# Patient Record
Sex: Male | Born: 1950 | Race: White | Hispanic: No | Marital: Married | Smoking: Current every day smoker
Health system: Southern US, Community
[De-identification: ages and names within clinical notes are randomized; demographics above are authoritative.]

## PROBLEM LIST (undated history)

## (undated) DIAGNOSIS — I82409 Acute embolism and thrombosis of unspecified deep veins of unspecified lower extremity: Secondary | ICD-10-CM

## (undated) DIAGNOSIS — F411 Generalized anxiety disorder: Secondary | ICD-10-CM

## (undated) DIAGNOSIS — I10 Essential (primary) hypertension: Secondary | ICD-10-CM

## (undated) DIAGNOSIS — Z8719 Personal history of other diseases of the digestive system: Secondary | ICD-10-CM

## (undated) DIAGNOSIS — E785 Hyperlipidemia, unspecified: Secondary | ICD-10-CM

## (undated) DIAGNOSIS — G43909 Migraine, unspecified, not intractable, without status migrainosus: Secondary | ICD-10-CM

## (undated) DIAGNOSIS — L989 Disorder of the skin and subcutaneous tissue, unspecified: Secondary | ICD-10-CM

## (undated) DIAGNOSIS — I739 Peripheral vascular disease, unspecified: Secondary | ICD-10-CM

## (undated) DIAGNOSIS — M545 Low back pain, unspecified: Secondary | ICD-10-CM

## (undated) DIAGNOSIS — L03116 Cellulitis of left lower limb: Secondary | ICD-10-CM

## (undated) DIAGNOSIS — L02419 Cutaneous abscess of limb, unspecified: Secondary | ICD-10-CM

## (undated) DIAGNOSIS — K5730 Diverticulosis of large intestine without perforation or abscess without bleeding: Secondary | ICD-10-CM

## (undated) DIAGNOSIS — G4733 Obstructive sleep apnea (adult) (pediatric): Secondary | ICD-10-CM

## (undated) DIAGNOSIS — M1990 Unspecified osteoarthritis, unspecified site: Secondary | ICD-10-CM

## (undated) DIAGNOSIS — T4145XA Adverse effect of unspecified anesthetic, initial encounter: Secondary | ICD-10-CM

## (undated) DIAGNOSIS — J449 Chronic obstructive pulmonary disease, unspecified: Secondary | ICD-10-CM

## (undated) DIAGNOSIS — G4700 Insomnia, unspecified: Secondary | ICD-10-CM

## (undated) DIAGNOSIS — G8929 Other chronic pain: Secondary | ICD-10-CM

## (undated) DIAGNOSIS — I219 Acute myocardial infarction, unspecified: Secondary | ICD-10-CM

## (undated) DIAGNOSIS — K603 Anal fistula: Secondary | ICD-10-CM

## (undated) DIAGNOSIS — T8859XA Other complications of anesthesia, initial encounter: Secondary | ICD-10-CM

## (undated) DIAGNOSIS — L03119 Cellulitis of unspecified part of limb: Secondary | ICD-10-CM

## (undated) DIAGNOSIS — J309 Allergic rhinitis, unspecified: Secondary | ICD-10-CM

## (undated) DIAGNOSIS — Z86010 Personal history of colonic polyps: Secondary | ICD-10-CM

## (undated) DIAGNOSIS — E119 Type 2 diabetes mellitus without complications: Secondary | ICD-10-CM

## (undated) DIAGNOSIS — I2510 Atherosclerotic heart disease of native coronary artery without angina pectoris: Secondary | ICD-10-CM

## (undated) DIAGNOSIS — R011 Cardiac murmur, unspecified: Secondary | ICD-10-CM

## (undated) DIAGNOSIS — F329 Major depressive disorder, single episode, unspecified: Secondary | ICD-10-CM

## (undated) DIAGNOSIS — I509 Heart failure, unspecified: Secondary | ICD-10-CM

## (undated) DIAGNOSIS — I639 Cerebral infarction, unspecified: Secondary | ICD-10-CM

## (undated) HISTORY — DX: Type 2 diabetes mellitus without complications: E11.9

## (undated) HISTORY — DX: Personal history of colonic polyps: Z86.010

## (undated) HISTORY — DX: Acute embolism and thrombosis of unspecified deep veins of unspecified lower extremity: I82.409

## (undated) HISTORY — PX: BACK SURGERY: SHX140

## (undated) HISTORY — DX: Insomnia, unspecified: G47.00

## (undated) HISTORY — DX: Personal history of other diseases of the digestive system: Z87.19

## (undated) HISTORY — DX: Cutaneous abscess of limb, unspecified: L02.419

## (undated) HISTORY — DX: Chronic obstructive pulmonary disease, unspecified: J44.9

## (undated) HISTORY — DX: Anal fistula: K60.3

## (undated) HISTORY — DX: Generalized anxiety disorder: F41.1

## (undated) HISTORY — DX: Hyperlipidemia, unspecified: E78.5

## (undated) HISTORY — DX: Cellulitis of unspecified part of limb: L03.119

## (undated) HISTORY — DX: Acute myocardial infarction, unspecified: I21.9

## (undated) HISTORY — DX: Allergic rhinitis, unspecified: J30.9

## (undated) HISTORY — DX: Disorder of the skin and subcutaneous tissue, unspecified: L98.9

## (undated) HISTORY — DX: Peripheral vascular disease, unspecified: I73.9

## (undated) HISTORY — DX: Morbid (severe) obesity due to excess calories: E66.01

## (undated) HISTORY — DX: Essential (primary) hypertension: I10

## (undated) HISTORY — DX: Major depressive disorder, single episode, unspecified: F32.9

## (undated) HISTORY — DX: Diverticulosis of large intestine without perforation or abscess without bleeding: K57.30

---

## 1983-08-22 HISTORY — PX: ANTERIOR CERVICAL DECOMP/DISCECTOMY FUSION: SHX1161

## 1988-08-21 HISTORY — PX: KNEE ARTHROSCOPY: SHX127

## 1989-04-21 DIAGNOSIS — I639 Cerebral infarction, unspecified: Secondary | ICD-10-CM

## 1989-04-21 HISTORY — DX: Cerebral infarction, unspecified: I63.9

## 1989-04-21 HISTORY — PX: LUMBAR LAMINECTOMY: SHX95

## 1997-08-21 HISTORY — PX: LIGAMENT REPAIR: SHX5444

## 1998-12-27 ENCOUNTER — Ambulatory Visit (HOSPITAL_COMMUNITY): Admission: RE | Admit: 1998-12-27 | Discharge: 1998-12-27 | Payer: Self-pay | Admitting: Orthopedic Surgery

## 1999-03-08 ENCOUNTER — Encounter: Payer: Self-pay | Admitting: Orthopedic Surgery

## 1999-03-08 ENCOUNTER — Ambulatory Visit (HOSPITAL_COMMUNITY): Admission: RE | Admit: 1999-03-08 | Discharge: 1999-03-08 | Payer: Self-pay | Admitting: Orthopedic Surgery

## 1999-11-17 ENCOUNTER — Encounter: Admission: RE | Admit: 1999-11-17 | Discharge: 1999-11-17 | Payer: Self-pay | Admitting: Hematology and Oncology

## 2001-03-20 ENCOUNTER — Encounter: Admission: RE | Admit: 2001-03-20 | Discharge: 2001-03-20 | Payer: Self-pay | Admitting: Internal Medicine

## 2001-03-28 ENCOUNTER — Encounter: Admission: RE | Admit: 2001-03-28 | Discharge: 2001-03-28 | Payer: Self-pay | Admitting: Internal Medicine

## 2001-04-23 ENCOUNTER — Encounter: Admission: RE | Admit: 2001-04-23 | Discharge: 2001-04-23 | Payer: Self-pay | Admitting: Internal Medicine

## 2001-05-02 ENCOUNTER — Ambulatory Visit (HOSPITAL_COMMUNITY): Admission: RE | Admit: 2001-05-02 | Discharge: 2001-05-02 | Payer: Self-pay | Admitting: Internal Medicine

## 2001-05-02 ENCOUNTER — Encounter: Payer: Self-pay | Admitting: Internal Medicine

## 2001-07-12 ENCOUNTER — Encounter: Admission: RE | Admit: 2001-07-12 | Discharge: 2001-07-12 | Payer: Self-pay | Admitting: Internal Medicine

## 2001-07-12 ENCOUNTER — Ambulatory Visit (HOSPITAL_COMMUNITY): Admission: RE | Admit: 2001-07-12 | Discharge: 2001-07-12 | Payer: Self-pay | Admitting: Internal Medicine

## 2001-07-22 ENCOUNTER — Encounter: Payer: Self-pay | Admitting: Emergency Medicine

## 2001-07-22 ENCOUNTER — Inpatient Hospital Stay (HOSPITAL_COMMUNITY): Admission: EM | Admit: 2001-07-22 | Discharge: 2001-07-25 | Payer: Self-pay | Admitting: Emergency Medicine

## 2001-07-22 ENCOUNTER — Encounter: Payer: Self-pay | Admitting: Internal Medicine

## 2001-07-23 ENCOUNTER — Encounter: Payer: Self-pay | Admitting: Internal Medicine

## 2001-07-24 ENCOUNTER — Encounter: Payer: Self-pay | Admitting: Internal Medicine

## 2001-08-02 ENCOUNTER — Encounter: Admission: RE | Admit: 2001-08-02 | Discharge: 2001-08-02 | Payer: Self-pay | Admitting: Internal Medicine

## 2001-09-10 ENCOUNTER — Encounter: Payer: Self-pay | Admitting: *Deleted

## 2001-09-10 ENCOUNTER — Encounter: Admission: RE | Admit: 2001-09-10 | Discharge: 2001-09-10 | Payer: Self-pay | Admitting: *Deleted

## 2001-09-24 ENCOUNTER — Encounter: Payer: Self-pay | Admitting: *Deleted

## 2001-09-24 ENCOUNTER — Encounter: Admission: RE | Admit: 2001-09-24 | Discharge: 2001-09-24 | Payer: Self-pay | Admitting: *Deleted

## 2001-10-24 ENCOUNTER — Emergency Department (HOSPITAL_COMMUNITY): Admission: EM | Admit: 2001-10-24 | Discharge: 2001-10-24 | Payer: Self-pay

## 2001-11-02 ENCOUNTER — Emergency Department (HOSPITAL_COMMUNITY): Admission: EM | Admit: 2001-11-02 | Discharge: 2001-11-02 | Payer: Self-pay

## 2001-11-12 ENCOUNTER — Encounter (HOSPITAL_COMMUNITY): Admission: RE | Admit: 2001-11-12 | Discharge: 2001-11-13 | Payer: Self-pay | Admitting: *Deleted

## 2001-11-12 ENCOUNTER — Encounter: Payer: Self-pay | Admitting: *Deleted

## 2001-11-15 ENCOUNTER — Encounter: Admission: RE | Admit: 2001-11-15 | Discharge: 2001-11-15 | Payer: Self-pay | Admitting: Internal Medicine

## 2001-11-26 ENCOUNTER — Encounter: Payer: Self-pay | Admitting: *Deleted

## 2001-11-26 ENCOUNTER — Ambulatory Visit (HOSPITAL_COMMUNITY): Admission: RE | Admit: 2001-11-26 | Discharge: 2001-11-27 | Payer: Self-pay | Admitting: *Deleted

## 2001-12-25 ENCOUNTER — Emergency Department (HOSPITAL_COMMUNITY): Admission: EM | Admit: 2001-12-25 | Discharge: 2001-12-25 | Payer: Self-pay | Admitting: Emergency Medicine

## 2002-01-08 ENCOUNTER — Encounter: Admission: RE | Admit: 2002-01-08 | Discharge: 2002-01-08 | Payer: Self-pay | Admitting: Internal Medicine

## 2002-01-23 ENCOUNTER — Encounter: Admission: RE | Admit: 2002-01-23 | Discharge: 2002-03-06 | Payer: Self-pay | Admitting: *Deleted

## 2002-04-03 ENCOUNTER — Encounter: Payer: Self-pay | Admitting: *Deleted

## 2002-04-03 ENCOUNTER — Ambulatory Visit (HOSPITAL_COMMUNITY): Admission: RE | Admit: 2002-04-03 | Discharge: 2002-04-03 | Payer: Self-pay | Admitting: *Deleted

## 2002-04-08 ENCOUNTER — Ambulatory Visit (HOSPITAL_COMMUNITY): Admission: RE | Admit: 2002-04-08 | Discharge: 2002-04-09 | Payer: Self-pay | Admitting: *Deleted

## 2002-04-08 ENCOUNTER — Encounter: Payer: Self-pay | Admitting: *Deleted

## 2002-04-29 ENCOUNTER — Encounter: Admission: RE | Admit: 2002-04-29 | Discharge: 2002-04-29 | Payer: Self-pay | Admitting: Internal Medicine

## 2002-06-02 ENCOUNTER — Encounter: Admission: RE | Admit: 2002-06-02 | Discharge: 2002-06-02 | Payer: Self-pay | Admitting: Internal Medicine

## 2002-06-23 ENCOUNTER — Encounter: Admission: RE | Admit: 2002-06-23 | Discharge: 2002-06-23 | Payer: Self-pay | Admitting: Internal Medicine

## 2002-06-30 ENCOUNTER — Encounter: Payer: Self-pay | Admitting: Cardiology

## 2002-06-30 ENCOUNTER — Ambulatory Visit (HOSPITAL_COMMUNITY): Admission: RE | Admit: 2002-06-30 | Discharge: 2002-06-30 | Payer: Self-pay | Admitting: Cardiology

## 2002-10-01 ENCOUNTER — Ambulatory Visit (HOSPITAL_COMMUNITY): Admission: RE | Admit: 2002-10-01 | Discharge: 2002-10-01 | Payer: Self-pay | Admitting: *Deleted

## 2002-10-01 ENCOUNTER — Encounter: Payer: Self-pay | Admitting: *Deleted

## 2002-10-23 ENCOUNTER — Encounter: Payer: Self-pay | Admitting: Neurosurgery

## 2002-10-27 ENCOUNTER — Encounter: Payer: Self-pay | Admitting: Neurosurgery

## 2002-10-27 ENCOUNTER — Inpatient Hospital Stay (HOSPITAL_COMMUNITY): Admission: RE | Admit: 2002-10-27 | Discharge: 2002-10-28 | Payer: Self-pay | Admitting: Neurosurgery

## 2002-12-04 ENCOUNTER — Encounter: Admission: RE | Admit: 2002-12-04 | Discharge: 2002-12-04 | Payer: Self-pay | Admitting: Neurosurgery

## 2002-12-04 ENCOUNTER — Encounter: Payer: Self-pay | Admitting: Neurosurgery

## 2003-02-26 ENCOUNTER — Encounter: Admission: RE | Admit: 2003-02-26 | Discharge: 2003-02-26 | Payer: Self-pay | Admitting: Neurosurgery

## 2003-02-26 ENCOUNTER — Encounter: Payer: Self-pay | Admitting: Neurosurgery

## 2003-04-01 ENCOUNTER — Encounter: Admission: RE | Admit: 2003-04-01 | Discharge: 2003-04-01 | Payer: Self-pay | Admitting: Internal Medicine

## 2003-04-08 ENCOUNTER — Encounter: Admission: RE | Admit: 2003-04-08 | Discharge: 2003-04-08 | Payer: Self-pay | Admitting: Internal Medicine

## 2003-06-01 ENCOUNTER — Encounter: Admission: RE | Admit: 2003-06-01 | Discharge: 2003-06-01 | Payer: Self-pay | Admitting: Internal Medicine

## 2003-09-09 ENCOUNTER — Encounter: Admission: RE | Admit: 2003-09-09 | Discharge: 2003-09-09 | Payer: Self-pay | Admitting: Internal Medicine

## 2003-10-14 ENCOUNTER — Inpatient Hospital Stay (HOSPITAL_COMMUNITY): Admission: EM | Admit: 2003-10-14 | Discharge: 2003-10-15 | Payer: Self-pay | Admitting: Emergency Medicine

## 2003-10-26 ENCOUNTER — Encounter: Admission: RE | Admit: 2003-10-26 | Discharge: 2003-10-26 | Payer: Self-pay | Admitting: Internal Medicine

## 2003-12-10 ENCOUNTER — Encounter: Admission: RE | Admit: 2003-12-10 | Discharge: 2003-12-10 | Payer: Self-pay | Admitting: Internal Medicine

## 2004-06-30 ENCOUNTER — Ambulatory Visit: Payer: Self-pay | Admitting: Internal Medicine

## 2005-04-05 ENCOUNTER — Ambulatory Visit: Payer: Self-pay | Admitting: Internal Medicine

## 2005-04-12 ENCOUNTER — Ambulatory Visit: Payer: Self-pay | Admitting: Internal Medicine

## 2005-06-06 ENCOUNTER — Ambulatory Visit: Payer: Self-pay | Admitting: Internal Medicine

## 2005-07-06 ENCOUNTER — Ambulatory Visit: Payer: Self-pay | Admitting: Internal Medicine

## 2005-07-19 ENCOUNTER — Ambulatory Visit: Payer: Self-pay | Admitting: Internal Medicine

## 2005-07-22 ENCOUNTER — Ambulatory Visit (HOSPITAL_BASED_OUTPATIENT_CLINIC_OR_DEPARTMENT_OTHER): Admission: RE | Admit: 2005-07-22 | Discharge: 2005-07-22 | Payer: Self-pay | Admitting: Internal Medicine

## 2005-07-30 ENCOUNTER — Ambulatory Visit: Payer: Self-pay | Admitting: Internal Medicine

## 2005-08-22 ENCOUNTER — Ambulatory Visit: Payer: Self-pay | Admitting: Internal Medicine

## 2005-09-19 ENCOUNTER — Ambulatory Visit: Payer: Self-pay | Admitting: Internal Medicine

## 2005-10-31 ENCOUNTER — Ambulatory Visit: Payer: Self-pay | Admitting: Internal Medicine

## 2005-12-26 ENCOUNTER — Ambulatory Visit: Payer: Self-pay | Admitting: Internal Medicine

## 2006-06-14 ENCOUNTER — Ambulatory Visit: Payer: Self-pay | Admitting: Internal Medicine

## 2007-06-24 ENCOUNTER — Ambulatory Visit: Payer: Self-pay | Admitting: Internal Medicine

## 2007-06-24 DIAGNOSIS — Z8719 Personal history of other diseases of the digestive system: Secondary | ICD-10-CM | POA: Insufficient documentation

## 2007-06-24 DIAGNOSIS — Z860100 Personal history of colon polyps, unspecified: Secondary | ICD-10-CM

## 2007-06-24 DIAGNOSIS — F329 Major depressive disorder, single episode, unspecified: Secondary | ICD-10-CM

## 2007-06-24 DIAGNOSIS — K5730 Diverticulosis of large intestine without perforation or abscess without bleeding: Secondary | ICD-10-CM

## 2007-06-24 DIAGNOSIS — J4489 Other specified chronic obstructive pulmonary disease: Secondary | ICD-10-CM | POA: Insufficient documentation

## 2007-06-24 DIAGNOSIS — G43009 Migraine without aura, not intractable, without status migrainosus: Secondary | ICD-10-CM | POA: Insufficient documentation

## 2007-06-24 DIAGNOSIS — K603 Anal fistula, unspecified: Secondary | ICD-10-CM

## 2007-06-24 DIAGNOSIS — J309 Allergic rhinitis, unspecified: Secondary | ICD-10-CM

## 2007-06-24 DIAGNOSIS — J449 Chronic obstructive pulmonary disease, unspecified: Secondary | ICD-10-CM

## 2007-06-24 DIAGNOSIS — E785 Hyperlipidemia, unspecified: Secondary | ICD-10-CM | POA: Insufficient documentation

## 2007-06-24 DIAGNOSIS — F411 Generalized anxiety disorder: Secondary | ICD-10-CM

## 2007-06-24 DIAGNOSIS — I739 Peripheral vascular disease, unspecified: Secondary | ICD-10-CM | POA: Insufficient documentation

## 2007-06-24 DIAGNOSIS — G4733 Obstructive sleep apnea (adult) (pediatric): Secondary | ICD-10-CM

## 2007-06-24 DIAGNOSIS — I10 Essential (primary) hypertension: Secondary | ICD-10-CM | POA: Insufficient documentation

## 2007-06-24 DIAGNOSIS — F3289 Other specified depressive episodes: Secondary | ICD-10-CM

## 2007-06-24 DIAGNOSIS — Z8679 Personal history of other diseases of the circulatory system: Secondary | ICD-10-CM | POA: Insufficient documentation

## 2007-06-24 DIAGNOSIS — Z9889 Other specified postprocedural states: Secondary | ICD-10-CM

## 2007-06-24 HISTORY — DX: Anal fistula, unspecified: K60.30

## 2007-06-24 HISTORY — DX: Hyperlipidemia, unspecified: E78.5

## 2007-06-24 HISTORY — DX: Essential (primary) hypertension: I10

## 2007-06-24 HISTORY — DX: Other specified depressive episodes: F32.89

## 2007-06-24 HISTORY — DX: Diverticulosis of large intestine without perforation or abscess without bleeding: K57.30

## 2007-06-24 HISTORY — DX: Peripheral vascular disease, unspecified: I73.9

## 2007-06-24 HISTORY — DX: Allergic rhinitis, unspecified: J30.9

## 2007-06-24 HISTORY — DX: Major depressive disorder, single episode, unspecified: F32.9

## 2007-06-24 HISTORY — DX: Anal fistula: K60.3

## 2007-06-24 HISTORY — DX: Personal history of colonic polyps: Z86.010

## 2007-06-24 HISTORY — DX: Personal history of other diseases of the digestive system: Z87.19

## 2007-06-24 HISTORY — DX: Personal history of colon polyps, unspecified: Z86.0100

## 2007-06-24 HISTORY — DX: Generalized anxiety disorder: F41.1

## 2007-06-26 ENCOUNTER — Telehealth: Payer: Self-pay | Admitting: Internal Medicine

## 2007-06-28 LAB — CONVERTED CEMR LAB
AST: 33 units/L (ref 0–37)
Albumin: 4.1 g/dL (ref 3.5–5.2)
Alkaline Phosphatase: 45 units/L (ref 39–117)
Basophils Absolute: 0.1 10*3/uL (ref 0.0–0.1)
Bilirubin Urine: NEGATIVE
CO2: 27 meq/L (ref 19–32)
Chloride: 105 meq/L (ref 96–112)
Creatinine, Ser: 1 mg/dL (ref 0.4–1.5)
Crystals: NEGATIVE
Eosinophils Absolute: 0.2 10*3/uL (ref 0.0–0.6)
HDL: 22 mg/dL — ABNORMAL LOW (ref >39.0)
MCHC: 34.9 g/dL (ref 30.0–36.0)
MCV: 99.2 fL (ref 78.0–100.0)
Mucus, UA: NEGATIVE
Nitrite: NEGATIVE
PSA: 0.25 ng/mL (ref 0.10–4.00)
Platelets: 171 10*3/uL (ref 150–400)
Potassium: 3.9 meq/L (ref 3.5–5.1)
RBC: 4.49 M/uL (ref 4.22–5.81)
Sodium: 140 meq/L (ref 135–145)
Specific Gravity, Urine: 1.015 (ref 1.000–1.03)
Total Bilirubin: 0.5 mg/dL (ref 0.3–1.2)
Total Protein, Urine: NEGATIVE mg/dL
Total Protein: 7.2 g/dL (ref 6.0–8.3)
Triglycerides: 699 mg/dL (ref 0–149)
Urine Glucose: NEGATIVE mg/dL
Urobilinogen, UA: 0.2 (ref 0.0–1.0)

## 2007-07-01 ENCOUNTER — Ambulatory Visit: Payer: Self-pay

## 2007-07-01 ENCOUNTER — Encounter: Payer: Self-pay | Admitting: Internal Medicine

## 2007-07-05 ENCOUNTER — Encounter: Payer: Self-pay | Admitting: Internal Medicine

## 2007-07-05 ENCOUNTER — Encounter (INDEPENDENT_AMBULATORY_CARE_PROVIDER_SITE_OTHER): Payer: Self-pay | Admitting: *Deleted

## 2007-08-08 ENCOUNTER — Ambulatory Visit: Payer: Self-pay | Admitting: *Deleted

## 2007-08-28 ENCOUNTER — Ambulatory Visit (HOSPITAL_COMMUNITY): Admission: RE | Admit: 2007-08-28 | Discharge: 2007-08-28 | Payer: Self-pay | Admitting: *Deleted

## 2007-08-28 ENCOUNTER — Ambulatory Visit: Payer: Self-pay | Admitting: *Deleted

## 2007-09-26 ENCOUNTER — Ambulatory Visit: Payer: Self-pay | Admitting: *Deleted

## 2007-11-26 ENCOUNTER — Telehealth: Payer: Self-pay | Admitting: Internal Medicine

## 2008-04-16 ENCOUNTER — Ambulatory Visit: Payer: Self-pay | Admitting: *Deleted

## 2008-06-25 ENCOUNTER — Ambulatory Visit: Payer: Self-pay | Admitting: Internal Medicine

## 2008-06-29 ENCOUNTER — Telehealth (INDEPENDENT_AMBULATORY_CARE_PROVIDER_SITE_OTHER): Payer: Self-pay | Admitting: *Deleted

## 2008-06-29 LAB — CONVERTED CEMR LAB
AST: 48 units/L — ABNORMAL HIGH (ref 0–37)
Alkaline Phosphatase: 63 units/L (ref 39–117)
Bilirubin Urine: NEGATIVE
Bilirubin, Direct: 0.2 mg/dL (ref 0.0–0.3)
CO2: 29 meq/L (ref 19–32)
Chloride: 101 meq/L (ref 96–112)
Eosinophils Relative: 2.1 % (ref 0.0–5.0)
GFR calc Af Amer: 99 mL/min
Glucose, Bld: 120 mg/dL — ABNORMAL HIGH (ref 70–99)
HDL: 35.8 mg/dL — ABNORMAL LOW (ref >39.0)
Hemoglobin, Urine: NEGATIVE
Leukocytes, UA: NEGATIVE
Lymphocytes Relative: 31.8 % (ref 12.0–46.0)
Monocytes Absolute: 0.4 10*3/uL (ref 0.1–1.0)
Monocytes Relative: 4.3 % (ref 3.0–12.0)
Neutrophils Relative %: 61.7 % (ref 43.0–77.0)
Nitrite: NEGATIVE
PSA: 0.3 ng/mL (ref 0.10–4.00)
Platelets: 173 10*3/uL (ref 150–400)
Potassium: 4.2 meq/L (ref 3.5–5.1)
RDW: 12.5 % (ref 11.5–14.6)
Sodium: 139 meq/L (ref 135–145)
Total CHOL/HDL Ratio: 5.8
Total Protein: 7.7 g/dL (ref 6.0–8.3)
Triglycerides: 525 mg/dL (ref 0–149)
Urobilinogen, UA: 0.2 (ref 0.0–1.0)
WBC: 8.6 10*3/uL (ref 4.5–10.5)

## 2008-07-21 ENCOUNTER — Ambulatory Visit: Payer: Self-pay | Admitting: Internal Medicine

## 2008-07-21 DIAGNOSIS — E119 Type 2 diabetes mellitus without complications: Secondary | ICD-10-CM

## 2008-08-25 ENCOUNTER — Ambulatory Visit: Payer: Self-pay | Admitting: Internal Medicine

## 2008-08-26 LAB — CONVERTED CEMR LAB
BUN: 9 mg/dL (ref 6–23)
Calcium: 9.9 mg/dL (ref 8.4–10.5)
Direct LDL: 118.9 mg/dL
GFR calc Af Amer: 88 mL/min
Glucose, Bld: 156 mg/dL — ABNORMAL HIGH (ref 70–99)
HDL: 27.9 mg/dL — ABNORMAL LOW (ref >39.0)
Sodium: 141 meq/L (ref 135–145)
Triglycerides: 377 mg/dL (ref 0–149)
VLDL: 75 mg/dL — ABNORMAL HIGH (ref 0–40)

## 2008-09-02 ENCOUNTER — Telehealth: Payer: Self-pay | Admitting: Internal Medicine

## 2008-09-02 ENCOUNTER — Ambulatory Visit: Payer: Self-pay | Admitting: Internal Medicine

## 2008-11-02 ENCOUNTER — Ambulatory Visit: Payer: Self-pay | Admitting: Internal Medicine

## 2008-12-17 ENCOUNTER — Telehealth (INDEPENDENT_AMBULATORY_CARE_PROVIDER_SITE_OTHER): Payer: Self-pay | Admitting: *Deleted

## 2010-01-21 ENCOUNTER — Telehealth: Payer: Self-pay | Admitting: Internal Medicine

## 2010-01-24 ENCOUNTER — Ambulatory Visit: Payer: Self-pay | Admitting: Internal Medicine

## 2010-01-24 DIAGNOSIS — L989 Disorder of the skin and subcutaneous tissue, unspecified: Secondary | ICD-10-CM

## 2010-01-24 DIAGNOSIS — G4700 Insomnia, unspecified: Secondary | ICD-10-CM

## 2010-01-24 HISTORY — DX: Insomnia, unspecified: G47.00

## 2010-01-24 HISTORY — DX: Disorder of the skin and subcutaneous tissue, unspecified: L98.9

## 2010-01-24 LAB — CONVERTED CEMR LAB
Albumin: 4.6 g/dL (ref 3.5–5.2)
Alkaline Phosphatase: 51 units/L (ref 39–117)
Basophils Absolute: 0.1 10*3/uL (ref 0.0–0.1)
Bilirubin Urine: NEGATIVE
Bilirubin, Direct: 0.1 mg/dL (ref 0.0–0.3)
CO2: 27 meq/L (ref 19–32)
Calcium: 9.6 mg/dL (ref 8.4–10.5)
Creatinine, Ser: 0.9 mg/dL (ref 0.4–1.5)
Direct LDL: 101.9 mg/dL
Eosinophils Absolute: 0.3 10*3/uL (ref 0.0–0.7)
Glucose, Bld: 89 mg/dL (ref 70–99)
HDL: 30.8 mg/dL — ABNORMAL LOW (ref >39.00)
Ketones, ur: NEGATIVE mg/dL
Leukocytes, UA: NEGATIVE
Lymphocytes Relative: 29.2 % (ref 12.0–46.0)
MCHC: 34.8 g/dL (ref 30.0–36.0)
Microalb Creat Ratio: 2.1 mg/g (ref 0.0–30.0)
Neutrophils Relative %: 61.1 % (ref 43.0–77.0)
PSA: 0.19 ng/mL (ref 0.10–4.00)
Platelets: 112 10*3/uL — ABNORMAL LOW (ref 150.0–400.0)
RDW: 13.7 % (ref 11.5–14.6)
TSH: 2.89 microintl units/mL (ref 0.35–5.50)
VLDL: 83.4 mg/dL — ABNORMAL HIGH (ref 0.0–40.0)
pH: 6 (ref 5.0–8.0)

## 2010-01-25 LAB — CONVERTED CEMR LAB: Vit D, 25-Hydroxy: 25 ng/mL — ABNORMAL LOW (ref 30–89)

## 2010-02-23 ENCOUNTER — Encounter: Payer: Self-pay | Admitting: Internal Medicine

## 2010-03-07 ENCOUNTER — Telehealth: Payer: Self-pay | Admitting: Internal Medicine

## 2010-03-28 ENCOUNTER — Ambulatory Visit: Payer: Self-pay | Admitting: Internal Medicine

## 2010-03-28 DIAGNOSIS — L02419 Cutaneous abscess of limb, unspecified: Secondary | ICD-10-CM

## 2010-03-28 DIAGNOSIS — L03119 Cellulitis of unspecified part of limb: Secondary | ICD-10-CM

## 2010-03-28 HISTORY — DX: Cellulitis of unspecified part of limb: L02.419

## 2010-04-05 ENCOUNTER — Encounter (HOSPITAL_BASED_OUTPATIENT_CLINIC_OR_DEPARTMENT_OTHER): Admission: RE | Admit: 2010-04-05 | Discharge: 2010-05-23 | Payer: Self-pay | Admitting: General Surgery

## 2010-09-20 NOTE — Progress Notes (Signed)
Summary: denied med  Phone Note Call from Patient   Caller: Patient Summary of Call: Pt left vm requesting call back. want to know why med was denied. Called pt and let him know need to see md have'nt seen him since 11/02/08. Transferred to schedulers to make appt Initial call taken by: Orlan Leavens,  January 21, 2010 2:26 PM

## 2010-09-20 NOTE — Progress Notes (Signed)
Summary: Temazepam  Phone Note Call from Patient Call back at Home Phone [phone]   Summary of Call: Patient left message on triage requesting to go back on a med that was stopped, did not name the med. Lucious Groves CMA  March 07, 2010 1:26 PM   Left message on machine to call back to office. Lucious Groves CMA  March 07, 2010 1:48 PM   Follow-up for Phone Call        Patient rtn call to let me know that he was calling in regards to Temazepam. He has discovered that he cannot do without it and needs refill prescription. Please advise. Follow-up by: Lucious Groves CMA,  March 07, 2010 3:16 PM  Additional Follow-up for Phone Call Additional follow up Details #1::        done hardcopy to LIM side B - dahlia  Additional Follow-up by: Corwin Levins MD,  March 07, 2010 4:42 PM    Additional Follow-up for Phone Call Additional follow up Details #2::    Patient notified and he uses [facility]. Removed [facility] per patient request. Follow-up by: Lucious Groves CMA,  March 07, 2010 4:47 PM  New/Updated Medications: TEMAZEPAM 30 MG CAPS (TEMAZEPAM) 1 by mouth at bedtime as needed Prescriptions: TEMAZEPAM 30 MG CAPS (TEMAZEPAM) 1 by mouth at bedtime as needed  #30 x 2   Entered and Authorized by:   Corwin Levins MD   Signed by:   Corwin Levins MD on 03/07/2010   Method used:   Print then Give to Patient   RxID:   [id number]

## 2010-09-20 NOTE — Assessment & Plan Note (Signed)
Summary: FU ON MEDS /NWS   Vital Signs:  Patient profile:   60 year old male Height:      69 inches Weight:      296.75 pounds BMI:     43.98 O2 Sat:      95 % on Room air Temp:     97.9 degrees F oral Pulse rate:   86 / minute BP sitting:   140 / 80  (left arm) Cuff size:   large  Vitals Entered ByZella Ball Ewing (January 24, 2010 9:24 AM)  O2 Flow:  Room air  CC: Followup on Medications/RE, Back Pain   CC:  Followup on Medications/RE and Back Pain.  History of Present Illness: overall doing well, lost wt about 8 to 10 lbs  in the past yr with better diet mostly;  Pt denies CP, sob, doe, wheezing, orthopnea, pnd, worsening LE edema, palps, dizziness or syncope  Pt denies new neuro symptoms such as headache, facial or extremity weakness Pt denies polydipsia, polyuria, or low sugar symptoms such as shakiness improved with eating.  Overall good compliance with meds, trying to follow low chol, DM diet, wt stable, little excercise however  Temazepam not working as well as it used to.  Denies worsening depressive symtpoms or suicida;l ideation, or panic.  Some increased stress recently - nephew lost his job and cant afford to help him.   Thinks getting to sleep is ok for now, might consider different med in the future.   Also Dr hall/derm in the past  - has several new lesions    Preventive Screening-Counseling & Management      Drug Use:  no.    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 1po Once Daily 2)  Benazepril Hcl 40 Mg Tabs (Benazepril Hcl) .... Take 1 Tablet By Mouth Once A Day 3)  Labetalol Hcl 300 Mg Tabs (Labetalol Hcl) .... Take 1 Tablet By Mouth Twice A Day 4)  Simvastatin 80 Mg Tabs (Simvastatin) .Marland Kitchen.. 1 Po Once Daily 5)  Temazepam 30 Mg  Caps (Temazepam) .Marland Kitchen.. 1 By Mouth At Bedtime As Needed - Needs Return Office Visit For Further Refills 6)  Adult Aspirin Ec Low Strength 81 Mg Tbec (Aspirin) .Marland Kitchen.. 1 Po Once Daily 7)  Glimepiride 4 Mg Tabs (Glimepiride) .... 2 Po Once Daily 8)  Metformin Hcl 500 Mg Tabs (Metformin Hcl) .... 4 By Mouth Once Daily  Current Medications (verified): 1)  Amlodipine Besylate 10 Mg Tabs (Amlodipine Besylate) .Marland Kitchen.. 1po Once Daily 2)  Benazepril Hcl 40 Mg Tabs (Benazepril Hcl) .... Take 1 Tablet By Mouth Once A Day 3)  Labetalol Hcl 300 Mg Tabs (Labetalol Hcl) .... Take 1 Tablet By Mouth  Twice A Day 4)  Simvastatin 80 Mg Tabs (Simvastatin) .Marland Kitchen.. 1 Po Once Daily 5)  Adult Aspirin Ec Low Strength 81 Mg Tbec (Aspirin) .Marland Kitchen.. 1 Po Once Daily 6)  Glimepiride 4 Mg Tabs (Glimepiride) .... 2 Po Once Daily 7)  Metformin Hcl 500 Mg Tabs (Metformin Hcl) .... 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 c-spine fusion after job-related injury 1985 bilat knee surgury 1990 hand surgury 1999 Lumbar laminectomy  Family History: Reviewed history from 06/24/2007 and no changes required. Family History Lung cancer - mother father with copd, died with suicide both maternal grandparents with heart dz  Social History: Reviewed history from 06/25/2008 and no changes required. 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         all otherwise negative per pt -    has stable LLE claudication with ambulation  Physical Exam  General:  alert and overweight-appearing.   Head:  normocephalic and atraumatic.   Eyes:  vision grossly intact, pupils equal, and pupils round.   Ears:  R ear normal and L ear normal.   Nose:  no external deformity and no nasal discharge.   Mouth:  no gingival abnormalities and pharynx pink and moist.   Neck:  supple and no masses.   Lungs:  normal respiratory effort, R decreased breath sounds, and L decreased  breath sounds.   Heart:  normal rate and regular rhythm.   Abdomen:  soft, non-tender, and normal bowel sounds.   Msk:  no joint tenderness and no joint swelling.   Extremities:  trace edema bilat, no erythema, no ulcers Neurologic:  cranial nerves II-XII intact and strength normal in all extremities.   Skin:  color normal.  , has several ? keratotic lesions to the legs, mid abdomen Psych:  not depressed appearing and moderately anxious.     Impression & Recommendations:  Problem # 1:  Preventive Health Care (ICD-V70.0)  Overall doing well, age appropriate education and counseling updated and referral for appropriate preventive services done unless declined, immunizations up to date or declined, diet counseling done if overweight, urged to quit smoking if smokes , most recent labs reviewed and current ordered if appropriate, ecg reviewed or declined (interpretation per ECG scanned in the EMR if done); information regarding Medicare Prevention requirements given if appropriate; speciality  referrals updated as appropriate   Orders: Gastroenterology Referral (GI) TLB-BMP (Basic Metabolic Panel-BMET) (80048-METABOL) TLB-CBC Platelet - w/Differential (85025-CBCD) TLB-Hepatic/Liver Function Pnl (80076-HEPATIC) TLB-Lipid Panel (80061-LIPID) TLB-TSH (Thyroid Stimulating Hormone) (84443-TSH) TLB-PSA (Prostate Specific Antigen) (84153-PSA) TLB-Udip ONLY (81003-UDIP) T-Vitamin D (25-Hydroxy) (84166-06301)  Problem # 2:  INSOMNIA-SLEEP DISORDER-UNSPEC (ICD-780.52)  The following medications were removed from the medication list:    Temazepam 30 Mg Caps (Temazepam) .Marland Kitchen... 1 by mouth at bedtime as needed - needs return office visit for further refills ok for now meds for now per pt  Problem # 3:  DIABETES MELLITUS, TYPE II (ICD-250.00)  His updated medication list for this problem includes:    Benazepril Hcl 40 Mg Tabs (Benazepril hcl) .Marland Kitchen... Take 1 tablet by mouth once a day    Adult Aspirin Ec  Low Strength 81 Mg Tbec (Aspirin) .Marland Kitchen... 1 po once daily    Glimepiride 4 Mg Tabs (Glimepiride) .Marland Kitchen... 2 po once daily    Metformin Hcl 500 Mg Tabs (Metformin hcl) .Marland KitchenMarland KitchenMarland KitchenMarland Kitchen 4 by mouth once daily  Labs Reviewed: Creat: 1.1 (08/25/2008)    Reviewed HgBA1c results: 7.6 (08/25/2008)  7.8 (06/25/2008) stable overall by hx and exam, ok to continue meds/tx as is, Pt to cont DM diet, excercise, wt loss efforts; to check labs today   Orders: TLB-Microalbumin/Creat Ratio, Urine (82043-MALB) TLB-A1C / Hgb A1C (Glycohemoglobin) (83036-A1C)  Problem # 4:  HYPERTENSION (ICD-401.9)  His updated medication list for this problem includes:    Amlodipine Besylate 10 Mg Tabs (Amlodipine besylate) .Marland Kitchen... 1po once daily    Benazepril Hcl 40 Mg Tabs (Benazepril hcl) .Marland Kitchen... Take 1 tablet by mouth once a day    Labetalol Hcl 300 Mg Tabs (Labetalol hcl) .Marland Kitchen... Take 1 tablet by mouth twice a day  BP today: 140/80 Prior BP: 134/72 (11/02/2008)  Labs Reviewed: K+: 4.1 (08/25/2008) Creat: : 1.1 (08/25/2008)   Chol: 202 (08/25/2008)   HDL: 27.9 (08/25/2008)   LDL: DEL (08/25/2008)   TG: 377 (08/25/2008) borderline elev today - Continue all previous medications as before this visit , pt declines change today  Problem # 5:  HYPERLIPIDEMIA (ICD-272.4)  His updated medication list for this problem includes:    Simvastatin 80 Mg Tabs (Simvastatin) .Marland Kitchen... 1 po once daily  Labs Reviewed: SGOT: 48 (06/25/2008)   SGPT: 42 (06/25/2008)   HDL:27.9 (08/25/2008), 35.8 (06/25/2008)  LDL:DEL (08/25/2008), DEL (06/25/2008)  Chol:202 (08/25/2008), 208 (06/25/2008)  Trig:377 (08/25/2008), 525 (06/25/2008) stable overall by hx and exam, ok to continue meds/tx as is . Pt to continue diet efforts, good med tolerance; to check labs - goal LDL less than 70   Problem # 6:  SKIN LESION (ICD-709.9)  ok to refer back to Dr Margo Aye   Orders: Dermatology Referral (Derma)  Complete Medication List: 1)  Amlodipine Besylate 10 Mg Tabs  (Amlodipine besylate) .Marland Kitchen.. 1po once daily 2)  Benazepril Hcl 40 Mg Tabs (Benazepril hcl) .... Take 1 tablet by mouth once a day 3)  Labetalol Hcl 300 Mg Tabs (Labetalol hcl) .... Take 1 tablet by mouth twice a day 4)  Simvastatin 80 Mg Tabs (Simvastatin) .Marland Kitchen.. 1 po once daily 5)  Adult Aspirin Ec Low Strength 81 Mg Tbec (Aspirin) .Marland Kitchen.. 1 po once daily 6)  Glimepiride 4 Mg Tabs (Glimepiride) .... 2 po once daily 7)  Metformin Hcl 500 Mg Tabs (Metformin hcl) .... 4 by mouth once daily  Patient Instructions: 1)  ok to stop the temazepam 2)  you had the tetanus and pneumonia shots today  3)  Please go to the Lab in the basement for your blood and/or urine tests today 4)  You will be contacted about the referral(s) to: colonoscopy, and Dr Margo Aye - dermatology 5)  Please schedule a follow-up appointment in 6 months.  Appended Document: Immunization Entry      Immunizations Administered:  Tetanus Vaccine:    Vaccine Type: Tdap    Site: left deltoid    Mfr: GlaxoSmithKline    Dose: 0.5 ml    Route: IM    Given by: Robin Ewing    Exp. Date: 06/16/2010    Lot #: [id number]    VIS given: 07/09/07 version given January 24, 2010.  Pneumonia Vaccine:    Vaccine Type: Pneumovax    Site: right deltoid    Mfr: Merck    Dose: 0.5 ml    Route: IM    Given by: Robin Ewing    Exp. Date: 06/15/2011    Lot #: [id number]    VIS given: 03/18/96 version given January 24, 2010.

## 2010-09-20 NOTE — Consult Note (Signed)
Summary: Duard Larsen MD  Duard Larsen MD   Imported By: Sherian Rein 03/14/2010 11:54:29  _____________________________________________________________________  External Attachment:    Type:   Image     Comment:   External Document

## 2010-09-20 NOTE — Assessment & Plan Note (Signed)
Summary: INSECT BITE/NWS   Vital Signs:  Patient profile:   60 year old male Height:      73 inches Weight:      299 pounds BMI:     39.59 O2 Sat:      95 % on Room air Temp:     98.2 degrees F oral Pulse rate:   91 / minute BP sitting:   130 / 72  (left arm) Cuff size:   large  Vitals Entered By: Zella Ball Ewing CMA (AAMA) (March 28, 2010 4:18 PM)  O2 Flow:  Room air CC: Bites on legs, headache/RE   CC:  Bites on legs and headache/RE.  History of Present Illness: here with 3 day new onset gradual but rapid increased area red, swelling, tender and drainage spontaneous last night with some mild pain relief;  no chills and no red streaks;  Pt denies CP, sob, doe, wheezing, orthopnea, pnd, worsening LE edema, palps, dizziness or syncope  Pt denies new neuro symptoms such as headache, facial or extremity weakness  Has occasional headache.  No high fever or chills.  Pt denies polydipsia, polyuria, or low sugar symptoms such as shakiness improved with eating.  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 1po Once Daily 2)  Benazepril Hcl 40 Mg Tabs (Benazepril Hcl) .... Take 1 Tablet By Mouth Once A Day 3)  Labetalol Hcl 300 Mg Tabs (Labetalol Hcl) .... Take 1 Tablet By Mouth Twice A Day 4)  Simvastatin 80 Mg Tabs (Simvastatin) .Marland Kitchen.. 1 Po Once Daily 5)  Adult Aspirin Ec Low Strength 81 Mg Tbec (Aspirin) .Marland Kitchen.. 1 Po Once Daily 6)  Glimepiride 4 Mg Tabs (Glimepiride) .... 2 Po Once Daily 7)  Metformin Hcl 500 Mg Tabs (Metformin Hcl) .... 4 By Mouth Once Daily 8)  Temazepam 30 Mg Caps (Temazepam) .Marland Kitchen.. 1 By Mouth At Bedtime As Needed  Current Medications (verified): 1)  Amlodipine Besylate 10 Mg Tabs (Amlodipine Besylate) .Marland Kitchen.. 1po Once Daily 2)  Benazepril Hcl 40 Mg Tabs (Benazepril Hcl) .... Take 1 Tablet By Mouth Once A Day 3)  Labetalol Hcl 300 Mg Tabs (Labetalol Hcl) .... Take 1 Tablet By Mouth Twice A Day 4)  Simvastatin 80 Mg Tabs (Simvastatin) .Marland Kitchen.. 1 Po Once Daily 5)  Adult Aspirin Ec Low Strength 81 Mg Tbec (Aspirin) .Marland Kitchen.. 1 Po Once Daily 6)  Glimepiride 4 Mg Tabs (Glimepiride) .... 2 Po Once Daily 7)  Metformin Hcl 500 Mg Tabs (  Metformin Hcl) .... 4 By Mouth Once Daily 8)  Temazepam 30 Mg Caps (Temazepam) .Marland Kitchen.. 1 By Mouth At Bedtime As Needed 9)  Doxycycline Hyclate 100 Mg Caps (Doxycycline Hyclate) .Marland Kitchen.. 1 By Mouth Two Times A Day 10)  Oxycodone Hcl 5 Mg Tabs (Oxycodone Hcl) .Marland Kitchen.. 1po Q 6 Hrs As Needed Pain  Allergies (verified): No Known Drug Allergies  Past History:  Past Medical History: Last updated: 01/24/2010 Hyperlipidemia Hypertension OSA -  Cerebrovascular accident, hx of COPD - Dr Maple Hudson Depression Allergic rhinitis Anxiety chronic constipation h/o rectal fistula Diverticulosis, colon Colonic polyps, hx of migraine Peripheral vascular disease - LLE 100% - non surgical candidate per vascular surgury Diabetes  mellitus, type II  Past Surgical History: Last updated: 06/24/2007 c-spine fusion after job-related injury 1985 bilat knee surgury 1990 hand surgury 1999 Lumbar laminectomy  Social History: Last updated: 01/24/2010 Current Smoker Alcohol use-no 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  , not ill appearing Head:  normocephalic and atraumatic.   Eyes:  vision grossly intact, pupils equal, and pupils round.   Ears:  R ear normal and L ear normal.   Nose:  no external deformity and no nasal discharge.   Mouth:  no gingival abnormalities and pharynx pink and moist.   Neck:  supple and no masses.   Lungs:  normal respiratory effort and normal breath sounds.   Heart:  normal rate and regular rhythm.   Extremities:  no edema, no erythema  Skin:  left post calf with 3-4 cm area red, tender, swelling and small drainage with mild fluctuance;  no red streaks or more proximal red, swelling   Impression & Recommendations:  Problem # 1:  CELLULITIS AND ABSCESS OF LEG EXCEPT FOOT (ICD-682.6)  left calf - for pain med, doxy course, and refer to gen surg for appt tomorrow/asap  His updated medication list for this problem includes:    Doxycycline Hyclate 100 Mg Caps (Doxycycline hyclate) .Marland Kitchen... 1 by mouth two times a day  Orders: Surgical Referral (Surgery)  Problem # 2:  DIABETES MELLITUS, TYPE II (ICD-250.00)  His updated medication list for this problem includes:    Benazepril Hcl 40 Mg Tabs (Benazepril hcl) .Marland Kitchen... Take 1 tablet by mouth once a day    Adult Aspirin Ec Low Strength 81 Mg Tbec (Aspirin) .Marland Kitchen... 1 po once daily    Glimepiride 4 Mg Tabs (Glimepiride) .Marland Kitchen... 2 po once daily    Metformin Hcl 500 Mg Tabs (Metformin hcl) .Marland KitchenMarland KitchenMarland KitchenMarland Kitchen 4 by mouth once  daily  Labs Reviewed: Creat: 0.9 (01/24/2010)    Reviewed HgBA1c results: 6.0 (01/24/2010)  7.6 (08/25/2008) stable overall by hx and exam, ok to continue meds/tx as is   Problem # 3:  HYPERTENSION (ICD-401.9)  His updated medication list for this problem includes:    Amlodipine Besylate 10 Mg Tabs (Amlodipine besylate) .Marland Kitchen... 1po once daily    Benazepril Hcl 40 Mg Tabs (Benazepril hcl) .Marland Kitchen... Take 1 tablet by mouth once a day    Labetalol Hcl 300 Mg Tabs (Labetalol hcl) .Marland Kitchen... Take 1 tablet by mouth twice a day  BP today: 130/72 Prior BP: 140/80 (01/24/2010)  Labs Reviewed: K+: 4.2 (01/24/2010) Creat: : 0.9 (01/24/2010)   Chol: 186 (01/24/2010)  HDL: 30.80 (01/24/2010)   LDL: DEL (08/25/2008)   TG: 417.0 (01/24/2010) stable overall by hx and exam, ok to continue meds/tx as is   Complete Medication List: 1)  Amlodipine Besylate 10 Mg Tabs (Amlodipine besylate) .Marland Kitchen.. 1po once daily 2)  Benazepril Hcl 40 Mg Tabs (Benazepril hcl) .... Take 1 tablet by mouth once a day 3)  Labetalol Hcl 300 Mg Tabs (Labetalol hcl) .... Take 1 tablet by mouth twice a day 4)  Simvastatin 80 Mg Tabs (Simvastatin) .Marland Kitchen.. 1 po once daily 5)  Adult Aspirin Ec Low Strength 81 Mg Tbec (Aspirin) .Marland Kitchen.. 1 po once daily 6)  Glimepiride 4 Mg Tabs (Glimepiride) .... 2 po once daily 7)  Metformin Hcl 500 Mg Tabs (Metformin hcl) .... 4 by mouth once daily 8)  Temazepam 30 Mg Caps (Temazepam) .Marland Kitchen.. 1 by mouth at bedtime as needed 9)  Doxycycline Hyclate 100 Mg Caps (Doxycycline hyclate) .Marland Kitchen.. 1 by mouth two times a day 10)  Oxycodone Hcl 5 Mg Tabs (Oxycodone hcl) .Marland Kitchen.. 1po q 6 hrs as needed pain  Patient Instructions: 1)  Please take all new medications as prescribed 2)  Continue all previous medications as before this visit  3)  You will be contacted about the referral(s) to: Gen Surgury for tomorrow 4)  Please schedule a follow-up appointment as needed. Prescriptions: OXYCODONE HCL 5 MG TABS (OXYCODONE HCL) 1po q 6 hrs  as needed pain  #50 x 0   Entered and Authorized by:   Corwin Levins MD   Signed by:   Corwin Levins MD on 03/28/2010   Method used:   Print then Give to Patient   RxID:   [id number] DOXYCYCLINE HYCLATE 100 MG CAPS (DOXYCYCLINE HYCLATE) 1 by mouth two times a day  #20 x 0   Entered and Authorized by:   Corwin Levins MD   Signed by:   Corwin Levins MD on 03/28/2010   Method used:   Print then Give to Patient   RxID:   [id number]

## 2010-10-25 ENCOUNTER — Telehealth: Payer: Self-pay | Admitting: Internal Medicine

## 2010-11-01 NOTE — Progress Notes (Signed)
Summary: ALT med  Phone Note Call from Patient Call back at Home Phone [phone]   Caller: Patient Summary of Call: Pt called stating that Rx forTemezapam is too expensive, $40 per month for him and his wife Eric [name]). Pt is requesting alternative medication. Initial call taken by: Margaret Pyle, CMA,  October 25, 2010 1:50 PM  Follow-up for Phone Call        ok to change to [facility] as needed  - done hardcopy to LIM side B - dahlia  Follow-up by: Corwin Levins MD,  October 25, 2010 5:33 PM  Additional Follow-up for Phone Call Additional follow up Details #1::        Rx faxed to [facility] [address] per pt req Additional Follow-up by: Margaret Pyle, CMA,  October 26, 2010 8:02 AM    New/Updated Medications: ZOLPIDEM TARTRATE 12.5 MG CR-TABS (ZOLPIDEM TARTRATE) 1 by mouth at bedtime as needed Prescriptions: ZOLPIDEM TARTRATE 12.5 MG CR-TABS (ZOLPIDEM TARTRATE) 1 by mouth at bedtime as needed  #30 x 2   Entered and Authorized by:   Corwin Levins MD   Signed by:   Corwin Levins MD on 10/25/2010   Method used:   Print then Give to Patient   RxID:   [id number]

## 2010-11-03 LAB — GLUCOSE, CAPILLARY: Glucose-Capillary: 185 mg/dL — ABNORMAL HIGH (ref 70–99)

## 2011-01-03 NOTE — Procedures (Signed)
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   HISTORY:  Diabetes:  No.  Cardiac:  Yes.  Hypertension:  Yes.  Smoking:  Pack and a half per day.  Previous Surgery:  Occluded left superficial femoral artery and right  superficial femoral artery stenosis by previous angiogram performed  08/28/2007 by Dr. Madilyn Fireman.   RESTING SYSTOLIC PRESSURES: (ABI)                          RIGHT                LEFT  Brachial:               112                  118  Anterior tibial:        130                  60  Posterior tibial:       142 (>1.0)           72 (0.61)  Peroneal:  DOPPLER WAVEFORM ANALYSIS:  Anterior tibial:        Triphasic            Monophasic  Posterior tibial:       Triphasic            Monophasic  Peroneal:   PREVIOUS ABI'S:  Date:  07/01/2007  RIGHT:  >1.0  LEFT:  0.58   IMPRESSION:  ABIs are stable from previous study bilaterally.   ___________________________________________  P. Liliane Bade, M.D.   MC/MEDQ  D:  04/16/2008  T:  04/16/2008  Job:  [id number]

## 2011-01-03 NOTE — Assessment & Plan Note (Signed)
OFFICE VISIT   CASANOVA, SCHURMAN  DOB:  [birth year]                                       09/26/2007  ZOXWR#:[id number]   Patient return to the office today to discuss the results of his lower  extremity arteriography.  He was initially seen in consultation on  08/08/07 with left lower extremity claudication.  The left ABI measured  0.68 and right ABI 1.1.   Arteriography was carried out on 08/28/07.  Revealed long segment left  superficial femoral artery occlusion with reconstitution of the  popliteal artery at the knee joint.  Also, a 40-50% stenosis of the  right superficial femoral artery.   Patient's symptoms at present are that of limiting claudication.  No  night pain or rest pain.  Denies nonhealing ulceration.   PHYSICAL EXAMINATION:  His BP is 161/101, pulse 95 per minute and  regular, respirations 18 per minute, O2 sat 97%.  Lower extremity  evaluation reveals an intact femoral pulse in the left, no palpable  posterior tibial or dorsalis pedis pulses.  Moderate dependent rubor.   I have had a long talk with the patient and his wife about these current  findings.  He has underlying COPD and sleep apnea.  I do think he would  be a relatively high risk operative case and therefore have recommended  we continue with conservative medical therapy.  Instructed regarding  smoking cessation and regular exercise and weight loss.  To return in  six months with a lower extremity Doppler evaluation.   Balinda Quails, M.D.  Electronically Signed   PGH/MEDQ  D:  09/26/2007  T:  09/30/2007  Job:  [id number]

## 2011-01-03 NOTE — Assessment & Plan Note (Signed)
OFFICE VISIT   Eric [name], Eric [name]  DOB:  September 17, 1950                                       04/16/2008  UEAVW#:[id number]   The patient returned to the office today for continued followup of his  claudication.  He has previously undergone arteriography which reveals a  long segment occlusion of the left superficial femoral artery.  He  continues to complain of left calf claudication.  This occurs at one to  two blocks of ambulation.   Unfortunately he is continuing to smoke cigarettes about 1 to 1-1/2  packs daily.   Lower extremity arterial Dopplers reveal ankle brachial indices 1.0 on  the right and 0.61 on the left.   PHYSICAL EXAMINATION:  On evaluation he is obese 60 year old gentleman.  Alert and oriented.  No acute distress.  BP 132/83, pulse 82 per minute.  Lower extremity evaluation reveals intact femoral pulses bilaterally.  His right lower extremity reveals 1+ popliteal, posterior tibial and  dorsalis pedis.  Left lower extremity absent popliteal, posterior tibial  and dorsalis pedis pulses.  No ischemic skin changes noted.   Unfortunately the patient does continue to smoke and I have again  counseled him regarding this problem.  His claudication seems quite  stable at present and he is tolerating this reasonably well.  With his  underlying COPD and sleep apnea I do not feel that surgery would be in  his best interest.  He will continue to exercise regularly and work on  his smoking habit.  To return p.r.n.   Balinda Quails, M.D.  Electronically Signed   PGH/MEDQ  D:  04/16/2008  T:  04/17/2008  Job:  [id number]   cc:   Corwin Levins, MD

## 2011-01-03 NOTE — Consult Note (Signed)
VASCULAR SURGERY CONSULTATION   Eric [name], Eric [name]  DOB:  03/10/1951                                       08/08/2007  OZHYQ#:[id number]   INDICATIONS FOR REFERRAL:  Left lower extremity claudication.   HISTORY:  Mr. Eric [name] is a 60 year old male who describes a history  of left calf claudication occurring over the past 6 months.  This has  become more frequent and severe.  He walks approximately 20 yards now  prior to developing left calf discomfort.  He then rests with decrease  in pain.  He does intermittently get some pain in his left foot at  night.  He denies any pain at rest.  He has noted a cyanotic type  appearance to his foot over the past 6 months.  Related to this also is  some left hip pain with ambulation.   Risk factors for atherosclerotic vascular disease include hypertension  and hyperlipidemia.  He also smokes 2 packs of cigarettes daily.   PAST MEDICAL HISTORY:  1. Hypertension.  2. Hyperlipidemia.  3. COPD.  4. Sleep apnea.  5. Tobacco abuse.  6. Obesity.   SOCIAL HISTORY:  The patient is married with 3 children.  He is now on  longterm disability due to chronic back problems where he has had  multiple operations.  He continues to smoke 2 packs of cigarettes daily.  Does not consume alcohol.   FAMILY HISTORY:  Mother died at age 47 of lung cancer.  Father is  deceased as a result of suicide with a history of COPD.  He has 5  brothers whose health is generally well.  No family history of MI or  stroke.   REVIEW OF SYSTEMS:  This was reviewed per the patient encounter form  today.  He does have chronic problems with weight gain.  Notes chest  pain, tightness, shortness of breath with lying flat, palpitations and  shortness of breath with exertion.  Denies any cough or sputum  production.  Does not use home oxygen.  He has been told he has sleep  apnea but does not tolerate a CPAP mask.  He has chronic constipation  and intermittent  abdominal pain.  He notes urinary frequency.  He does  have some dizziness and headaches.  Chronic joint discomfort.  Suffers  from depression and nervousness.  No change in hearing or eyesight.  No  bleeding disorder.  No rash or ulceration.   MEDICATIONS:  Amlodipine 5 mg daily, benazepril 40 mg daily, Labetalol  300 mg b.i.d., lovastatin 40 mg daily, temazepam 30 mg daily at bedtime.   ALLERGIES:  None.   PHYSICAL EXAMINATION:  General:  An obese 60 year old male appears  approximately his stated age.  Alert and oriented, in no acute distress.  Vital signs:  BP is 136/77, pulse is 94 per minute, respirations 18 per  minute.  HEENT:  Mouth totally clear.  Normocephalic.  Extraocular  movements intact.  Neck:  Supple.  No thyromegaly or adenopathy.  Cardiovascular:  No carotid bruits.  Normal heart sounds.  No murmurs.  No rubs or gallops.  Regular rate and rhythm.  Chest:  Distant breath  sounds.  No rales or rhonchi.  Equal air entry.  Normal percussion.  Abdomen:  Obese, soft, nontender.  No masses or organomegaly.  Bowel  sounds active.  No bruits.  Lower extremities:  2+ femoral pulses  bilaterally.  Absent left popliteal, posterior tibial and dorsalis pedis  pulses.  1+ right popliteal and dorsalis pedis pulse.  1+ ankle edema  bilaterally.  Cyanosis of the left foot.  No ulceration.  Skin:  No rash  or ulceration.  Neurological:  Strength is equal bilaterally.  Normal  gait.  Reflexes intact at 1+ bilaterally.  Cranial nerves are normal.   INVESTIGATIONS:  Lower extremity Doppler evaluation reveals ankle  brachial indices 1.1 on the right leg, 0.58 on the left leg.  Waveforms  reveal a focal stenosis at the proximal right superficial femoral artery  of 50% and an occluded mid to distal left superficial femoral artery  with reconstitution of the popliteal artery.  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   IMPRESSION:  1. Left lower extremity claudication.  2. Hypertension.   3. Hyperlipidemia.  4. Chronic obstructive pulmonary disease.  5. Sleep apnea.  6. Tobacco abuse.  7. Obesity.   RECOMMENDATIONS:  The patient is severely disabled by left lower  extremity claudication.  Early symptoms of possible night pain.  I have  recommended a left lower extremity arteriogram with possible  intervention if an appropriate lesion is identified.  The patient is  agreeable to this.  Scheduled for [facility] August 27, 2006.  Will begin aspirin 325 mg daily and Plavix 75 mg daily prior to the  procedure.   Potential risks of the procedure including bleeding, limb ischemia and  emergency surgery have been reviewed.   Balinda Quails, M.D.  Electronically Signed  PGH/MEDQ  D:  08/08/2007  T:  08/09/2007  Job:  [id number]

## 2011-01-03 NOTE — Op Note (Signed)
NAMEJERIMY, Eric [name]                ACCOUNT NO.:  [id number]   MEDICAL RECORD NO.:  [id number]          PATIENT TYPE:  AMB   LOCATION:  SDS                          FACILITY:  [facility]   PHYSICIAN:  Balinda Quails, M.D.    DATE OF BIRTH:  [birth year]   DATE OF PROCEDURE:  08/28/2007  DATE OF DISCHARGE:                               OPERATIVE REPORT   PHYSICIAN:  Denman George, MD.   DIAGNOSIS:  Left lower extremity claudication.   PROCEDURE:  1. Abdominal aortogram with bilateral lower extremity runoff      arteriography.  2. Star close right common femoral artery.   ACCESS:  Right common femoral artery 5-French sheath.   COMPLICATIONS:  None apparent.   CLINICAL NOTE:  Eric [name] is a 60 year old gentleman with history  obesity, COPD and tobacco use.  Presented with claudication symptoms in  his left lower extremity.  These were severely disabling.  He is brought  to cath lab at this time for diagnostic workup with arteriography and  possible intervention.   PROCEDURE NOTE:  The patient brought to cath lab in stable condition.  Placed in supine position.  Both groins prepped and draped in sterile  fashion.   Skin and subcutaneous tissue right groin instilled 1% Xylocaine.  A  small incision made in the right groin.  Skin and subcutaneous tissues  instilled with 1% Xylocaine.  An 18 gauge needle introduced in right  common femoral artery.  0.035 Wholey guidewire advanced needle into the  mid abdominal aorta.  5-French sheath advanced over the guidewire.  A  pigtail catheter advanced over guidewire to the mid abdominal aorta.   Standard AP mid abdominal aortogram obtained.  This verified widely  patent bilateral renal arteries.  The infrarenal aorta was normal in  caliber.  There was mild to moderate plaque disease of the common iliac  arteries bilaterally without significant stenosis.  The hypogastric and  external iliac arteries were patent bilaterally.   Runoff to the  common femoral level was intact.   Bilateral lower extremity runoff arteriography obtained.  The right leg  revealed a patent profunda and superficial femoral artery.  There was a  mild to moderate stenosis at the adductor canal estimated to be 4-50%.  Popliteal artery patent.  Intact three-vessel runoff of right calf.   Left lower extremity revealed a patent proximal left superficial femoral  artery.  Left profunda femoris artery was patent.  The left superficial  femoral artery occluded at the adductor canal.  There is reconstitution  of the popliteal artery at the knee joint.  The distal popliteal artery  intact and three-vessel runoff present in the left calf.   This anatomy was not felt to be good for percutaneous intervention due  to the position of the occlusion at the left knee joint.   The guidewire reinserted, pigtail catheter removed.   Retrograde right femoral arteriogram obtained.  This revealed the sheath  to be in the right common femoral artery was without significant  disease.  Guidewire reinserted, the sheath removed.  A Star close sheath  placed over the guidewire.  The Star close device advanced through the  sheath.  The device deployed, drawn back into the arterial wall and  deployed.   There were no apparent complications.  The patient tolerated procedure  well.   FINAL IMPRESSION:  1. Single bilateral renal arteries widely patent.  2. Intact aortoiliac segment with mild to moderate disease and no      dominant stenosis.  3. Long segment left superficial femoral artery occlusion with      reconstitution of the popliteal artery at the knee joint.  4. Intact right lower extremity infrainguinal circulation.   RECOMMENDATIONS:  The patient is not a good candidate for endovascular  treatment due to the position of the occlusion and reconstitution of the  popliteal artery at the knee joint.  The patient is not a strong  candidate either for surgery due to severe  COPD and obesity.  This will  be reviewed further with the patient in the office.      Balinda Quails, M.D.  Electronically Signed     PGH/MEDQ  D:  08/28/2007  T:  08/28/2007  Job:  [id number]

## 2011-01-05 ENCOUNTER — Other Ambulatory Visit: Payer: Self-pay | Admitting: Internal Medicine

## 2011-01-06 NOTE — Discharge Summary (Signed)
[place]. [facility]  Patient:    Eric [name], Eric [name] Visit Number: [id number] MRN: [id number]          Service Type: MED Location: [phone] Attending Physician:  Phifer, Trinna Post Dictated by:   Ladell Pier, M.D. Admit Date:  07/22/2001 Discharge Date: 07/25/2001                             Discharge Summary  DISCHARGE DIAGNOSES:  1. Chest pain, rule out myocardial infarction.     a. A 2-D echocardiogram done on July 24, 2001 showed overall left        ventricular systolic function normal, left ventricular ejection        fraction 55-65% with the right ventricles mildly dilated.     b. Pulmonary embolus protocol CT scan done showed no deep venous        thrombosis, negative for pulmonary embolus.  Cardiolite rest stress        test showed large scar involving the inferior wall and inferolateral        wall.  The scar does extend into the inferior aspect of the left        ventricular apex.  No evidence of reversible ischemia.  2. Back pain since June 2002.  Plain films May 02, 2001 showed     degenerative changes in L3-4, L4-5, L5-S1.  Positive for osteophytes plus     positive for minimal disk space narrowing at L3-L4.  MRI done May 02, 2001 showed L4-L5 large central and left paracentral herniated nucleus     pulposus with some caudal extension into the left lateral recess plus mass     effect on L5 nerve root.  Multifactorial central canal and bilateral     lateral recess stenosis L4-L5.  Small disk protrusion posterolaterally on     left at L2-L3 and posterolaterally on the right at L3-L4.  3. Hypertension that is uncontrolled on hydrochlorothiazide 25 mg p.o. q.d.     started on July 12, 2001.  4. Obesity.  June 2002, 240 pounds, currently greater than 300 pounds.  5. Tobacco abuse two packs per day since age 44, recently decreased to one     pack per day.  6. Status post cervical disk fusion surgery in the past done  by Dr. Wyonia Hough.  7. Bilateral knee orthoscopic surgery.  8. Right wrist surgery, tendon/ligament repair secondary to on the job     injury.  9. Hyperglycemia during admission, questionable early type 2 diabetes.  No     history of diabetes in the past, questionable stress response syndrome. 10. Constipation. 11. Hypokalemia.  DISCHARGE MEDICATIONS: 1. Hydrochlorothiazide 25 mg p.o. q.d. 2. Vicodin 5/500 one two to tablets q.6h. p.r.n. for pain.  FOLLOWUP APPOINTMENT:  Patient to follow up with Dr. Bonnell Public on August 02, 2001 and also to follow up with Dr. Sharyn Lull, the cardiologist in three weeks post discharge.  CONSULTANTS:  Cardiology, Dr. Sharyn Lull.  PROCEDURE:  A 2-D echocardiogram and also a Cardiolite rest stress test and also a CT scan of the chest PE protocol.  CHIEF COMPLAINT:  Chest pain.  HISTORY OF PRESENT ILLNESS:  Eric [name] is a 59 year old white male with a past medical history significant for hypertension, obesity, tobacco abuse, and degenerative disk disease status post herniated disk, chronic back pain.  He presented to the emergency department with a three week history  of chest pain at rest that lasted for a few minutes up to 30 minutes.  He stated that until the day of admission his chest pain episodes would change when he moved his shoulders.  He denied any associated shortness of breath, nausea, vomiting, or diaphoresis.  No arm numbness or weakness.  No syncope.  He was seen in the outpatient clinic on September 11, 2000 and started on hydrochlorothiazide.  An EKG done in the clinic showed normal sinus rhythm.  On the day of admission he reported laying down after having decreased appetite all day and suddenly developed a 9/10 chest pain as he saturation up which radiated to the left axilla and was substernal and sharp.  He described the pain as constant and the pain did not change with movement.  He took four baby aspirin.  In the ED was started  on a nitroglycerin drip and his pain decreased to 3/10.  PAST MEDICAL HISTORY:  As per problem list.  MEDICATIONS: 1. Hydrochlorothiazide 25 mg p.o. q.d. 2. Vicodin 5/500 one to two tablets q.6h. p.r.n. back pain.  ALLERGIES:  No known drug allergies.  SOCIAL HISTORY:  He is married and has one son.  Currently unemployed, but previously worked as a Lawyer.  He applied for medicaid and that is pending.  No alcohol use.  He smokes tobacco since age 57.  Smokes two packs per day until recently.  He now smokes a pack per day.  No IV drug use.  FAMILY HISTORY:  No history of MI except for his maternal grandfather died of myocardial infarction.  REVIEW OF SYSTEMS:  Positive for left ankle swelling for two to four weeks secondary to recent injury.  Positive for weight gain of 60 pounds since June 2002.  Positive for constipation.  Positive for tingling in his feet and fingers.  Positive for polyuria, polydipsia, plus occasional dizziness. Negative for vision changes.  Otherwise normal.  PHYSICAL EXAMINATION  VITAL SIGNS:  Blood pressure 154/90, pulse 118, respirations 22, O2 saturation 95% on room air.  GENERAL:  Obese white male in no apparent distress.  HEENT:  Head is normocephalic, atraumatic.  Pupils are equal, round, and reactive to light.  Throat:  Clear.  NECK:  No JVD.  No LAD.  No carotid bruits.  HEART:  Regular rate and rhythm.  No murmurs, rubs, or gallops.  LUNGS:  Distant breath sounds, but clear to auscultation bilaterally.  No wheezes, rhonchi, or rales.  ABDOMEN:  Obese, nontender, firm.  No masses.  No guarding.  EXTREMITIES:  DP pulse 2+ bilaterally.  No clubbing, cyanosis, edema.  NEUROLOGIC:  Nonfocal.  Strength 5/5 in upper extremities.  Sensation intact. Cranial nerves 2-12 grossly intact.  HOSPITAL COURSE: #1 - CHEST PAIN:  Patient was admitted to telemetry bed to rule out MI. Serial enzymes were done which were negative.  EKG was done  which showed no ischemia.  He also received a 2-D echocardiogram which was normal except for  mildly dilated left ventricle.  Spiral CT was also done to rule out the possibility of a PE.  Spiral CT showed no evidence of DVT or pulmonary embolus.  Cardiology was consulted and a Cardiolite rest stress test was done that showed some inferolateral fixed wall motion abnormality that patient probably had some infarct in the past.  Patient was discharged with the intention to follow up with cardiology within the next three weeks. Cardiology will start patient on the necessary medications, beta blockers, ACE inhibitor, and  daily aspirin in conjunction to the other medication that he came in on.  A fasting lipid panel was also done which the results were pending on patients discharge.  Dr. Leanna Sato will follow up with patients laboratories when he presents to the clinic and start him on any antilipid agent if necessary.  #2 - PULMONARY CONGESTION:  The patient had some pulmonary congestion on his chest x-ray.  He has no history of CHF.  Likely secondary to a cardiac event leading to flash pulmonary edema.  The patient was given several small doses of Lasix during his admission course.  #3 - HYPERGLYCEMIA:  Glucose on admission was 180.  CBGs were checked during his admission and his blood sugars remained within normal limits during his hospital stay.  #4 - HYPERTENSION:  He was started on metoprolol and Lotensin during his hospitalization and continued on his hydrochlorothiazide.  #5 - BACK PAIN:  He was given Vicodin p.r.n. for his back pain.  #6 - HYPOKALEMIA:  His potassium was repleted during his hospitalization.  His hypokalemia is most likely secondary to his hydrochlorothiazide use.  #7 - TOBACCO ABUSE:  The patient was counseled on the importance of stopping his tobacco use and was going to consider using the nicotine patch and will follow up with that on discharge.  #8 -  OBESITY:  The patient was also going to work on diet with his primary care physician.  DISCHARGE LABORATORIES:  Cardiolite test shows an EF of 50% and large inferior and inferolateral scar.  Chest x-ray showed stable cardiomegaly and mild interstitial edema pattern.  The spiral CT of the chest was negative for PE and no evidence of DVT.  WBC 7.6, hemoglobin 15, hematocrit 43.7, platelets 208,000.  PTT 56, PT 12.7, INR 0.9.  Sodium 140, potassium 4, chloride 103, CO2 29, glucose 97, BUN 9, creatinine 0.9, calcium 9.2, AST 26, ALT 22, alkaline phosphatase 57, total bilirubin 0.8.  CK 126, 109, 126.  MB 2.6, 1.7, 3.1.  Relative index 2.1, 1.6, 2.5.  Troponin I less than 0.1, less than 0.1, less than 0.1.  Lipid profile: Total cholesterol 224, triglycerides 356, HDL 31, LDL 122. Dictated by:   Ladell Pier, M.D. Attending Physician:  Phifer, Trinna Post DD:  10/17/01 TD:  10/18/01 Job: [id number]

## 2011-01-06 NOTE — Op Note (Signed)
NAME:  Eric [name], Eric [name]                          ACCOUNT NO.:  [id number]   MEDICAL RECORD NO.:  [id number]                   PATIENT TYPE:  OIB   LOCATION:  5020                                 FACILITY:  [facility]   PHYSICIAN:  Reynolds Bowl, M.D.                 DATE OF BIRTH:  26-May-1951   DATE OF PROCEDURE:  DATE OF DISCHARGE:  04/09/2002                                 OPERATIVE REPORT   PREOPERATIVE DIAGNOSIS:  Recurrent herniated nucleus pulposus, L4-5, left.   POSTOPERATIVE DIAGNOSIS:  Recurrent herniated nucleus pulposus, L4-5, left.   OPERATIVE PROCEDURE:  Microscopic dissection and diskectomy at L4-5 left.   ANESTHESIA:  General.   SURGEON:  Reynolds Bowl, M.D.   ASSISTANT:  Venora Maples.   DESCRIPTION OF PROCEDURE:  The patient had an IV started and was given a  general anesthetic and a Foley catheter was put in place and he was  placed  in the operative  frame with the abdomen free, the pelvis and knee  supported. He was  then prepped in the usual  manner with Duraprep and  draped. This included use of a Vi-Drape.   I used the old incision plus about a half an inch above and half an inch  below and carefully dissected down through  the lumbar fascia and mobilized  the lumbar fascia laterally. I mobilized the paraspinal muscles laterally  with subperiosteal dissection and put a Taylor retractor in place. We then  used a cup curette and then smaller curets until  we dissected down and  identified the L4-5 interspace which was confirmed by x-ray.   Then using a small angled dissecting cup curets I freed out laterally,  proximally and distally at the bone edge, dissected underneath this and then  used the Kerrisons  to resect more bone on the end. We were at the pedicle  and basically at the sidewall, and this required considerable tedious  dissection with the aid of the  microscope. The disk interspace was a large  mass of scar with the nerve root and I was able to  carefully mobilize some  of this medially at the level of the  disk space and a little distal.   At that point I started being able to remove disk material from under the  tight nerve root. As I progressively removed more and hooked it with the  ball-tipped nerve hook, the nerve root became looser and looser. I ended up  removing a very  large amount of material, including fragments which felt  and appeared to be some of the  endplates. This was not curetted. The whole  area had degenerated and this was extruded and was basically in a pseudocyst  posteriorly, and this was gradually and totally emptied. This included a  large volume of disk material that was coming out of the  disk space. It was  all very soft and degenerated.   I made multiple passes until it felt quite empty and no more material wanted  to come out  easily. I took the ball-tipped nerve hook and passed it out the  foramen with the fourth nerve root and passed this up under the nerve root,  and progressed toward the opposite side and superiorly, and when  we were  done this passed very freely. I could determine no adhesions or other  masses. The disk space appeared and felt entirely empty.   The area was  thoroughly irrigated.  Other small bits were retrieved. In the  end the nerve root was lying free. It felt like there was minimal trauma to  the nerve root, as all the dissection was done peripheral to the  nerve  root, and after irrigation, the lumbar fascia was approximated with figure-  of-eight sutures of  #1 Vicryl, the more superficial tissues 2-0 Vicryl and  the skin edges were held in apposition  with metal staples, following which  a bulky dressing was applied.   The patient was returned to the recovery room in good condition. The blood  loss was negligible.                                               Reynolds Bowl, M.D.    JWK/MEDQ  D:  04/08/2002  T:  04/10/2002  Job:  [id number]

## 2011-01-06 NOTE — Op Note (Signed)
NAME:  Eric [name], Eric [name]                          ACCOUNT NO.:  [id number]   MEDICAL RECORD NO.:  [id number]                   PATIENT TYPE:  INP   LOCATION:  3040                                 FACILITY:  [facility]   PHYSICIAN:  Kathaleen Maser. Pool, M.D.                 DATE OF BIRTH:  17-Dec-1950   DATE OF PROCEDURE:  10/28/2002  DATE OF DISCHARGE:                                 OPERATIVE REPORT   PREOPERATIVE DIAGNOSIS:  Left L4-5 recurrent herniated nucleus pulposus with  radiculopathy.   POSTOPERATIVE DIAGNOSIS:  Left L4-5 recurrent herniated nucleus pulposus  with radiculopathy.   PROCEDURE:  Bilateral L4-5 redo microdiskectomy with L4-5 posterior lumbar  interbody fusion using Tangent wedges and local autograft, coupled with  posterolateral fusion utilizing pedicle screw fixation and local autograft.   SURGEON:  Kathaleen Maser. Pool, M.D.   ASSISTANT:  Reinaldo Meeker, M.D.   ANESTHESIA:  General endotracheal.   INDICATIONS:  The patient is a 60 year old male who is status post two  previous left-sided L4-5 laminotomies and diskectomies done by other  physicians.  After the patient's second surgery the patient failed to get  any relief and, in fact, progressively over the past six months has had  worsening of his back and lower extremity pain, failing all conservative  management.  MRI scanning demonstrates severe degenerative disk disease with  evidence of a third recurrent disk herniation off to the left side at L4-5  causing compression of the thecal sac and left-sided L5 nerve root.  We have  discussed options available for management of this condition.  He has  decided to proceed with an L4-5 decompression and fusion surgery for hopeful  improvement of his symptoms.   DESCRIPTION OF PROCEDURE:  The patient was taken to the operating room and  placed on the table in the supine position.  After an adequate level of  anesthesia was achieved, the patient was positioned prone onto the  Wilson  frame and appropriately padded.  The patient's lumbar region was prepped and  draped sterilely.  A 10 blade was used to make a linear skin incision  overlying the L3, L4, and L5 levels.  This was carried down sharply in the  midline.  Subperiosteal dissection was performed, exposing the laminae and  facet joints of L3, L4, and L5, as well as the transverse processes of L4  and L5.  A deep self-retaining retractor was placed, intraoperative  fluoroscopy is used, and the level was confirmed.  A complete laminectomy at  L4 was then undertaken using Leksell rongeurs, Kerrison rongeurs, and the  high-speed drill.  All elements of the lamina of L4 were removed.  All  elements of the anterior facets of L4 were removed bilaterally.  Superior  facetectomies were then performed at L5 bilaterally.  All bone was cleaned  and used for later autografting.  Ligamentum flavum and epidural scar  were  elevated and resected in piecemeal fashion.  Underlying thecal sac and  exiting L4 and L5 nerve roots were identified.  Wide foraminotomies were  then performed along the course of the exiting nerve roots.  Starting first  at the patient's left side, epidural venous plexus was coagulated and cut.  The thecal sac and L5 nerve root were mobilized gradually.  Epidural scar  was sequentially mobilized and cut.  The thecal sac and L5 nerve root were  mobilized and retracted toward the midline.  Recurrent disk herniation was  readily apparent.  This was then incised with a 15 blade in a rectangular  fashion.  A wide disk space clean-out was then achieved using pituitary  rongeurs, upward-angled pituitary rongeurs, and Epstein curettes.  All  elements of the recurrent disk herniation were completely resected.  All  loose or obviously degenerative disk material was removed from the  interspace.  The procedure was then repeated on the patient's right side at  L4-5, again without complication.  The disk space was  then sequentially  dilated up to 10 mm and a 10 mm distractor was left in the patient's right  side.  Attention was then placed back on the patient's left side.  The  thecal sac and nerve roots were protected.  A 10 mm Tangent reamer and then  later a 10 mm Tangent chisel was then used to prepare the interspace.  This  was done under fluoroscopic guidance.  All loose material was then removed  from the interspace.  A 10 x 26 mm Tangent wedge was then impacted into  place, recessed approximately 2 mm from the posterior cortical margin.  The  distractor was removed from the patient's right side.  Thecal sac and nerve  roots were then protected on the right side.  The disk space was once again  reamed and cut with a 10 mm Tangent chisel.  The soft tissue was then  removed from the interspace.  The interspace was then further curettaged.  Morcellized autograft was then packed in the interspace.  A second 10 x 26  mm Tangent wedge was then impacted into place and recessed approximately 2  mm from the posterior cortical margin.  The transverse processes of L4 and  L5 were then identified.  Pedicles of L4 and L5 were then isolated using  superficial landmarks and intraoperative fluoroscopy.  Superficial bone  overlying the pedicle was removed using the high-speed drill.  Each pedicle  was then probed using a pedicle awl.  Each pedicle awl track was then tapped  using a 5.25 mm screw tap.  Each screw tap hole was found to be solidly  within bone.  Spiral 90 6.75 x 55 mm screws were then placed bilaterally at  L4, 6.75 x 45 mm Spiral 9 screws were placed bilaterally at L5.  All four  screws were found to be well-positioned both through direct vision and  intraoperative fluoroscopy.  The transverse processes of L4 and L5 were then  decorticated using the high-speed drill.  Morcellized autograft was packed  posterolaterally.  A short segment of titanium rod was then placed over the screw heads.  A  locking cap was then placed over the screw heads.  The  locking caps were then engaged in sequential fashion with the construct  placed under compression.  Final images revealed good position of bone  grafts and hardware, proper operative level, normal alignment of the spine.  A blunt probe was passed out  each neural foramen easily.  There was no  evidence of any compression to the thecal sac or nerve roots.  The wound was  then irrigated one final time.  Gelfoam was placed topically for hemostasis,  found to be good.  A medium Hemovac drain was left in the epidural space.  The wound was then closed in layers using Vicryl sutures.  Steri-Strips and  sterile dressing were applied.  There were no apparent complications.  The  patient tolerated the procedure well, and he returns to the recovery room  postop.                                               Henry A. Pool, M.D.    HAP/MEDQ  D:  10/27/2002  T:  10/28/2002  Job:  [id number]

## 2011-01-06 NOTE — Op Note (Signed)
[place]  Patient:    Eric [name], Eric [name] Visit Number: [id number] MRN: [id number]          Service Type: DSU Location: *N Attending Physician:  Maryanna Shape Dictated by:   Reynolds Bowl, M.D. Proc. Date: 11/26/01                             Operative Report  PREOPERATIVE DIAGNOSIS:  Herniated nucleus pulposus L4-5 left.  POSTOPERATIVE DIAGNOSIS:  Herniated nucleus pulposus L4-5 left.  OPERATIVE PROCEDURE:  Microdiskectomy, L4-5 left.  ANESTHESIA:  General.  SURGEON:  Reynolds Bowl, M.D.  ASSISTANT:  Colon Flattery. Ollen Bowl, M.D.  DESCRIPTION:  Patient had an IV started and was given 1 g of Ancef IV, taken to the operating room, given general anesthetic, and placed on the operative frame in the usual position, all bony prominences padded.  He was then prepped and draped in the usual manner.  This included the use of DuraPrep and Ioban. Using an 18-gauge spinal needle I estimated where the 4-5 disk was as I could not palpate through his fatty layer well, and by x-ray we ended up identifying the L4-5 interspace.  Then a short incision was made over this, carried down through several inches of adipose tissue some of which was excised for visualization.  This exposed the lumbar fascia which was divided just to the edge of the posterior spines subperiosteally.  The paraspinal muscles were elevated, exposing the L4-5 interspace.  A McCullough self-retaining retractor was put in place.  The interspace was cleaned using curettes, exposing then the lower border of the L4 and the upper border of the L5 lamina.  We then incised the ligamentum flavum, placed a small patty to protect the dura, then sharply excised the ligamentum flavum. At that point it was obvious it was quite tight so I spent a little more time going a little proximal, removing some of the lower border of the L4 lamina, then out laterally removing some of the lateral overhang, then  removing some of the upper border of L5, and did some foraminotomy work.  At that point I could see the L5 nerve root which was thin and very tight with an underlying large HNP.  This nerve root was teased over enough to make an opening in the annulus that allowed me to immediately remove a large amount of dried, prominent disk material and decompress the nerve, following which I could tease it over gently a little further, then I did an essentially complete diskectomy, removing the dried degenerative disk material - some of which came out in fairly large hunks, especially the more dorsal material.  After I was satisfied this was essentially all empty, I could pass a ball-tip nerve hook under the nerve root proximally and distally, and there was no prominence, the nerve root was quite loose, and of course the foramen was quite loose.  The disk space was irrigated, the instruments were removed, the lumbar fascia approximated with #1 Vicryl, more superficial tissues with 2-0 Vicryl, and the skin edges with metal staples, following which a dressing was applied.  The patient returned to the recovery room in good condition.  ESTIMATED BLOOD LOSS:  Negligible. Dictated by:   Reynolds Bowl, M.D. Attending Physician:  Maryanna Shape DD:  11/26/01 TD:  11/26/01 Job: [id number]

## 2011-01-06 NOTE — Discharge Summary (Signed)
NAME:  Eric [name], Eric [name]                          ACCOUNT NO.:  [id number]   MEDICAL RECORD NO.:  [id number]                   PATIENT TYPE:  INP   LOCATION:  3735                                 FACILITY:  [facility]   PHYSICIAN:  Alvester Morin, M.D.               DATE OF BIRTH:  11-11-1950   DATE OF ADMISSION:  10/14/2003  DATE OF DISCHARGE:  10/15/2003                                 DISCHARGE SUMMARY   CONSULTATIONS:  None.   DISCHARGE DIAGNOSES:  1. Cerebrovascular accident, probable subacute stroke of the right temple     parietal lobe with no hemorrhage or mass effect.  2. Hypertension.  3. Tobacco abuse.  4. Coronary artery disease.   DISCHARGE MEDICATIONS:  1. Aspirin 325 mg 1 tablet p.o. daily.  2. Benazepril 40 mg 1 tablet twice a day.  3. Metoprolol 100 mg 1 tablet twice a day.  4. Lipitor 40 mg 1 tablet daily.  5. Folic acid 1 mg 1 tablet daily.  6. B complex multivitamins 1 tablet daily.   FOLLOW UP:  The patient was instructed to follow up in the acute care clinic  at 3:15 on October 26, 2003.  At that time, his echocardiogram and MRI of the  head will need to be followed up.  He was also hypokalemic, so his potassium  will need to be checked.   PROCEDURES:  1. Bilateral carotid Dopplers showed no ICA stenosis.  The vertebral artery     flow was antegrade.  There was a greater than 20 mm brachial _________     between his systolic blood pressure on the left versus the right.  The     right was 138 and left 160, but there was no occlusion of his coronary     arteries.  2. MRI of the head performed on October 15, 2003.  3. Cardiac echocardiogram performed on October 15, 2003, 2-D     echocardiogram.   HISTORY AND PHYSICAL:  Briefly, Mr. Eric [name] is a 60 year old male who has a  history significant for coronary artery disease and hypertension.  He  presented to the emergency department after the urging of his wife secondary  to developing a headache, slurred speech  that began one night prior to  admission and lasted approximately 16 hours.  When he presented to the  emergency room, his headache was intermittent.  He denied dysphagia,  diplopia, but endorsed some longstanding right-sided weakness that began  after back surgery in the past and not significantly changed.  He reported  that he does not routinely take a daily aspirin, but he does take his blood  pressure medications.  He also complained of some chest tightness for two  months exacerbated when he gets upset.  This lasts up to two hours at a  time and is associated with diaphoresis but no nausea or vomiting, no  associated shortness of breath.  ADMISSION LABORATORY DATA:  He had a PTT of 28, PT 12.3, INR 0.9.  Sodium  137, potassium 3.9, chloride 107, CO2 24, BUN 11, creatinine 1.1, glucose  94, anion gap 6. bilirubin 0.6 alkaline phosphatase 589, AST 15, ALT 15,  protein 7.1, albumin 4.3, calcium 9.8.  White blood cells 7.5, hemoglobin  16.3, hematocrit 48, platelets 199, ANC 4.4, MCV 98.6.   HOSPITAL COURSE:  #1.  CEREBROVASCULAR ACCIDENT;  When he presented, he had CT of head which  showed a probable subacute stroke .  Findings were suspicious for a subacute  nonhemorrhagic stroke interiorly in the right middle cerebral artery  distribution involving the anterior right temporal and parietal lobes.  Additional subcortical disease was present bilaterally which could have been  chronic.  Also on physical exam, he had no focal neurologic findings, but  his wife noted that his speech was slightly slurred compared to normal.  Otherwise, cranial nerves II-XII grossly intact with no pronator drift.  He  had 5/5 strength in all extremities.  Followup workup was begun with a 2-D  echocardiogram, carotid Dopplers, MRI, MRA.  We held his antihypertensives  at admission.  His admission blood pressure was 160/96.  Homocysteine check  was elevated at 16.39.  Aspirin and Plavix were begun in the  hospital.  We  also checked fasting lipids him which showed a cholesterol of 214,  triglycerides 643, HDL 21.  During his hospitalization, he had no focal  neurologic deficits.  Per his wife and himself, his slurred speech is  improved.  An evaluation of his carotid arteries reveals no stenosis.  Echocardiogram and MRI/MRA were done on day of discharge with results  currently pending.   #2.  HYPERTENSION:  Due to possibly acute CVA, his antihypertensives were  held during the acute admission.  On the day of discharge, his blood  pressure was ranging 152 to 154/80s.  Therefore, we encouraged him to  restart his home blood pressure medicines of benazepril 40 mg twice a day  and metoprolol 100 mg twice a day.  We also encouraged him, given his  history, to take an aspirin daily which he had not been doing.   #3.  HYPERLIPIDEMIA:  On looking at his lipid panel, he has markedly  elevated triglyceride level and variable HDL.  His LDL could not be  determined.  We, therefore, started him on Lipitor 40 mg 1 tablet daily with  prescription for this given to the patient on discharge.  Will be followed  as outpatient.   #4.  TOBACCO ABUSE:  The patient was extensively counseled by smoking  cessation counselors and physicians involved in his care on the risks of his  continuing tobacco abuse.  He noted that he understood those risks, but he  did not desire to quit smoking at this present time.  He did not want  nicotine patch during hospitalization either.  This will also need to be  emphasized on discharge.   #5.  INCREASED HOMOCYSTEINE LEVEL:  Secondary to this, we started him on  folic acid and B complex vitamins.   DISCHARGE LABORATORY DATA:  Sodium 137, potassium 3.4, chloride 105, CO2 24,  glucose 92, BUN 12, creatinine 1.1, calcium 9.3.  White blood cells 7.4,  hemoglobin 15.5, hematocrit 45.1, MCV 98.9, RDW 13, platelet count 174.     Tawny Asal, MD                   Steele Berg Phifer,  M.D.    CW/MEDQ  D:  10/15/2003  T:  10/16/2003  Job:  [id number]   cc:   Manning Charity, MD  [facility]  [address] [place]  [place] [address]  Fax: [phone]

## 2011-01-06 NOTE — H&P (Signed)
NAME:  Eric [name], Eric [name]                          ACCOUNT NO.:  [id number]   MEDICAL RECORD NO.:  [id number]                   PATIENT TYPE:  OIB   LOCATION:  5020                                 FACILITY:  [facility]   PHYSICIAN:  Reynolds Bowl, M.D.                 DATE OF BIRTH:  31-Oct-1950   DATE OF ADMISSION:  04/08/2002  DATE OF DISCHARGE:  04/09/2002                                HISTORY & PHYSICAL   HISTORY AND PHYSICAL:  The patient is a 60 year old man who underwent L4-5  diskectomy on the left on November 26, 2001, with the findings of a very large  HNP up under a very tight nerve root.   By December 17, 2001, he was reporting 75% resolution of his symptoms.   He fell on March 29, 2002.  Since then he has had recurrent pain.  He is  complaining of pain in the left hip and left ankle, as well as an inability  to straighten up.  He has minimal back discomfort.  We have obtained an MRI  which showed a very large recurrence at L4-5 on the left.  We have discussed  surgery and the fact that there are no guarantees.  He wanted to proceed on  with same.   ALLERGIES:  He has no known allergies.   MEDICATIONS:  He is an antihypertensive, but could not name it.   PAST SURGICAL HISTORY:  See old charts.  There has been no change.   HABITS:  He was a three-pack a day smoker.  He is down to one to one-and-a-  half packs of cigarettes per day.   REVIEW OF SYMPTOMS:  Again, he has minimal low back pain.  Most all of the  pain is in the left hip.   PHYSICAL EXAMINATION:  HEIGHT:  6 feet 1 inch.  WEIGHT:  280 pounds.  VITAL SIGNS:  Temperature 98 degrees, pulse 64, respirations 20.  HEENT:  His extraocular movements are full.  Tympanic membranes are normal.  His pharynx is clear, except for smoking erythema.  NECK:  There is no palpable adenopathy and no carotid bruits.  CHEST:  Breath sounds are clear.  He has fair volume.  HEART:  Regular rhythm.  No murmurs.  ABDOMEN:  Obese.  There are  no masses or tenderness.  Bowel sounds are okay.  He stands with a list to the right and a cane in the right hand.  ORTHOPEDIC:  He has adequate distal perfusion.  He is tender over the  surgical scar.  The surgical scar appears to be well healed.  There is no  erythema or swelling.  Straight leg raising on the right is negative.  On  the left at 90 degrees, he complains of buttock pain.  He can single stand  to toe rise on either side with good strength.  Standing dorsiflexion  strength on the  left can be overcome in a plastic manner.  The ankle jerk on  the right is 1+ and that on the left is 0.  MRI shows a very large  recurrence at L4-5, left.   ADMISSION DIAGNOSES:  1. Recurrent herniated nucleus pulposis, L4-5, left.  2. Hypertension.  3. Chronic smoker.   PLAN:  L4-5 diskectomy if the blood pressure is reduced by tomorrow.  In the  meantime, he will take his medications.  He will stop taking Bextra.                                               Reynolds Bowl, M.D.    JWK/MEDQ  D:  04/07/2002  T:  04/09/2002  Job:  [id number]

## 2011-01-06 NOTE — Procedures (Signed)
Eric [name], BAGNELL NO.:  [id number]   MEDICAL RECORD NO.:  [id number]          PATIENT TYPE:  OUT   LOCATION:  SLEEP CENTER                 FACILITY:  [facility]   PHYSICIAN:  Clinton D. Maple Hudson, M.D. DATE OF BIRTH:  [birth year]   DATE OF STUDY:  07/22/2005                              NOCTURNAL POLYSOMNOGRAM   REFERRING PHYSICIAN:  Dr. Jetty Duhamel.   DATE OF STUDY:  July 22, 2005.   INDICATION FOR STUDY:  Hypersomnia with sleep apnea.   EPWORTH SLEEPINESS SCORE:  3/24.   BMI:  43.   WEIGHT:  285 pounds.   The patient indicated that he could only sleep on his side and that he would  need opportunity to go smoke.   SLEEP ARCHITECTURE:  Total sleep time 347 minutes with sleep efficiency 77%.  Stage I was 8%, stage II 41%, stages III and IV 29%, REM 22% of total sleep  time. Sleep latency 14 minutes, REM latency 92 minutes, awake after sleep  onset 91 minutes, arousal index 11. Temazepam and extra strength equate pain  reliever were taken at bedtime.   RESPIRATORY DATA:  Apnea/hypopnea index (AHI, RDI) 16.2 obstructive events  per hour indicating mild to moderate obstructive sleep apnea/hypopnea  syndrome. This included 1 central apnea, 34 obstructive apneas and 59  hypopneas. Events were reported equally on left and right sides with most  sleep on left side. REM AHI 57.3. There were insufficient early events to  permit use of C-PAP titration by protocol.   OXYGEN DATA:  Moderately loud snoring with oxygen desaturation to a nadir of  74%. Mean oxygen saturation through the study was 89% on room air indicating  probable cardiopulmonary disease.   CARDIAC DATA:  No sinus rhythm.   MOVEMENT/PARASOMNIA:  Occasional leg jerk with little effect on sleep.   IMPRESSION/RECOMMENDATIONS:  1.  Note, the patient specific expectation that he could only sleep on      sides, especially left side, and that he must be able to smoke at night.  2.  Mild to moderate  obstructive sleep apnea/hypopnea syndrome, AHI 16.2 per      hour with moderately loud snoring and oxygen desaturation to 74%.  3.  Note mean oxygen saturation through the night was 89% on room air      indicating probable cardiopulmonary      disease.  4.  Consider return for C-PAP titration or evaluate for alternative      therapies as appropriate.      Clinton D. Maple Hudson, M.D.  Diplomate, Biomedical engineer of Sleep Medicine  Electronically Signed     CDY/MEDQ  D:  07/30/2005 10:15:15  T:  07/30/2005 96:29:52  Job:  [id number]

## 2011-01-06 NOTE — H&P (Signed)
[place]  Patient:    RED, MANDT Visit Number: [id number] MRN: [id number]          Service Type: EMS Location: [place] Attending Physician:  Armanda Heritage Dictated by:   Reynolds Bowl, M.D. Admit Date:  11/02/2001 Discharge Date: 11/02/2001                           History and Physical  INTRODUCTION:  The patient is a 60 year old man with a complaint of low back pain down to the left lower extremity associated with weakness and intermittent falling.  HISTORY OF PRESENT ILLNESS:  Patient was followed in this office initially by Dr. Humberto Leep. [place] with a history of progressive increasing pain and weakness over a six-month period and I had seen the patient in November with weakness and a large HNP at L4-5 on the left.  He subsequently tried epidural steroids, which did not work, and because of financial problems, he sought care at [facility] and that has not worked out, so he has returned to the office today with continuing pain which he calls severe, all the way down the left lower extremity.  We discussed surgery, we looked at his MRI, we discussed his weight, his hypertension and the fact that he is still smoking and the fact that all of these do not bode well for him and he would like to proceed on with surgery; this was discussed with the patient as well as his wife.  REVIEW OF SYSTEMS:  He does continue to smoke.  He states he did not take his blood pressure medicines today but blood pressure was okay yesterday.  He does not drink ethanol.  PAST SURGICAL HISTORY:  Prior surgery included cervical fusions and knee scopes bilaterally.  ALLERGIES:  None.  PHYSICAL EXAMINATION:  VITAL SIGNS:  He is 6 feet 1 inch, 297 pounds.  Temperature is 98.0, blood pressure 164/110, pulse is 102, respirations 16.  HEENT:  Extraocular movements full.  Tympanic membranes have satisfactory light reflex.  Pharynx is clear.  There are  several missing teeth.  No dentures.  NECK:  No carotid bruit.  No cervical mass.  CHEST:  Chest volume is decreased but breath sounds clear.  HEART:  Regular rhythm and no murmurs heard.  ABDOMEN:  Pendulous, nontender.  No palpable masses.  Bowel sounds are okay.  ORTHOPEDIC:  Evaluation discloses patient ambulates leaning far to the right against a cane.  He is tender midline in the left low back.  Straight leg raising is positive on the left for left hip pain, negative on the right. Deep tendon reflexes are a trace.  Dorsalis pedis pulses good.  He has decreased motor strength, dorsiflexion of the ankle as well as the EHL on the left.  As I brush across his lateral calf, he states that feels different and somewhat tingling to his toes.  LABORATORY AND ACCESSORY DATA:  MRI dated September of 2002 shows a large HNP at L4-5, left, with some thickening of facets and ligamentum flavum.  ADMITTING DIAGNOSES: 1. Herniated nucleus pulposus, L4-5, left. 2. Hypertension. 3. Obesity. 4. Cigarette smoker.  PLAN:  Diskectomy at L4-5, left.  I have written prescription for Vicodin and Tylox.  We discussed his postoperative course.  He knows there are no guarantees. Dictated by:   Reynolds Bowl, M.D. Attending Physician:  Armanda Heritage DD:  11/11/01 TD:  11/12/01 Job: [id number]  [id number]

## 2011-01-13 ENCOUNTER — Other Ambulatory Visit: Payer: Self-pay | Admitting: Internal Medicine

## 2011-03-08 ENCOUNTER — Other Ambulatory Visit: Payer: Self-pay | Admitting: Internal Medicine

## 2011-03-10 ENCOUNTER — Other Ambulatory Visit: Payer: Self-pay | Admitting: Internal Medicine

## 2011-03-24 ENCOUNTER — Other Ambulatory Visit: Payer: Self-pay | Admitting: Internal Medicine

## 2011-04-10 ENCOUNTER — Other Ambulatory Visit: Payer: Self-pay | Admitting: Internal Medicine

## 2011-05-01 ENCOUNTER — Other Ambulatory Visit: Payer: Self-pay | Admitting: Internal Medicine

## 2011-05-05 ENCOUNTER — Other Ambulatory Visit: Payer: Self-pay | Admitting: *Deleted

## 2011-05-05 MED ORDER — TEMAZEPAM 30 MG PO CAPS: 30.0000 mg | ORAL_CAPSULE | Freq: Every evening | ORAL | Status: DC | PRN

## 2011-05-05 NOTE — Telephone Encounter (Signed)
Called the patient and he is wanting a refill on Temazepam 30 mg sent request to MD

## 2011-05-05 NOTE — Telephone Encounter (Signed)
Pt left vm pharmacy states md denied medication refill. Requesting to speak with nurse want to know why?

## 2011-05-08 NOTE — Telephone Encounter (Signed)
Faxed hardcopy to pharmacy. 

## 2011-05-10 LAB — COMPREHENSIVE METABOLIC PANEL
ALT: 34
AST: 25
Albumin: 4
Alkaline Phosphatase: 54
GFR calc Af Amer: >60
Glucose, Bld: 140 — ABNORMAL HIGH
Potassium: 4.4
Sodium: 140
Total Protein: 6.8

## 2011-05-10 LAB — CBC
Hemoglobin: 15.3
Platelets: 190
RDW: 13.4

## 2011-05-11 ENCOUNTER — Other Ambulatory Visit: Payer: Self-pay | Admitting: Internal Medicine

## 2011-05-16 DIAGNOSIS — J449 Chronic obstructive pulmonary disease, unspecified: Secondary | ICD-10-CM

## 2011-05-23 ENCOUNTER — Other Ambulatory Visit: Payer: Self-pay | Admitting: Internal Medicine

## 2011-05-28 ENCOUNTER — Encounter: Payer: Self-pay | Admitting: Internal Medicine

## 2011-05-28 DIAGNOSIS — Z0000 Encounter for general adult medical examination without abnormal findings: Secondary | ICD-10-CM | POA: Insufficient documentation

## 2011-06-02 ENCOUNTER — Encounter: Payer: Self-pay | Admitting: Internal Medicine

## 2011-06-02 ENCOUNTER — Ambulatory Visit (INDEPENDENT_AMBULATORY_CARE_PROVIDER_SITE_OTHER): Payer: No Typology Code available for payment source | Admitting: Internal Medicine

## 2011-06-02 ENCOUNTER — Other Ambulatory Visit (INDEPENDENT_AMBULATORY_CARE_PROVIDER_SITE_OTHER): Payer: No Typology Code available for payment source

## 2011-06-02 ENCOUNTER — Other Ambulatory Visit: Payer: Self-pay | Admitting: Internal Medicine

## 2011-06-02 VITALS — BP 120/62 | HR 86 | Temp 98.0°F | Ht 72.0 in | Wt 307.0 lb

## 2011-06-02 DIAGNOSIS — M519 Unspecified thoracic, thoracolumbar and lumbosacral intervertebral disc disorder: Secondary | ICD-10-CM | POA: Insufficient documentation

## 2011-06-02 DIAGNOSIS — I739 Peripheral vascular disease, unspecified: Secondary | ICD-10-CM | POA: Insufficient documentation

## 2011-06-02 DIAGNOSIS — Z0000 Encounter for general adult medical examination without abnormal findings: Secondary | ICD-10-CM

## 2011-06-02 DIAGNOSIS — Z23 Encounter for immunization: Secondary | ICD-10-CM

## 2011-06-02 DIAGNOSIS — E119 Type 2 diabetes mellitus without complications: Secondary | ICD-10-CM

## 2011-06-02 DIAGNOSIS — Z125 Encounter for screening for malignant neoplasm of prostate: Secondary | ICD-10-CM

## 2011-06-02 HISTORY — DX: Peripheral vascular disease, unspecified: I73.9

## 2011-06-02 LAB — URINALYSIS, ROUTINE W REFLEX MICROSCOPIC
Bilirubin Urine: NEGATIVE
Ketones, ur: NEGATIVE
Leukocytes, UA: NEGATIVE
Specific Gravity, Urine: 1.025 (ref 1.000–1.030)
Urine Glucose: NEGATIVE
Urobilinogen, UA: 0.2 (ref 0.0–1.0)

## 2011-06-02 LAB — PSA: PSA: 0.21 ng/mL (ref 0.10–4.00)

## 2011-06-02 LAB — HEPATIC FUNCTION PANEL
AST: 26 U/L (ref 0–37)
Albumin: 4.5 g/dL (ref 3.5–5.2)
Alkaline Phosphatase: 55 U/L (ref 39–117)
Bilirubin, Direct: 0.1 mg/dL (ref 0.0–0.3)
Total Bilirubin: 0.3 mg/dL (ref 0.3–1.2)

## 2011-06-02 LAB — CBC WITH DIFFERENTIAL/PLATELET
Basophils Absolute: 0 10*3/uL (ref 0.0–0.1)
Eosinophils Absolute: 0.2 10*3/uL (ref 0.0–0.7)
HCT: 46.5 % (ref 39.0–52.0)
Hemoglobin: 15.8 g/dL (ref 13.0–17.0)
Lymphs Abs: 2.2 10*3/uL (ref 0.7–4.0)
MCHC: 33.9 g/dL (ref 30.0–36.0)
MCV: 102.1 fl — ABNORMAL HIGH (ref 78.0–100.0)
Monocytes Absolute: 0.4 10*3/uL (ref 0.1–1.0)
Monocytes Relative: 4.9 % (ref 3.0–12.0)
Neutro Abs: 4.7 10*3/uL (ref 1.4–7.7)
Platelets: 122 10*3/uL — ABNORMAL LOW (ref 150.0–400.0)
RDW: 13.7 % (ref 11.5–14.6)

## 2011-06-02 LAB — BASIC METABOLIC PANEL
Calcium: 9.5 mg/dL (ref 8.4–10.5)
GFR: 85.59 mL/min (ref >60.00)
Glucose, Bld: 210 mg/dL — ABNORMAL HIGH (ref 70–99)
Potassium: 3.9 mEq/L (ref 3.5–5.1)
Sodium: 143 mEq/L (ref 135–145)

## 2011-06-02 LAB — LIPID PANEL
HDL: 33.6 mg/dL — ABNORMAL LOW (ref >39.00)
Total CHOL/HDL Ratio: 6
Triglycerides: 569 mg/dL — ABNORMAL HIGH (ref 0.0–149.0)

## 2011-06-02 LAB — TSH: TSH: 2.21 u[IU]/mL (ref 0.35–5.50)

## 2011-06-02 LAB — MICROALBUMIN / CREATININE URINE RATIO: Microalb Creat Ratio: 10.2 mg/g (ref 0.0–30.0)

## 2011-06-02 MED ORDER — LABETALOL HCL 300 MG PO TABS: 300.0000 mg | ORAL_TABLET | Freq: Two times a day (BID) | ORAL | Status: DC

## 2011-06-02 MED ORDER — BENAZEPRIL HCL 40 MG PO TABS: 40.0000 mg | ORAL_TABLET | Freq: Every day | ORAL | Status: DC

## 2011-06-02 MED ORDER — ZOLPIDEM TARTRATE ER 12.5 MG PO TBCR: 12.5000 mg | EXTENDED_RELEASE_TABLET | Freq: Every evening | ORAL | Status: DC | PRN

## 2011-06-02 MED ORDER — TEMAZEPAM 30 MG PO CAPS: 30.0000 mg | ORAL_CAPSULE | Freq: Every evening | ORAL | Status: AC | PRN

## 2011-06-02 MED ORDER — AMLODIPINE BESYLATE 10 MG PO TABS: 10.0000 mg | ORAL_TABLET | Freq: Every day | ORAL | Status: DC

## 2011-06-02 MED ORDER — METFORMIN HCL 500 MG PO TABS: ORAL_TABLET | ORAL | Status: DC

## 2011-06-02 MED ORDER — GLIMEPIRIDE 4 MG PO TABS: 4.0000 mg | ORAL_TABLET | Freq: Two times a day (BID) | ORAL | Status: DC

## 2011-06-02 MED ORDER — SIMVASTATIN 80 MG PO TABS: 80.0000 mg | ORAL_TABLET | Freq: Every day | ORAL | Status: DC

## 2011-06-02 NOTE — Patient Instructions (Addendum)
You had the flu shot today Please go to LAB in the Basement for the blood and/or urine tests to be done today Please call the phone number [phone] (the [employer] System) for results of testing in 2-3 days;  When calling, simply dial the number, and when prompted enter the MRN number above (the Medical Record Number) and the # key, then the message should start. Continue all other medications as before; all refills are sent to [facility] except for the ambien cr Please return in 6 mo with Lab testing done 3-5 days before Please call if you change your mind about the colonoscopy

## 2011-06-02 NOTE — Assessment & Plan Note (Signed)
D/w pt - pt declines as he has fear of even conscious sedation with his COPD and OSA

## 2011-06-02 NOTE — Progress Notes (Signed)
Subjective:    Patient ID: Eric [name], male    DOB: Aug 28, 1950, 60 y.o.   MRN: [id number]  HPI  Here for wellness and f/u;  Overall doing ok;  Pt denies CP, worsening SOB, DOE, wheezing, orthopnea, PND, worsening LE edema, palpitations, dizziness or syncope.  Pt denies neurological change such as new Headache, facial or extremity weakness.  Pt denies polydipsia, polyuria, or low sugar symptoms. Pt states overall good compliance with treatment and medications, good tolerability, and trying to follow lower cholesterol diet.  Pt denies worsening depressive symptoms, suicidal ideation or panic. No fever, wt loss, night sweats, loss of appetite, or other constitutional symptoms.  Pt states good ability with ADL's, low fall risk, home safety reviewed and adequate, no significant changes in hearing or vision, and occasionally active with exercise.  Has ongoing OSA, untreated due to unable to tolerate the mask.  Meds are still too expnsive , does not feel he needs the oxycodone for pain.  Pt continues to have recurring LBP without change in severity, bowel or bladder change, fever, wt loss,  worsening LE pain/numbness/weakness, gait change or falls. . Past Medical History  Diagnosis Date  . ALLERGIC RHINITIS 06/24/2007  . 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 ANXIETY 06/24/2007  . CELLULITIS AND ABSCESS OF LEG EXCEPT FOOT 03/28/2010  . CEREBROVASCULAR ACCIDENT, HX OF 06/24/2007  . COLONIC POLYPS, HX OF 06/24/2007  . COMMON MIGRAINE 06/24/2007  . CONSTIPATION, CHRONIC, HX OF 06/24/2007  . DEPRESSION 06/24/2007  . DIABETES MELLITUS, TYPE II 07/21/2008  . DIVERTICULOSIS, COLON 06/24/2007  . HYPERLIPIDEMIA 06/24/2007  . HYPERTENSION 06/24/2007  . INSOMNIA-SLEEP DISORDER-UNSPEC 01/24/2010  . OBSTRUCTIVE SLEEP APNEA 06/24/2007  . OTHER POSTSURGICAL STATUS OTHER 06/24/2007  . SKIN LESION 01/24/2010  . Unspecified Peripheral Vascular Disease 06/24/2007  . Claudication 06/02/2011   Past Surgical History  Procedure Date  . C spine  fusion after job related injury 1985  . Bilat knee surgury 1990  . Hand surgury 1999  . Lumbar laminectomy     reports that he has been smoking.  He does not have any smokeless tobacco history on file. He reports that he does not drink alcohol or use illicit drugs. family history includes COPD in his father; Cancer in his mother; and Heart disease in his maternal grandfather and maternal grandmother. No Known Allergies Current Outpatient Prescriptions on File Prior to Visit  Medication Sig Dispense Refill  . amLODipine (NORVASC) 10 MG tablet TAKE ONE TABLET BY MOUTH EVERY DAY  30 tablet  1  . aspirin 81 MG tablet Take 81 mg by mouth daily.        . benazepril (LOTENSIN) 40 MG tablet TAKE ONE TABLET BY MOUTH EVERY DAY  30 tablet  0  . glimepiride (AMARYL) 4 MG tablet TAKE TWO TABLETS BY MOUTH EVERY DAY  60 tablet  1  . labetalol (NORMODYNE) 300 MG tablet Take 300 mg by mouth 2 (two) times daily.        . metFORMIN (GLUCOPHAGE) 500 MG tablet TAKE FOUR TABLETS BY MOUTH EVERY DAY  120 tablet  1  . oxycodone (OXY-IR) 5 MG capsule Take 5 mg by mouth every 6 (six) hours as needed.        . simvastatin (ZOCOR) 80 MG tablet TAKE ONE TABLET BY MOUTH EVERY DAY  30 tablet  0  . temazepam (RESTORIL) 30 MG capsule Take 1 capsule (30 mg total) by mouth at bedtime as needed for sleep.  30 capsule  3  .  zolpidem (AMBIEN CR) 12.5 MG CR tablet Take 12.5 mg by mouth at bedtime as needed.         Review of Systems Review of Systems  Constitutional: Negative for diaphoresis and unexpected weight change.  HENT: Negative for drooling and tinnitus.   Eyes: Negative for photophobia and visual disturbance.  Respiratory: Negative for choking and stridor.   Gastrointestinal: Negative for vomiting and blood in stool.  Genitourinary: Negative for hematuria and decreased urine volume.    Objective:   Physical Exam BP 120/62  Pulse 86  Temp(Src) 98 F (36.7 C) (Oral)  Ht 6' (1.829 m)  Wt 307 lb (139.254 kg)  BMI  41.64 kg/m2  SpO2 92% Physical Exam  VS noted, morbid obese Constitutional: Pt is oriented to person, place, and time. Appears well-developed and well-nourished.  Head: Normocephalic and atraumatic.  Right Ear: External ear normal.  Left Ear: External ear normal.  Nose: Nose normal.  Mouth/Throat: Oropharynx is clear and moist.  Eyes: Conjunctivae and EOM are normal. Pupils are equal, round, and reactive to light.  Neck: Normal range of motion. Neck supple. No JVD present. No tracheal deviation present.  Cardiovascular: Normal rate, regular rhythm, normal heart sounds and intact distal pulses.   Pulmonary/Chest: Effort normal and breath sounds normal.  Abdominal: Soft. Bowel sounds are normal. There is no tenderness.  Musculoskeletal: Normal range of motion. Exhibits trace edema left> right.  Lymphadenopathy:  Has no cervical adenopathy.  Neurological: Pt is alert and oriented to person, place, and time. Pt has normal reflexes. No cranial nerve deficit.  Skin: Skin is warm and dry. No rash noted.  Psychiatric:  Has  normal mood and affect. Behavior is normal. 1+ nervous    Assessment & Plan:

## 2011-06-03 ENCOUNTER — Other Ambulatory Visit: Payer: Self-pay | Admitting: Internal Medicine

## 2011-06-03 MED ORDER — FENOFIBRATE 160 MG PO TABS: 160.0000 mg | ORAL_TABLET | Freq: Every day | ORAL | Status: DC

## 2011-06-04 ENCOUNTER — Encounter: Payer: Self-pay | Admitting: Internal Medicine

## 2011-06-04 NOTE — Assessment & Plan Note (Signed)
stable overall by hx and exam, most recent data reviewed with pt, and pt to continue medical treatment as before  Lab Results  Component Value Date   HGBA1C 6.7* 06/02/2011

## 2011-06-04 NOTE — Assessment & Plan Note (Addendum)
Overall doing well, age appropriate education and counseling updated, referrals for preventative services and immunizations addressed, dietary and smoking counseling addressed, most recent labs and ECG reviewed.  I have personally reviewed and have noted: 1) the patient's medical and social history 2) The pt's use of alcohol, tobacco, and illicit drugs 3) The patient's current medications and supplements 4) Functional ability including ADL's, fall risk, home safety risk, hearing and visual impairment 5) Diet and physical activities 6) Evidence for depression or mood disorder 7) The patient's height, weight, and BMI have been recorded in the chart I have made referrals, and provided counseling and education based on review of the above For flu shot today, al meds refilled, declines colonoscopy

## 2011-06-21 ENCOUNTER — Ambulatory Visit (INDEPENDENT_AMBULATORY_CARE_PROVIDER_SITE_OTHER): Payer: No Typology Code available for payment source | Admitting: Internal Medicine

## 2011-06-21 ENCOUNTER — Encounter: Payer: Self-pay | Admitting: Internal Medicine

## 2011-06-21 VITALS — BP 132/80 | HR 90 | Temp 99.0°F | Ht 72.0 in | Wt 304.5 lb

## 2011-06-21 DIAGNOSIS — E119 Type 2 diabetes mellitus without complications: Secondary | ICD-10-CM

## 2011-06-21 DIAGNOSIS — E785 Hyperlipidemia, unspecified: Secondary | ICD-10-CM

## 2011-06-21 DIAGNOSIS — I10 Essential (primary) hypertension: Secondary | ICD-10-CM

## 2011-06-21 DIAGNOSIS — G4700 Insomnia, unspecified: Secondary | ICD-10-CM

## 2011-06-21 MED ORDER — ATORVASTATIN CALCIUM 80 MG PO TABS: 80.0000 mg | ORAL_TABLET | Freq: Every day | ORAL | Status: DC

## 2011-06-21 MED ORDER — BENAZEPRIL HCL 40 MG PO TABS: 40.0000 mg | ORAL_TABLET | Freq: Every day | ORAL | Status: DC

## 2011-06-21 MED ORDER — LABETALOL HCL 300 MG PO TABS: 300.0000 mg | ORAL_TABLET | Freq: Two times a day (BID) | ORAL | Status: DC

## 2011-06-21 MED ORDER — GLIMEPIRIDE 4 MG PO TABS: 4.0000 mg | ORAL_TABLET | Freq: Two times a day (BID) | ORAL | Status: DC

## 2011-06-21 MED ORDER — METFORMIN HCL 500 MG PO TABS: ORAL_TABLET | ORAL | Status: DC

## 2011-06-21 MED ORDER — FENOFIBRATE 160 MG PO TABS: 160.0000 mg | ORAL_TABLET | Freq: Every day | ORAL | Status: DC

## 2011-06-21 MED ORDER — AMLODIPINE BESYLATE 10 MG PO TABS: 10.0000 mg | ORAL_TABLET | Freq: Every day | ORAL | Status: DC

## 2011-06-21 NOTE — Patient Instructions (Addendum)
Stop the zocor when you are out Start the generic for lipitor after that Continue all other medications as before Remember, your medication cost will likely be lower at [facility]

## 2011-06-21 NOTE — Progress Notes (Signed)
Subjective:    Patient ID: Eric [name], male    DOB: [birth year], 60 y.o.   MRN: [id number]  HPI  Here to f/u;  Here to f/u; overall doing ok,  Pt denies chest pain, increased sob or doe, wheezing, orthopnea, PND, increased LE swelling, palpitations, dizziness or syncope.  Pt denies new neurological symptoms such as new headache, or facial or extremity weakness or numbness   Pt denies polydipsia, polyuria, or low sugar symptoms such as weakness or confusion improved with po intake.  Pt states overall good compliance with meds, trying to follow lower cholesterol, diabetic diet, wt overall stable but little exercise however.  Was thinking of applying for scooter but states he is able to ambulate about the home without diffictuly, only tripped and fell over a door jam recently.  Is on zocor adn will need change in light of taking the amlodipine.  Meds expensive as current pharmacy.  Sleeping better on current med. Past Medical History  Diagnosis Date  . ALLERGIC RHINITIS 06/24/2007  . 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 ANXIETY 06/24/2007  . CELLULITIS AND ABSCESS OF LEG EXCEPT FOOT 03/28/2010  . CEREBROVASCULAR ACCIDENT, HX OF 06/24/2007  . COLONIC POLYPS, HX OF 06/24/2007  . COMMON MIGRAINE 06/24/2007  . CONSTIPATION, CHRONIC, HX OF 06/24/2007  . DEPRESSION 06/24/2007  . DIABETES MELLITUS, TYPE II 07/21/2008  . DIVERTICULOSIS, COLON 06/24/2007  . HYPERLIPIDEMIA 06/24/2007  . HYPERTENSION 06/24/2007  . INSOMNIA-SLEEP DISORDER-UNSPEC 01/24/2010  . OBSTRUCTIVE SLEEP APNEA 06/24/2007  . OTHER POSTSURGICAL STATUS OTHER 06/24/2007  . SKIN LESION 01/24/2010  . Unspecified Peripheral Vascular Disease 06/24/2007  . Claudication 06/02/2011   Past Surgical History  Procedure Date  . C spine fusion after job related injury 1985  . Bilat knee surgury 1990  . Hand surgury 1999  . Lumbar laminectomy     reports that he has been smoking.  He does not have any smokeless tobacco history on file. He reports that he does not drink  alcohol or use illicit drugs. family history includes COPD in his father; Cancer in his mother; and Heart disease in his maternal grandfather and maternal grandmother. Allergies  Allergen Reactions  . Oxycodone Other (See Comments)    Sedation/somnolence   Current Outpatient Prescriptions on File Prior to Visit  Medication Sig Dispense Refill  . aspirin 81 MG tablet Take 81 mg by mouth daily.        . temazepam (RESTORIL) 30 MG capsule Take 1 capsule (30 mg total) by mouth at bedtime as needed for sleep.  30 capsule  5   Review of Systems Review of Systems  Constitutional: Negative for diaphoresis and unexpected weight change.  HENT: Negative for drooling and tinnitus.   Eyes: Negative for photophobia and visual disturbance.  Respiratory: Negative for choking and stridor.   Gastrointestinal: Negative for vomiting and blood in stool.  Genitourinary: Negative for hematuria and decreased urine volume.    Objective:   Physical Exam BP 132/80  Pulse 90  Temp(Src) 99 F (37.2 C) (Oral)  Ht 6' (1.829 m)  Wt 304 lb 8 oz (138.12 kg)  BMI 41.30 kg/m2  SpO2 92% Physical Exam  VS noted Constitutional: Pt appears well-developed and well-nourished.  HENT: Head: Normocephalic.  Right Ear: External ear normal.  Left Ear: External ear normal.  Eyes: Conjunctivae and EOM are normal. Pupils are equal, round, and reactive to light.  Neck: Normal range of motion. Neck supple.  Cardiovascular: Normal rate and regular rhythm.   Pulmonary/Chest: Effort  normal and breath sounds normal.  Abd:  Soft, NT, non-distended, + BS Neurological: Pt is alert. No cranial nerve deficit.  Skin: Skin is warm. No erythema.  Psychiatric: Pt behavior is normal. Thought content normal.     Assessment & Plan:

## 2011-06-22 ENCOUNTER — Encounter: Payer: Self-pay | Admitting: Internal Medicine

## 2011-06-22 NOTE — Assessment & Plan Note (Signed)
stable overall by hx and exam, most recent data reviewed with pt, and pt to continue medical treatment as before  BP Readings from Last 3 Encounters:  06/21/11 132/80  06/02/11 120/62  03/28/10 130/72

## 2011-06-22 NOTE — Assessment & Plan Note (Signed)
stable overall by hx and exam, most recent data reviewed with pt, and pt to continue medical treatment as before  Lab Results  Component Value Date   HGBA1C 6.7* 06/02/2011    

## 2011-06-22 NOTE — Assessment & Plan Note (Signed)
stable overall by hx and exam, most recent data reviewed with pt, and pt to continue medical treatment as before except change zocor to lipitor as he is on amlodipine and needs better control

## 2011-06-22 NOTE — Assessment & Plan Note (Signed)
Improved, Continue all other medications as before  

## 2011-09-08 ENCOUNTER — Ambulatory Visit: Payer: No Typology Code available for payment source | Admitting: Internal Medicine

## 2011-09-18 ENCOUNTER — Ambulatory Visit (INDEPENDENT_AMBULATORY_CARE_PROVIDER_SITE_OTHER): Payer: Medicare Other | Admitting: Internal Medicine

## 2011-09-18 ENCOUNTER — Encounter: Payer: Self-pay | Admitting: Internal Medicine

## 2011-09-18 VITALS — BP 120/60 | HR 89 | Temp 98.6°F | Ht 71.0 in | Wt 309.5 lb

## 2011-09-18 DIAGNOSIS — G4700 Insomnia, unspecified: Secondary | ICD-10-CM

## 2011-09-18 DIAGNOSIS — E119 Type 2 diabetes mellitus without complications: Secondary | ICD-10-CM

## 2011-09-18 DIAGNOSIS — I10 Essential (primary) hypertension: Secondary | ICD-10-CM

## 2011-09-18 DIAGNOSIS — E785 Hyperlipidemia, unspecified: Secondary | ICD-10-CM

## 2011-09-18 MED ORDER — CLONAZEPAM 0.5 MG PO TABS: ORAL_TABLET | ORAL | Status: DC

## 2011-09-18 NOTE — Assessment & Plan Note (Signed)
stable overall by hx and exam, most recent data reviewed with pt, and pt to continue medical treatment as before  Last ldl 50, oct 2012

## 2011-09-18 NOTE — Assessment & Plan Note (Signed)
stable overall by hx and exam, most recent data reviewed with pt, and pt to continue medical treatment as before  Lab Results  Component Value Date   HGBA1C 6.7* 06/02/2011    

## 2011-09-18 NOTE — Assessment & Plan Note (Signed)
Ok to change ambien to klonopin asd prn,  to f/u any worsening symptoms or concerns

## 2011-09-18 NOTE — Progress Notes (Signed)
Subjective:    Patient ID: Eric [name], male    DOB: 09/09/1950, 61 y.o.   MRN: [id number]  HPI  Here to f/u here earlier than expected as pt states new insurance requires "initial visit".; overall doing ok,  Pt denies chest pain, increased sob or doe, wheezing, orthopnea, PND, increased LE swelling, palpitations, dizziness or syncope.  Pt denies new neurological symptoms such as new headache, or facial or extremity weakness or numbness   Pt denies polydipsia, polyuria, or low sugar symptoms such as weakness or confusion improved with po intake.  Pt states overall good compliance with meds, trying to follow lower cholesterol, diabetic diet, wt overall stable but little exercise however, but [facility]  Temazepam not covered by insurance - needs change.  No acute complaints.  Trying to lose wt but chronic leg and joint pain make this difficult Past Medical History  Diagnosis Date  . ALLERGIC RHINITIS 06/24/2007  . 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 ANXIETY 06/24/2007  . CELLULITIS AND ABSCESS OF LEG EXCEPT FOOT 03/28/2010  . CEREBROVASCULAR ACCIDENT, HX OF 06/24/2007  . COLONIC POLYPS, HX OF 06/24/2007  . COMMON MIGRAINE 06/24/2007  . CONSTIPATION, CHRONIC, HX OF 06/24/2007  . DEPRESSION 06/24/2007  . DIABETES MELLITUS, TYPE II 07/21/2008  . DIVERTICULOSIS, COLON 06/24/2007  . HYPERLIPIDEMIA 06/24/2007  . HYPERTENSION 06/24/2007  . INSOMNIA-SLEEP DISORDER-UNSPEC 01/24/2010  . OBSTRUCTIVE SLEEP APNEA 06/24/2007  . OTHER POSTSURGICAL STATUS OTHER 06/24/2007  . SKIN LESION 01/24/2010  . Unspecified Peripheral Vascular Disease 06/24/2007  . Claudication 06/02/2011   Past Surgical History  Procedure Date  . C spine fusion after job related injury 1985  . Bilat knee surgury 1990  . Hand surgury 1999  . Lumbar laminectomy     reports that he has been smoking.  He does not have any smokeless tobacco history on file. He reports that he does not drink alcohol or use illicit  drugs. family history includes COPD in his father; Cancer in his mother; and Heart disease in his maternal grandfather and maternal grandmother. Allergies  Allergen Reactions  . Oxycodone Other (See Comments)    Sedation/somnolence   Current Outpatient Prescriptions on File Prior to Visit  Medication Sig Dispense Refill  . amLODipine (NORVASC) 10 MG tablet Take 1 tablet (10 mg total) by mouth daily.  90 tablet  3  . aspirin 81 MG tablet Take 81 mg by mouth daily.        Marland Kitchen atorvastatin (LIPITOR) 80 MG tablet Take 1 tablet (80 mg total) by mouth daily.  90 tablet  3  . benazepril (LOTENSIN) 40 MG tablet Take 1 tablet (40 mg total) by mouth daily.  90 tablet  3  . fenofibrate 160 MG tablet Take 1 tablet (160 mg total) by mouth daily.  90 tablet  3  . glimepiride (AMARYL) 4 MG tablet Take 1 tablet (4 mg total) by mouth 2 (two) times daily.  180 tablet  3  . labetalol (NORMODYNE) 300 MG tablet Take 1 tablet (300 mg total) by mouth 2 (two) times daily.  180 tablet  3  . metFORMIN (GLUCOPHAGE) 500 MG tablet Take 4 tabs by mouth daily  360 tablet  3   Review of Systems Review of Systems  Constitutional: Negative for diaphoresis and unexpected weight change.  HENT: Negative for drooling and tinnitus.   Eyes: Negative for photophobia and visual disturbance.  Respiratory: Negative for choking and stridor.   Gastrointestinal: Negative for vomiting  and blood in stool.  Genitourinary: Negative for hematuria and decreased urine volume.       Objective:   Physical Exam BP 120/60  Pulse 89  Temp(Src) 98.6 F (37 C) (Oral)  Ht 5\' 11"  (1.803 m)  Wt 309 lb 8 oz (140.388 kg)  BMI 43.17 kg/m2  SpO2 93% Physical Exam  VS noted Constitutional: Pt appears well-developed and well-nourished.  HENT: Head: Normocephalic.  Right Ear: External ear normal.  Left Ear: External ear normal.  Eyes: Conjunctivae and EOM are normal. Pupils are equal, round, and reactive to light.  Neck: Normal range of motion.  Neck supple.  Cardiovascular: Normal rate and regular rhythm.   Pulmonary/Chest: Effort normal and breath sounds normal.  Neurological: Pt is alert. No cranial nerve deficit.  Skin: Skin is warm. No erythema. No LE edema Psychiatric: Pt behavior is normal. Thought content normal. 1+ nervous    Assessment & Plan:

## 2011-09-18 NOTE — Patient Instructions (Signed)
OK to finish any Remus Loffler you may have Take all new medications as prescribed  - the clonazepam for sleep as needed Continue all other medications as before Please return in approx 3 mo with Lab testing done 3-5 days before

## 2011-09-18 NOTE — Assessment & Plan Note (Signed)
stable overall by hx and exam, most recent data reviewed with pt, and pt to continue medical treatment as before BP Readings from Last 3 Encounters:  09/18/11 120/60  06/21/11 132/80  06/02/11 120/62

## 2011-12-05 ENCOUNTER — Other Ambulatory Visit: Payer: Medicare Other

## 2011-12-05 ENCOUNTER — Ambulatory Visit (INDEPENDENT_AMBULATORY_CARE_PROVIDER_SITE_OTHER): Payer: Medicare Other | Admitting: Internal Medicine

## 2011-12-05 ENCOUNTER — Encounter: Payer: Self-pay | Admitting: Internal Medicine

## 2011-12-05 VITALS — BP 110/62 | HR 85 | Temp 97.1°F | Ht 71.0 in | Wt 310.5 lb

## 2011-12-05 DIAGNOSIS — I10 Essential (primary) hypertension: Secondary | ICD-10-CM

## 2011-12-05 DIAGNOSIS — E785 Hyperlipidemia, unspecified: Secondary | ICD-10-CM

## 2011-12-05 DIAGNOSIS — I739 Peripheral vascular disease, unspecified: Secondary | ICD-10-CM

## 2011-12-05 DIAGNOSIS — E119 Type 2 diabetes mellitus without complications: Secondary | ICD-10-CM

## 2011-12-05 DIAGNOSIS — Z0000 Encounter for general adult medical examination without abnormal findings: Secondary | ICD-10-CM

## 2011-12-05 NOTE — Assessment & Plan Note (Addendum)
stable overall by hx and exam,, and pt to continue medical treatment as before, pt to stop smoking

## 2011-12-05 NOTE — Progress Notes (Signed)
Subjective:    Patient ID: Eric [name], male    DOB: 1951/02/10, 61 y.o.   MRN: [id number]  HPI  Here to f/u; overall doing ok,  Pt denies chest pain, increased sob or doe, wheezing, orthopnea, PND, increased LE swelling, palpitations, dizziness or syncope.  Pt denies new neurological symptoms such as new headache, or facial or extremity weakness or numbness   Pt denies polydipsia, polyuria, or low sugar symptoms such as weakness or confusion improved with po intake.  Pt states overall good compliance with meds, trying to follow lower cholesterol, diabetic diet, wt overall up several lbs and little exercise however.  In fact has had some claudication and left [place] pacin [place] the [place] with son walking.  Still smoking.  Tolerating the fenobrate for elev TG last visit. Did not have labs done prior to this visit.  Has had vascular eval approx 2 yrs ago, felt to be high risk and no surgury discussed. Past Medical History  Diagnosis Date  . ALLERGIC RHINITIS 06/24/2007  . 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 ANXIETY 06/24/2007  . CELLULITIS AND ABSCESS OF LEG EXCEPT FOOT 03/28/2010  . CEREBROVASCULAR ACCIDENT, HX OF 06/24/2007  . COLONIC POLYPS, HX OF 06/24/2007  . COMMON MIGRAINE 06/24/2007  . CONSTIPATION, CHRONIC, HX OF 06/24/2007  . DEPRESSION 06/24/2007  . DIABETES MELLITUS, TYPE II 07/21/2008  . DIVERTICULOSIS, COLON 06/24/2007  . HYPERLIPIDEMIA 06/24/2007  . HYPERTENSION 06/24/2007  . INSOMNIA-SLEEP DISORDER-UNSPEC 01/24/2010  . OBSTRUCTIVE SLEEP APNEA 06/24/2007  . OTHER POSTSURGICAL STATUS OTHER 06/24/2007  . SKIN LESION 01/24/2010  . Unspecified Peripheral Vascular Disease 06/24/2007  . Claudication 06/02/2011   Past Surgical History  Procedure Date  . C spine fusion after job related injury 1985  . Bilat knee surgury 1990  . Hand surgury 1999  . Lumbar laminectomy     reports that he has been smoking.  He does not have any smokeless tobacco history on file. He reports that he does not drink  alcohol or use illicit drugs. family history includes COPD in his father; Cancer in his mother; and Heart disease in his maternal grandfather and maternal grandmother. Allergies  Allergen Reactions  . Oxycodone Other (See Comments)    Sedation/somnolence   Current Outpatient Prescriptions on File Prior to Visit  Medication Sig Dispense Refill  . amLODipine (NORVASC) 10 MG tablet Take 1 tablet (10 mg total) by mouth daily.  90 tablet  3  . aspirin 81 MG tablet Take 81 mg by mouth daily.        Marland Kitchen atorvastatin (LIPITOR) 80 MG tablet Take 1 tablet (80 mg total) by mouth daily.  90 tablet  3  . benazepril (LOTENSIN) 40 MG tablet Take 1 tablet (40 mg total) by mouth daily.  90 tablet  3  . clonazePAM (KLONOPIN) 0.5 MG tablet Take 1-2 tabs at night as needed for sleep  60 tablet  2  . fenofibrate 160 MG tablet Take 1 tablet (160 mg total) by mouth daily.  90 tablet  3  . glimepiride (AMARYL) 4 MG tablet Take 1 tablet (4 mg total) by mouth 2 (two) times daily.  180 tablet  3  . labetalol (NORMODYNE) 300 MG tablet Take 1 tablet (300 mg total) by mouth 2 (two) times daily.  180 tablet  3  . metFORMIN (GLUCOPHAGE) 500 MG tablet Take 4 tabs by mouth daily  360 tablet  3   Review of Systems Review of Systems  Constitutional: Negative for diaphoresis  and unexpected weight change.  Gastrointestinal: Negative for vomiting and blood in stool.  Genitourinary: Negative for hematuria and decreased urine volume.  Musculoskeletal: Negative for gait problem.  Skin: Negative for color change and wound.  Neurological: Negative for tremors and numbness.  Psychiatric/Behavioral: Negative for decreased concentration. The patient is not hyperactive.       Objective:   Physical Exam BP 110/62  Pulse 85  Temp(Src) 97.1 F (36.2 C) (Oral)  Ht 5\' 11"  (1.803 m)  Wt 310 lb 8 oz (140.842 kg)  BMI 43.31 kg/m2  SpO2 90% Physical Exam  VS noted Constitutional: Pt appears well-developed and well-nourished.  HENT:  Head: Normocephalic.  Right Ear: External ear normal.  Left Ear: External ear normal.  Eyes: Conjunctivae and EOM are normal. Pupils are equal, round, and reactive to light.  Neck: Normal range of motion. Neck supple.  Cardiovascular: Normal rate and regular rhythm.   Pulmonary/Chest: Effort normal and breath sounds normal.  Abd:  Soft, NT, non-distended, + BS Neurological: Pt is alert. Motor/dtr intact Skin: Skin is warm. No erythema. trace edema RLE only, mult varicosities bilat Psychiatric: Pt behavior is normal. Thought content normal. 1+nervous    Assessment & Plan:

## 2011-12-05 NOTE — Assessment & Plan Note (Signed)
stable overall by hx and exam, most recent data reviewed with pt, and pt to continue medical treatment as before Lab Results  Component Value Date   HGBA1C 6.7* 06/02/2011   For labs today, goal ldl < 7

## 2011-12-05 NOTE — Assessment & Plan Note (Signed)
stable overall by hx and exam, most recent data reviewed with pt, and pt to continue medical treatment as before  BP Readings from Last 3 Encounters:  12/05/11 110/62  09/18/11 120/60  06/21/11 132/80

## 2011-12-05 NOTE — Assessment & Plan Note (Signed)
With addition of fenofibrate last visit - for lipid f/u - o/w stable overall by hx and exam, most recent data reviewed with pt, and pt to continue medical treatment as before

## 2011-12-05 NOTE — Patient Instructions (Signed)
Continue all other medications as before Please have the pharmacy call with any refills you may need. Please go to LAB in the Basement for the blood and/or urine tests to be done today You will be contacted by phone if any changes need to be made immediately.  Otherwise, you will receive a letter about your results with an explanation. Please return in 6 mo with Lab testing done 3-5 days before  

## 2011-12-25 ENCOUNTER — Other Ambulatory Visit: Payer: Self-pay | Admitting: Internal Medicine

## 2011-12-25 NOTE — Telephone Encounter (Signed)
Done hardcopy to robin  

## 2011-12-25 NOTE — Telephone Encounter (Signed)
Faxed hardcopy to pharmacy. 

## 2012-04-04 ENCOUNTER — Other Ambulatory Visit: Payer: Self-pay | Admitting: Internal Medicine

## 2012-04-04 NOTE — Telephone Encounter (Signed)
Faxed hardcopy to pharmacy. 

## 2012-04-04 NOTE — Telephone Encounter (Signed)
Done hardcopy to robin  

## 2012-04-28 ENCOUNTER — Encounter (HOSPITAL_COMMUNITY): Payer: Self-pay | Admitting: Emergency Medicine

## 2012-04-28 ENCOUNTER — Emergency Department (HOSPITAL_COMMUNITY): Payer: Medicare Other

## 2012-04-28 ENCOUNTER — Emergency Department (HOSPITAL_COMMUNITY)
Admission: EM | Admit: 2012-04-28 | Discharge: 2012-04-28 | Disposition: A | Discharge status: Home / Self Care | Payer: Medicare Other | Attending: Emergency Medicine | Admitting: Emergency Medicine

## 2012-04-28 DIAGNOSIS — F17200 Nicotine dependence, unspecified, uncomplicated: Secondary | ICD-10-CM | POA: Insufficient documentation

## 2012-04-28 DIAGNOSIS — R071 Chest pain on breathing: Secondary | ICD-10-CM | POA: Insufficient documentation

## 2012-04-28 DIAGNOSIS — W19XXXA Unspecified fall, initial encounter: Secondary | ICD-10-CM | POA: Insufficient documentation

## 2012-04-28 DIAGNOSIS — Z79899 Other long term (current) drug therapy: Secondary | ICD-10-CM | POA: Insufficient documentation

## 2012-04-28 DIAGNOSIS — R0789 Other chest pain: Secondary | ICD-10-CM

## 2012-04-28 DIAGNOSIS — F411 Generalized anxiety disorder: Secondary | ICD-10-CM | POA: Insufficient documentation

## 2012-04-28 DIAGNOSIS — G4733 Obstructive sleep apnea (adult) (pediatric): Secondary | ICD-10-CM | POA: Insufficient documentation

## 2012-04-28 DIAGNOSIS — E119 Type 2 diabetes mellitus without complications: Secondary | ICD-10-CM | POA: Insufficient documentation

## 2012-04-28 DIAGNOSIS — Z7982 Long term (current) use of aspirin: Secondary | ICD-10-CM | POA: Insufficient documentation

## 2012-04-28 DIAGNOSIS — I10 Essential (primary) hypertension: Secondary | ICD-10-CM | POA: Insufficient documentation

## 2012-04-28 MED ORDER — IBUPROFEN 200 MG PO TABS
600.0000 mg | ORAL_TABLET | Freq: Once | ORAL | Status: AC
  Administered 2012-04-28: 600 mg via ORAL
  Filled 2012-04-28: qty 3

## 2012-04-28 MED ORDER — TRAMADOL HCL 50 MG PO TABS: 50.0000 mg | ORAL_TABLET | Freq: Four times a day (QID) | ORAL | Status: AC | PRN

## 2012-04-28 MED ORDER — IBUPROFEN 600 MG PO TABS: 600.0000 mg | ORAL_TABLET | Freq: Four times a day (QID) | ORAL | Status: AC | PRN

## 2012-04-28 NOTE — ED Provider Notes (Signed)
History     CSN: [id number]  Arrival date & time 04/28/12  1657   First MD Initiated Contact with Patient 04/28/12 1720      Chief Complaint  Patient presents with  . Fall  . Chest Pain    right rib pain     HPI Received pt via EMS with c/o loss balance and fell while carrying a box. Pt landed on cement. Pt c/o right Rib pain. EMS placed pt on LSB to get pt out of yard because he could not sit up  Past Medical History  Diagnosis Date  . ALLERGIC RHINITIS 06/24/2007  . 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 ANXIETY 06/24/2007  . CELLULITIS AND ABSCESS OF LEG EXCEPT FOOT 03/28/2010  . CEREBROVASCULAR ACCIDENT, HX OF 06/24/2007  . COLONIC POLYPS, HX OF 06/24/2007  . COMMON MIGRAINE 06/24/2007  . CONSTIPATION, CHRONIC, HX OF 06/24/2007  . DEPRESSION 06/24/2007  . DIABETES MELLITUS, TYPE II 07/21/2008  . DIVERTICULOSIS, COLON 06/24/2007  . HYPERLIPIDEMIA 06/24/2007  . HYPERTENSION 06/24/2007  . INSOMNIA-SLEEP DISORDER-UNSPEC 01/24/2010  . OBSTRUCTIVE SLEEP APNEA 06/24/2007  . OTHER POSTSURGICAL STATUS OTHER 06/24/2007  . SKIN LESION 01/24/2010  . Unspecified Peripheral Vascular Disease 06/24/2007  . Claudication 06/02/2011    Past Surgical History  Procedure Date  . C spine fusion after job related injury 1985  . Bilat knee surgury 1990  . Hand surgury 1999  . Lumbar laminectomy     Family History  Problem Relation Age of Onset  . Cancer Mother     lung cancer  . COPD Father   . Heart disease Maternal Grandmother   . Heart disease Maternal Grandfather     History  Substance Use Topics  . Smoking status: Current Everyday Smoker -- 2.0 packs/day  . Smokeless tobacco: Not on file  . Alcohol Use: No      Review of Systems  All other systems reviewed and are negative.    Allergies  Oxycodone  Home Medications   Current Outpatient Rx  Name Route Sig Dispense Refill  . ACETAMINOPHEN 500 MG PO TABS Oral Take 1,000 mg by mouth every 6 (six) hours as needed. For pain    . AMLODIPINE  BESYLATE 10 MG PO TABS Oral Take 10 mg by mouth daily.    . ASPIRIN 81 MG PO TABS Oral Take 81 mg by mouth daily.      . ATORVASTATIN CALCIUM 80 MG PO TABS Oral Take 80 mg by mouth daily.    Marland Kitchen BENAZEPRIL HCL 40 MG PO TABS Oral Take 40 mg by mouth daily.    Marland Kitchen CLONAZEPAM 0.5 MG PO TABS Oral Take 0.5 mg by mouth at bedtime as needed. For sleep    . FENOFIBRATE 160 MG PO TABS Oral Take 160 mg by mouth daily.    Marland Kitchen GLIMEPIRIDE 4 MG PO TABS Oral Take 4 mg by mouth 2 (two) times daily.    Marland Kitchen LABETALOL HCL 300 MG PO TABS Oral Take 300 mg by mouth 2 (two) times daily.    Marland Kitchen METFORMIN HCL 500 MG PO TABS Oral Take 1,000 mg by mouth 2 (two) times daily with a meal.    . SIMVASTATIN 80 MG PO TABS Oral Take 80 mg by mouth at bedtime.    . IBUPROFEN 600 MG PO TABS Oral Take 1 tablet (600 mg total) by mouth every 6 (six) hours as needed for pain. 30 tablet 0  . TRAMADOL HCL 50 MG PO TABS Oral Take 1 tablet (50 mg  total) by mouth every 6 (six) hours as needed for pain. 15 tablet 0    BP 140/64  Temp 97.7 F (36.5 C) (Oral)  Resp 20  SpO2 94%  Physical Exam  Nursing note and vitals reviewed. Constitutional: He is oriented to person, place, and time. He appears well-developed. No distress.  HENT:  Head: Normocephalic and atraumatic.  Eyes: Pupils are equal, round, and reactive to light.  Neck: Normal range of motion.  Cardiovascular: Normal rate and intact distal pulses.   Pulmonary/Chest: No respiratory distress.    Abdominal: Normal appearance. He exhibits no distension.  Musculoskeletal: Normal range of motion.  Neurological: He is alert and oriented to person, place, and time. No cranial nerve deficit.  Skin: Skin is warm and dry. No rash noted.  Psychiatric: He has a normal mood and affect. His behavior is normal.    ED Course  Procedures (including critical care time) Scheduled Meds:   . ibuprofen  600 mg Oral Once   Continuous Infusions:  PRN Meds:.   Labs Reviewed - No data to  display Dg Ribs Unilateral W/chest Right  04/28/2012  *RADIOLOGY REPORT*  Clinical Data: Larey Seat.  Chest pain.  RIGHT RIBS AND CHEST - 3+ VIEW  Comparison: 08/26/2007.  Findings: The heart is borderline in size but stable.  There is tortuosity and calcification of the thoracic aorta.  There are chronic bronchitic type interstitial changes but no infiltrates or effusion.  No pneumothorax.  Streaky bibasilar atelectasis is present.  Dedicated views of the right ribs demonstrate no definite acute right-sided rib fracture.  IMPRESSION:  1.  No acute cardiopulmonary findings other than bibasilar atelectasis. 2.  No definite acute right-sided rib fractures.   Original Report Authenticated By: P. Loralie Champagne, M.D.      1. Fall   2. Chest wall pain       MDM          Nelia Shi, MD 04/29/12 [phone]

## 2012-04-28 NOTE — ED Notes (Signed)
Received pt via EMS with c/o loss balance and fell while carrying a box. Pt landed on cement. Pt c/o right  Rib pain. EMS placed pt on LSB to get pt out of yard because he could not sit up.

## 2012-04-28 NOTE — ED Notes (Signed)
Patient was released in stable condition.

## 2012-04-28 NOTE — ED Notes (Signed)
Patient was given discharge instructions and discharged with family.

## 2012-06-05 ENCOUNTER — Other Ambulatory Visit (INDEPENDENT_AMBULATORY_CARE_PROVIDER_SITE_OTHER): Payer: Medicare Other

## 2012-06-05 ENCOUNTER — Encounter: Payer: Self-pay | Admitting: Internal Medicine

## 2012-06-05 ENCOUNTER — Ambulatory Visit (INDEPENDENT_AMBULATORY_CARE_PROVIDER_SITE_OTHER): Payer: Medicare Other | Admitting: Internal Medicine

## 2012-06-05 VITALS — BP 130/62 | HR 82 | Temp 97.9°F | Ht 69.0 in | Wt 310.0 lb

## 2012-06-05 DIAGNOSIS — Z23 Encounter for immunization: Secondary | ICD-10-CM

## 2012-06-05 DIAGNOSIS — Z0000 Encounter for general adult medical examination without abnormal findings: Secondary | ICD-10-CM

## 2012-06-05 DIAGNOSIS — E785 Hyperlipidemia, unspecified: Secondary | ICD-10-CM

## 2012-06-05 DIAGNOSIS — E119 Type 2 diabetes mellitus without complications: Secondary | ICD-10-CM

## 2012-06-05 DIAGNOSIS — Z79899 Other long term (current) drug therapy: Secondary | ICD-10-CM

## 2012-06-05 DIAGNOSIS — R1011 Right upper quadrant pain: Secondary | ICD-10-CM

## 2012-06-05 DIAGNOSIS — Z125 Encounter for screening for malignant neoplasm of prostate: Secondary | ICD-10-CM

## 2012-06-05 LAB — CBC WITH DIFFERENTIAL/PLATELET
Basophils Absolute: 0 10*3/uL (ref 0.0–0.1)
Basophils Relative: 0.5 % (ref 0.0–3.0)
Eosinophils Absolute: 0.1 10*3/uL (ref 0.0–0.7)
Lymphocytes Relative: 30.4 % (ref 12.0–46.0)
MCHC: 33 g/dL (ref 30.0–36.0)
Neutrophils Relative %: 61 % (ref 43.0–77.0)
RBC: 4.49 Mil/uL (ref 4.22–5.81)
RDW: 13.5 % (ref 11.5–14.6)

## 2012-06-05 LAB — LIPID PANEL
Cholesterol: 183 mg/dL (ref 0–200)
Total CHOL/HDL Ratio: 7

## 2012-06-05 LAB — BASIC METABOLIC PANEL
BUN: 12 mg/dL (ref 6–23)
CO2: 25 mEq/L (ref 19–32)
Chloride: 105 mEq/L (ref 96–112)
Creatinine, Ser: 1 mg/dL (ref 0.4–1.5)

## 2012-06-05 LAB — URINALYSIS, ROUTINE W REFLEX MICROSCOPIC
Hgb urine dipstick: NEGATIVE
Leukocytes, UA: NEGATIVE
Nitrite: NEGATIVE
Urobilinogen, UA: 0.2 (ref 0.0–1.0)

## 2012-06-05 LAB — HEPATIC FUNCTION PANEL
ALT: 35 U/L (ref 0–53)
Bilirubin, Direct: 0.2 mg/dL (ref 0.0–0.3)
Total Protein: 7.2 g/dL (ref 6.0–8.3)

## 2012-06-05 LAB — TSH: TSH: 5.1 u[IU]/mL (ref 0.35–5.50)

## 2012-06-05 LAB — LDL CHOLESTEROL, DIRECT: Direct LDL: 98.9 mg/dL

## 2012-06-05 NOTE — Patient Instructions (Addendum)
You had the flu shot today Continue all other medications as before Please have the pharmacy call with any refills you may need. Please go to LAB in the Basement for the blood and/or urine tests to be done today You will be contacted by phone if any changes need to be made immediately.  Otherwise, you will receive a letter about your results with an explanation. Please remember to sign up for My Chart at your earliest convenience, as this will be important to you in the future with finding out test results. Please return in 6 mo with Lab testing done 3-5 days before

## 2012-06-05 NOTE — Progress Notes (Signed)
Subjective:    Patient ID: Eric [name], male    DOB: [birth year], 61 y.o.   MRN: [id number]  HPI  Here for wellness and f/u;  Overall doing ok;  Pt denies CP, worsening SOB, DOE, wheezing, orthopnea, PND, worsening LE edema, palpitations, dizziness or syncope.  Pt denies neurological change such as new Headache, facial or extremity weakness.  Pt denies polydipsia, polyuria, or low sugar symptoms. Pt states overall good compliance with treatment and medications, good tolerability, and trying to follow lower cholesterol diet.  Pt denies worsening depressive symptoms, suicidal ideation or panic. No fever, wt loss, night sweats, loss of appetite, or other constitutional symptoms.  Pt states good ability with ADL's, low fall risk, home safety reviewed and adequate, no significant changes in hearing or vision, and occasionally active with exercise.  Has had some upper bilat right > left abd pain/costal margin discomfort after an accidental fall. Past Medical History  Diagnosis Date  . ALLERGIC RHINITIS 06/24/2007  . 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 ANXIETY 06/24/2007  . CELLULITIS AND ABSCESS OF LEG EXCEPT FOOT 03/28/2010  . CEREBROVASCULAR ACCIDENT, HX OF 06/24/2007  . COLONIC POLYPS, HX OF 06/24/2007  . COMMON MIGRAINE 06/24/2007  . CONSTIPATION, CHRONIC, HX OF 06/24/2007  . DEPRESSION 06/24/2007  . DIABETES MELLITUS, TYPE II 07/21/2008  . DIVERTICULOSIS, COLON 06/24/2007  . HYPERLIPIDEMIA 06/24/2007  . HYPERTENSION 06/24/2007  . INSOMNIA-SLEEP DISORDER-UNSPEC 01/24/2010  . OBSTRUCTIVE SLEEP APNEA 06/24/2007  . OTHER POSTSURGICAL STATUS OTHER 06/24/2007  . SKIN LESION 01/24/2010  . Unspecified Peripheral Vascular Disease 06/24/2007  . Claudication 06/02/2011   Past Surgical History  Procedure Date  . C spine fusion after job related injury 1985  . Bilat knee surgury 1990  . Hand surgury 1999  . Lumbar laminectomy     reports that he has been smoking.  He does not have any smokeless tobacco history on file. He  reports that he does not drink alcohol or use illicit drugs. family history includes COPD in his father; Cancer in his mother; and Heart disease in his maternal grandfather and maternal grandmother. Allergies  Allergen Reactions  . Oxycodone Other (See Comments)    Sedation/somnolence   Current Outpatient Prescriptions on File Prior to Visit  Medication Sig Dispense Refill  . acetaminophen (TYLENOL) 500 MG tablet Take 1,000 mg by mouth every 6 (six) hours as needed. For pain      . amLODipine (NORVASC) 10 MG tablet Take 10 mg by mouth daily.      Marland Kitchen aspirin 81 MG tablet Take 81 mg by mouth daily.        Marland Kitchen atorvastatin (LIPITOR) 80 MG tablet Take 80 mg by mouth daily.      . benazepril (LOTENSIN) 40 MG tablet Take 40 mg by mouth daily.      . clonazePAM (KLONOPIN) 0.5 MG tablet Take 0.5 mg by mouth at bedtime as needed. For sleep      . fenofibrate 160 MG tablet Take 160 mg by mouth daily.      Marland Kitchen glimepiride (AMARYL) 4 MG tablet Take 4 mg by mouth 2 (two) times daily.      Marland Kitchen labetalol (NORMODYNE) 300 MG tablet Take 300 mg by mouth 2 (two) times daily.      . metFORMIN (GLUCOPHAGE) 500 MG tablet Take 1,000 mg by mouth 2 (two) times daily with a meal.      . simvastatin (ZOCOR) 80 MG tablet Take 80 mg by mouth at bedtime.  Review of Systems Review of Systems  Constitutional: Negative for diaphoresis, activity change, appetite change and unexpected weight change.  HENT: Negative for hearing loss, ear pain, facial swelling, mouth sores and neck stiffness.   Eyes: Negative for pain, redness and visual disturbance.  Respiratory: Negative for shortness of breath and wheezing.   Cardiovascular: Negative for chest pain and palpitations.  Gastrointestinal: Negative for diarrhea, blood in stool, abdominal distention and rectal pain.  Genitourinary: Negative for hematuria, flank pain and decreased urine volume.  Musculoskeletal: Negative for myalgias and joint swelling.  Skin: Negative for  color change and wound.  Neurological: Negative for syncope and numbness.  Hematological: Negative for adenopathy.  Psychiatric/Behavioral: Negative for hallucinations, self-injury, decreased concentration and agitation.      Objective:   Physical Exam BP 130/62  Pulse 82  Temp 97.9 F (36.6 C) (Oral)  Ht 5\' 9"  (1.753 m)  Wt 310 lb (140.615 kg)  BMI 45.78 kg/m2  SpO2 94% Physical Exam  VS noted Constitutional: Pt is oriented to person, place, and time. Appears well-developed and well-nourished.  HENT:  Head: Normocephalic and atraumatic.  Right Ear: External ear normal.  Left Ear: External ear normal.  Nose: Nose normal.  Mouth/Throat: Oropharynx is clear and moist.  Eyes: Conjunctivae and EOM are normal. Pupils are equal, round, and reactive to light.  Neck: Normal range of motion. Neck supple. No JVD present. No tracheal deviation present.  Cardiovascular: Normal rate, regular rhythm, normal heart sounds and intact distal pulses.   Pulmonary/Chest: Effort normal and breath sounds normal.  Abdominal: Soft. Bowel sounds are normal. There is no tenderness.  Musculoskeletal: Normal range of motion. Exhibits no edema.  Lymphadenopathy:  Has no cervical adenopathy.  Neurological: Pt is alert and oriented to person, place, and time. Pt has normal reflexes. No cranial nerve deficit.  Skin: Skin is warm and dry. No rash noted.  Psychiatric:  Has  normal mood and affect. Behavior is normal.     Assessment & Plan:

## 2012-06-05 NOTE — Assessment & Plan Note (Signed)
Hx most c/w msk, for labs today, declines abd u/s, decliens flexeril for now

## 2012-06-06 ENCOUNTER — Encounter: Payer: Self-pay | Admitting: Internal Medicine

## 2012-06-06 ENCOUNTER — Other Ambulatory Visit: Payer: Self-pay | Admitting: Internal Medicine

## 2012-06-09 ENCOUNTER — Encounter: Payer: Self-pay | Admitting: Internal Medicine

## 2012-06-09 NOTE — Assessment & Plan Note (Signed)
stable overall by hx and exam, most recent data reviewed with pt, and pt to continue medical treatment as before Lab Results  Component Value Date   HGBA1C 7.6* 06/05/2012

## 2012-06-09 NOTE — Assessment & Plan Note (Signed)

## 2012-07-08 ENCOUNTER — Other Ambulatory Visit: Payer: Self-pay | Admitting: Internal Medicine

## 2012-08-04 ENCOUNTER — Other Ambulatory Visit: Payer: Self-pay | Admitting: Internal Medicine

## 2012-09-16 ENCOUNTER — Encounter: Payer: Self-pay | Admitting: Internal Medicine

## 2012-09-16 ENCOUNTER — Ambulatory Visit (INDEPENDENT_AMBULATORY_CARE_PROVIDER_SITE_OTHER): Payer: Medicare Other | Admitting: Internal Medicine

## 2012-09-16 VITALS — BP 138/72 | HR 87 | Temp 97.4°F | Ht 69.0 in | Wt 312.0 lb

## 2012-09-16 DIAGNOSIS — R609 Edema, unspecified: Secondary | ICD-10-CM

## 2012-09-16 DIAGNOSIS — L0390 Cellulitis, unspecified: Secondary | ICD-10-CM

## 2012-09-16 MED ORDER — CEPHALEXIN 500 MG PO CAPS: 500.0000 mg | ORAL_CAPSULE | Freq: Three times a day (TID) | ORAL | Status: DC

## 2012-09-16 MED ORDER — HYDROCHLOROTHIAZIDE 25 MG PO TABS: 25.0000 mg | ORAL_TABLET | Freq: Every day | ORAL | Status: DC

## 2012-09-16 NOTE — Patient Instructions (Signed)
Peripheral Edema You have swelling in your legs (peripheral edema). This swelling is due to excess accumulation of salt and water in your body. Edema may be a sign of heart, kidney or liver disease, or a side effect of a medication. It may also be due to problems in the leg veins. Elevating your legs and using special support stockings may be very helpful, if the cause of the swelling is due to poor venous circulation. Avoid long periods of standing, whatever the cause. Treatment of edema depends on identifying the cause. Chips, pretzels, pickles and other salty foods should be avoided. Restricting salt in your diet is almost always needed. Water pills (diuretics) are often used to remove the excess salt and water from your body via urine. These medicines prevent the kidney from reabsorbing sodium. This increases urine flow. Diuretic treatment may also result in lowering of potassium levels in your body. Potassium supplements may be needed if you have to use diuretics daily. Daily weights can help you keep track of your progress in clearing your edema. You should call your caregiver for follow up care as recommended. SEEK IMMEDIATE MEDICAL CARE IF:    You have increased swelling, pain, redness, or heat in your legs.   You develop shortness of breath, especially when lying down.   You develop chest or abdominal pain, weakness, or fainting.   You have a fever.  Document Released: 09/14/2004 Document Revised: 10/30/2011 Document Reviewed: 08/25/2009 [facility] Patient Information 2013 [place], [place].   Cellulitis Cellulitis is an infection of the skin and the tissue beneath it. The infected area is usually red and tender. Cellulitis occurs most often in the arms and lower legs.   CAUSES   Cellulitis is caused by bacteria that enter the skin through cracks or cuts in the skin. The most common types of bacteria that cause cellulitis are Staphylococcus and Streptococcus. SYMPTOMS    Redness and warmth.    Swelling.   Tenderness or pain.   Fever.  DIAGNOSIS  Your caregiver can usually determine what is wrong based on a physical exam. Blood tests may also be done. TREATMENT   Treatment usually involves taking an antibiotic medicine. HOME CARE INSTRUCTIONS    Take your antibiotics as directed. Finish them even if you start to feel better.   Keep the infected arm or leg elevated to reduce swelling.   Apply a warm cloth to the affected area up to 4 times per day to relieve pain.   Only take over-the-counter or prescription medicines for pain, discomfort, or fever as directed by your caregiver.   Keep all follow-up appointments as directed by your caregiver.  SEEK MEDICAL CARE IF:    You notice red streaks coming from the infected area.   Your red area gets larger or turns dark in color.   Your bone or joint underneath the infected area becomes painful after the skin has healed.   Your infection returns in the same area or another area.   You notice a swollen bump in the infected area.   You develop new symptoms.  SEEK IMMEDIATE MEDICAL CARE IF:    You have a fever.   You feel very sleepy.   You develop vomiting or diarrhea.   You have a general ill feeling (malaise) with muscle aches and pains.  MAKE SURE YOU:    Understand these instructions.   Will watch your condition.   Will get help right away if you are not doing well or get worse.  Document Released: 05/17/2005 Document Revised: 02/06/2012 Document Reviewed: 10/23/2011 [facility] Patient Information 2013 [place], [place].

## 2012-09-17 ENCOUNTER — Encounter: Payer: Self-pay | Admitting: Internal Medicine

## 2012-09-17 NOTE — Progress Notes (Signed)
Subjective:    Patient ID: Eric [name], male    DOB: [birth year], 62 y.o.   MRN: [id number]  HPI  Pt presents to the clinic today with c/o increased swelling and pain in his bilateral lower extremities. He has also noted that his legs are red. He has never had a problem with swelling in the past. He did try to take one of his sister's water pills but did not notice a big difference. He does consume a lot of salt in his diet. He does have high blood pressure.   Review of Systems      Past Medical History  Diagnosis Date  . ALLERGIC RHINITIS 06/24/2007  . 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 ANXIETY 06/24/2007  . CELLULITIS AND ABSCESS OF LEG EXCEPT FOOT 03/28/2010  . CEREBROVASCULAR ACCIDENT, HX OF 06/24/2007  . COLONIC POLYPS, HX OF 06/24/2007  . COMMON MIGRAINE 06/24/2007  . CONSTIPATION, CHRONIC, HX OF 06/24/2007  . DEPRESSION 06/24/2007  . DIABETES MELLITUS, TYPE II 07/21/2008  . DIVERTICULOSIS, COLON 06/24/2007  . HYPERLIPIDEMIA 06/24/2007  . HYPERTENSION 06/24/2007  . INSOMNIA-SLEEP DISORDER-UNSPEC 01/24/2010  . OBSTRUCTIVE SLEEP APNEA 06/24/2007  . OTHER POSTSURGICAL STATUS OTHER 06/24/2007  . SKIN LESION 01/24/2010  . Unspecified Peripheral Vascular Disease 06/24/2007  . Claudication 06/02/2011    Current Outpatient Prescriptions  Medication Sig Dispense Refill  . acetaminophen (TYLENOL) 500 MG tablet Take 1,000 mg by mouth every 6 (six) hours as needed. For pain      . amLODipine (NORVASC) 10 MG tablet TAKE ONE TABLET BY MOUTH EVERY DAY  90 tablet  3  . aspirin 81 MG tablet Take 81 mg by mouth daily.        Marland Kitchen atorvastatin (LIPITOR) 80 MG tablet Take 80 mg by mouth daily.      . benazepril (LOTENSIN) 40 MG tablet TAKE ONE TABLET BY MOUTH EVERY DAY  90 tablet  3  . clonazePAM (KLONOPIN) 0.5 MG tablet Take 0.5 mg by mouth at bedtime as needed. For sleep      . fenofibrate 160 MG tablet TAKE ONE TABLET BY MOUTH EVERY DAY  90 tablet  3  . glimepiride (AMARYL) 4 MG tablet TAKE ONE TABLET BY MOUTH  TWICE DAILY  180 tablet  3  . labetalol (NORMODYNE) 300 MG tablet TAKE ONE TABLET BY MOUTH TWICE DAILY  180 tablet  3  . metFORMIN (GLUCOPHAGE) 500 MG tablet TAKE FOUR TABLETS BY MOUTH EVERY DAY  360 tablet  3  . simvastatin (ZOCOR) 80 MG tablet TAKE ONE TABLET BY MOUTH EVERY DAY AT BEDTIME  90 tablet  1  . cephALEXin (KEFLEX) 500 MG capsule Take 1 capsule (500 mg total) by mouth 3 (three) times daily.  15 capsule  0  . hydrochlorothiazide (HYDRODIURIL) 25 MG tablet Take 1 tablet (25 mg total) by mouth daily.  30 tablet  30    Allergies  Allergen Reactions  . Oxycodone Other (See Comments)    Sedation/somnolence    Family History  Problem Relation Age of Onset  . Cancer Mother     lung cancer  . COPD Father   . Heart disease Maternal Grandmother   . Heart disease Maternal Grandfather     History   Social History  . Marital Status: Married    Spouse Name: N/A    Number of Children: 3  . Years of Education: N/A   Occupational History  . 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 Smoking status: Current Every Day Smoker -- 2.0 packs/day  . Smokeless tobacco: Not on file  . Alcohol Use: No  . Drug Use: No  . Sexually Active: Not on file   Other Topics Concern  . Not on file   Social History Narrative  . No narrative on file     Constitutional: Denies fever, malaise, fatigue, headache or abrupt weight changes.  Respiratory: Denies difficulty breathing, shortness of breath, cough or sputum production.   Cardiovascular: Pt reports swelling of his bilateral legs. Denies chest pain, chest tightness, palpitations.  Skin: Pt reports redness in her legs. Denies rashes, lesions or ulcercations.  Neurological: Denies dizziness, difficulty with memory, difficulty with speech or problems with balance and coordination.   No other specific complaints in a complete review of systems (except as listed in HPI  above).  Objective:   Physical Exam   BP 138/72  Pulse 87  Temp 97.4 F (36.3 C) (Oral)  Ht 5\' 9"  (1.753 m)  Wt 312 lb (141.522 kg)  BMI 46.07 kg/m2  SpO2 93% Wt Readings from Last 3 Encounters:  09/16/12 312 lb (141.522 kg)  06/05/12 310 lb (140.615 kg)  12/05/11 310 lb 8 oz (140.842 kg)    General: Appears their stated age, well developed, well nourished in NAD. Skin: Warm, dry and intact. Cellulitis noted on BLE. No rashes, lesions or ulcerations noted. Cardiovascular: Normal rate and rhythm. S1,S2 noted.  No murmur, rubs or gallops noted. No JVD. 2+ swelling of the BLE. No carotid bruits noted. Pulmonary/Chest: Normal effort and positive vesicular breath sounds. No respiratory distress. No wheezes, rales or ronchi noted.   Neurological: Alert and oriented. Cranial nerves II-XII intact. Coordination normal. +DTRs bilaterally.       Assessment & Plan:   Peripheral edema, new onset with additional workup required:  eRx for HCTZ 25 mg PO daily Try to avoid salt in your diet Wear compression stockings as tolerated  Cellulitis of bilateral lower extremities, new onset with additional workup required:  eRx for Keflex  RTC as needed or if symptoms persist

## 2012-12-14 ENCOUNTER — Other Ambulatory Visit: Payer: Self-pay | Admitting: Internal Medicine

## 2012-12-23 ENCOUNTER — Emergency Department (HOSPITAL_COMMUNITY): Payer: Medicare Other

## 2012-12-23 ENCOUNTER — Encounter (HOSPITAL_COMMUNITY): Payer: Self-pay | Admitting: Emergency Medicine

## 2012-12-23 ENCOUNTER — Emergency Department (HOSPITAL_COMMUNITY)
Admission: EM | Admit: 2012-12-23 | Discharge: 2012-12-23 | Disposition: A | Discharge status: Home / Self Care | Payer: Medicare Other | Attending: Emergency Medicine | Admitting: Emergency Medicine

## 2012-12-23 DIAGNOSIS — R059 Cough, unspecified: Secondary | ICD-10-CM | POA: Insufficient documentation

## 2012-12-23 DIAGNOSIS — G4733 Obstructive sleep apnea (adult) (pediatric): Secondary | ICD-10-CM | POA: Insufficient documentation

## 2012-12-23 DIAGNOSIS — F411 Generalized anxiety disorder: Secondary | ICD-10-CM | POA: Insufficient documentation

## 2012-12-23 DIAGNOSIS — T783XXA Angioneurotic edema, initial encounter: Secondary | ICD-10-CM

## 2012-12-23 DIAGNOSIS — F329 Major depressive disorder, single episode, unspecified: Secondary | ICD-10-CM | POA: Insufficient documentation

## 2012-12-23 DIAGNOSIS — Z8719 Personal history of other diseases of the digestive system: Secondary | ICD-10-CM | POA: Insufficient documentation

## 2012-12-23 DIAGNOSIS — Z8709 Personal history of other diseases of the respiratory system: Secondary | ICD-10-CM | POA: Insufficient documentation

## 2012-12-23 DIAGNOSIS — E785 Hyperlipidemia, unspecified: Secondary | ICD-10-CM | POA: Insufficient documentation

## 2012-12-23 DIAGNOSIS — T4995XA Adverse effect of unspecified topical agent, initial encounter: Secondary | ICD-10-CM | POA: Insufficient documentation

## 2012-12-23 DIAGNOSIS — J189 Pneumonia, unspecified organism: Secondary | ICD-10-CM

## 2012-12-23 DIAGNOSIS — Z860100 Personal history of colon polyps, unspecified: Secondary | ICD-10-CM | POA: Insufficient documentation

## 2012-12-23 DIAGNOSIS — Z872 Personal history of diseases of the skin and subcutaneous tissue: Secondary | ICD-10-CM | POA: Insufficient documentation

## 2012-12-23 DIAGNOSIS — E119 Type 2 diabetes mellitus without complications: Secondary | ICD-10-CM | POA: Insufficient documentation

## 2012-12-23 DIAGNOSIS — IMO0002 Reserved for concepts with insufficient information to code with codable children: Secondary | ICD-10-CM | POA: Insufficient documentation

## 2012-12-23 DIAGNOSIS — R6889 Other general symptoms and signs: Secondary | ICD-10-CM | POA: Insufficient documentation

## 2012-12-23 DIAGNOSIS — G4700 Insomnia, unspecified: Secondary | ICD-10-CM | POA: Insufficient documentation

## 2012-12-23 DIAGNOSIS — Z7982 Long term (current) use of aspirin: Secondary | ICD-10-CM | POA: Insufficient documentation

## 2012-12-23 DIAGNOSIS — J3489 Other specified disorders of nose and nasal sinuses: Secondary | ICD-10-CM | POA: Insufficient documentation

## 2012-12-23 DIAGNOSIS — F17200 Nicotine dependence, unspecified, uncomplicated: Secondary | ICD-10-CM | POA: Insufficient documentation

## 2012-12-23 DIAGNOSIS — F3289 Other specified depressive episodes: Secondary | ICD-10-CM | POA: Insufficient documentation

## 2012-12-23 DIAGNOSIS — I2510 Atherosclerotic heart disease of native coronary artery without angina pectoris: Secondary | ICD-10-CM | POA: Insufficient documentation

## 2012-12-23 DIAGNOSIS — Z79899 Other long term (current) drug therapy: Secondary | ICD-10-CM | POA: Insufficient documentation

## 2012-12-23 DIAGNOSIS — J159 Unspecified bacterial pneumonia: Secondary | ICD-10-CM | POA: Insufficient documentation

## 2012-12-23 DIAGNOSIS — R05 Cough: Secondary | ICD-10-CM | POA: Insufficient documentation

## 2012-12-23 DIAGNOSIS — I739 Peripheral vascular disease, unspecified: Secondary | ICD-10-CM | POA: Insufficient documentation

## 2012-12-23 DIAGNOSIS — I10 Essential (primary) hypertension: Secondary | ICD-10-CM | POA: Insufficient documentation

## 2012-12-23 DIAGNOSIS — Z8673 Personal history of transient ischemic attack (TIA), and cerebral infarction without residual deficits: Secondary | ICD-10-CM | POA: Insufficient documentation

## 2012-12-23 DIAGNOSIS — R062 Wheezing: Secondary | ICD-10-CM | POA: Insufficient documentation

## 2012-12-23 HISTORY — DX: Atherosclerotic heart disease of native coronary artery without angina pectoris: I25.10

## 2012-12-23 LAB — CBC WITH DIFFERENTIAL/PLATELET
Basophils Absolute: 0 10*3/uL (ref 0.0–0.1)
Basophils Relative: 0 % (ref 0–1)
Eosinophils Absolute: 0.2 10*3/uL (ref 0.0–0.7)
Eosinophils Relative: 2 % (ref 0–5)
HCT: 40.5 % (ref 39.0–52.0)
MCH: 34 pg (ref 26.0–34.0)
MCHC: 34.8 g/dL (ref 30.0–36.0)
MCV: 97.6 fL (ref 78.0–100.0)
Monocytes Absolute: 0.4 10*3/uL (ref 0.1–1.0)
Monocytes Relative: 4 % (ref 3–12)
Neutro Abs: 5.6 10*3/uL (ref 1.7–7.7)
RDW: 13.3 % (ref 11.5–15.5)

## 2012-12-23 LAB — BASIC METABOLIC PANEL
BUN: 19 mg/dL (ref 6–23)
Calcium: 9.9 mg/dL (ref 8.4–10.5)
Creatinine, Ser: 0.94 mg/dL (ref 0.50–1.35)
GFR calc Af Amer: >90 mL/min (ref >90)

## 2012-12-23 MED ORDER — AZITHROMYCIN 250 MG PO TABS: 250.0000 mg | ORAL_TABLET | Freq: Every day | ORAL | Status: DC

## 2012-12-23 MED ORDER — DIPHENHYDRAMINE HCL 25 MG PO TABS: 25.0000 mg | ORAL_TABLET | Freq: Four times a day (QID) | ORAL | Status: DC

## 2012-12-23 MED ORDER — DIPHENHYDRAMINE HCL 50 MG/ML IJ SOLN
25.0000 mg | Freq: Once | INTRAMUSCULAR | Status: AC
  Administered 2012-12-23: 25 mg via INTRAVENOUS
  Filled 2012-12-23: qty 1

## 2012-12-23 MED ORDER — AZITHROMYCIN 250 MG PO TABS
500.0000 mg | ORAL_TABLET | Freq: Once | ORAL | Status: AC
  Administered 2012-12-23: 500 mg via ORAL
  Filled 2012-12-23: qty 2

## 2012-12-23 MED ORDER — FAMOTIDINE 20 MG PO TABS: 20.0000 mg | ORAL_TABLET | Freq: Two times a day (BID) | ORAL | Status: DC

## 2012-12-23 MED ORDER — METHYLPREDNISOLONE SODIUM SUCC 125 MG IJ SOLR
125.0000 mg | Freq: Once | INTRAMUSCULAR | Status: AC
  Administered 2012-12-23: 125 mg via INTRAVENOUS
  Filled 2012-12-23: qty 2

## 2012-12-23 MED ORDER — PREDNISONE 20 MG PO TABS: 60.0000 mg | ORAL_TABLET | Freq: Every day | ORAL | Status: DC

## 2012-12-23 MED ORDER — FAMOTIDINE IN NACL 20-0.9 MG/50ML-% IV SOLN
20.0000 mg | Freq: Once | INTRAVENOUS | Status: AC
  Administered 2012-12-23: 20 mg via INTRAVENOUS
  Filled 2012-12-23: qty 50

## 2012-12-23 NOTE — ED Notes (Signed)
Lab at bedside

## 2012-12-23 NOTE — ED Notes (Signed)
TRIAGE NURSE ROUNDS: PT. SITTING AT WAITING AREA , RESPIRATIONS UNLABORED / AIRWAY INTACT , DENIES PAIN , NURSE EXPLAINED DELAY AND PROCESS . NO PROGRESSION ON TONGUE SWELLING.

## 2012-12-23 NOTE — ED Provider Notes (Signed)
History     CSN: [id number]  Arrival date & time 12/23/12  0117   First MD Initiated Contact with Patient 12/23/12 0303      Chief Complaint  Patient presents with  . Angioedema    (Consider location/radiation/quality/duration/timing/severity/associated sxs/prior treatment) HPI 62 year old male presents to emergency room with complaint of right tongue swelling.  He reports over the last 2 days he has noticed that the right side of his tongue has had slight swelling.  Tonight the swelling got much worse.  Since arrival in the emergency department, he reports the area under his tongue is swollen.  He is having shortness of breath.  He denies previous history of angioedema.  Patient reports over the last 10 days.  He has had increased cough, wheezing, upper respiratory symptoms with runny nose and congestion.  He has been taking over-the-counter medication without improvement in symptoms.  Patient reports he has required oxygen in the past, however, he was taken off of this by his primary care Dr.  No fevers no chills.  Past Medical History  Diagnosis Date  . ALLERGIC RHINITIS 06/24/2007  . 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 ANXIETY 06/24/2007  . CELLULITIS AND ABSCESS OF LEG EXCEPT FOOT 03/28/2010  . CEREBROVASCULAR ACCIDENT, HX OF 06/24/2007  . COLONIC POLYPS, HX OF 06/24/2007  . COMMON MIGRAINE 06/24/2007  . CONSTIPATION, CHRONIC, HX OF 06/24/2007  . DEPRESSION 06/24/2007  . DIABETES MELLITUS, TYPE II 07/21/2008  . DIVERTICULOSIS, COLON 06/24/2007  . HYPERLIPIDEMIA 06/24/2007  . HYPERTENSION 06/24/2007  . INSOMNIA-SLEEP DISORDER-UNSPEC 01/24/2010  . OBSTRUCTIVE SLEEP APNEA 06/24/2007  . OTHER POSTSURGICAL STATUS OTHER 06/24/2007  . SKIN LESION 01/24/2010  . Unspecified Peripheral Vascular Disease 06/24/2007  . Claudication 06/02/2011  . Coronary artery disease     Past Surgical History  Procedure Laterality Date  . C spine fusion after job related injury  1985  . Bilat knee surgury  1990  . Hand  surgury  1999  . Lumbar laminectomy      Family History  Problem Relation Age of Onset  . Cancer Mother     lung cancer  . COPD Father   . Heart disease Maternal Grandmother   . Heart disease Maternal Grandfather     History  Substance Use Topics  . Smoking status: Current Every Day Smoker -- 2.00 packs/day  . Smokeless tobacco: Not on file  . Alcohol Use: No      Review of Systems  All other systems reviewed and are negative.    Allergies  Oxycodone  Home Medications   Current Outpatient Rx  Name  Route  Sig  Dispense  Refill  . amLODipine (NORVASC) 10 MG tablet   Oral   Take 10 mg by mouth daily.         Marland Kitchen aspirin 81 MG tablet   Oral   Take 81 mg by mouth daily.           . clonazePAM (KLONOPIN) 0.5 MG tablet   Oral   Take 0.5-1 mg by mouth at bedtime. Pt can take up to 2 depending on anxiety level         . fenofibrate 160 MG tablet   Oral   Take 160 mg by mouth daily.         Marland Kitchen glimepiride (AMARYL) 4 MG tablet   Oral   Take 4 mg by mouth 2 (two) times daily.         . hydrochlorothiazide (HYDRODIURIL) 25 MG tablet  Oral   Take 1 tablet (25 mg total) by mouth daily.   30 tablet   30   . labetalol (NORMODYNE) 300 MG tablet   Oral   Take 300 mg by mouth 2 (two) times daily.         . metFORMIN (GLUCOPHAGE) 500 MG tablet   Oral   Take 2,000 mg by mouth daily with breakfast.         . simvastatin (ZOCOR) 80 MG tablet   Oral   Take 80 mg by mouth at bedtime.         Marland Kitchen azithromycin (ZITHROMAX) 250 MG tablet   Oral   Take 1 tablet (250 mg total) by mouth daily. For 4 days starting tomorrow   4 tablet   0   . diphenhydrAMINE (BENADRYL) 25 MG tablet   Oral   Take 1 tablet (25 mg total) by mouth every 6 (six) hours.   20 tablet   0   . famotidine (PEPCID) 20 MG tablet   Oral   Take 1 tablet (20 mg total) by mouth 2 (two) times daily.   30 tablet   0   . predniSONE (DELTASONE) 20 MG tablet   Oral   Take 3 tablets (60  mg total) by mouth daily.   15 tablet   0     BP 124/87  Pulse 76  Temp(Src) 98.1 F (36.7 C) (Oral)  Resp 21  SpO2 93%  Physical Exam  HENT:  Head: Normocephalic.  Nose: Nose normal.  Angioedema noted to right tongue, submental area.  Pt is able to handle his own secretions, no stridor or respiratory distress  Eyes: Conjunctivae and EOM are normal. Pupils are equal, round, and reactive to light.  Neck: Normal range of motion. Neck supple. No JVD present. No tracheal deviation present. No thyromegaly present.  Cardiovascular: Normal rate, regular rhythm, normal heart sounds and intact distal pulses.  Exam reveals no gallop and no friction rub.   No murmur heard. Pulmonary/Chest: Effort normal and breath sounds normal. No stridor. He has no wheezes. He has no rales. He exhibits no tenderness.  Cough noted  Abdominal: Soft. Bowel sounds are normal. He exhibits no distension and no mass. There is no tenderness. There is no rebound and no guarding.  Musculoskeletal: Normal range of motion. He exhibits no tenderness.  Lymphadenopathy:    He has no cervical adenopathy.  Skin: Skin is warm and dry. No rash noted. No erythema. No pallor.    ED Course  Procedures (including critical care time)  Labs Reviewed  CBC WITH DIFFERENTIAL - Abnormal; Notable for the following:    RBC 4.15 (*)    All other components within normal limits  BASIC METABOLIC PANEL - Abnormal; Notable for the following:    Sodium 132 (*)    Chloride 95 (*)    Glucose, Bld 116 (*)    GFR calc non Af Amer 88 (*)    All other components within normal limits   Dg Chest Port 1 View  12/23/2012  *RADIOLOGY REPORT*  Clinical Data: 62 year old male shortness of breath and cough.  PORTABLE CHEST - 1 VIEW  Comparison: 04/28/2012 and earlier.  Findings: Portable upright AP view at 0338 hours.  Stable lung volumes.  Stable cardiac size and mediastinal contours.  Visualized tracheal air column is within normal limits.  No  pneumothorax.  No pleural effusion.  Streaky bibasilar opacity appears increased from prior studies, although there is a degree of chronic  increased markings at the lung bases. Opacity is somewhat reticular nodular on the left.  IMPRESSION: Acute-on-chronic increased streaky opacity at the lung bases. Developing lung base infection cannot be excluded, particularly on the left.   Original Report Authenticated By: Erskine Speed, M.D.      1. Angioedema, initial encounter   2. CAP (community acquired pneumonia)       MDM  62 yo male with angioedema, on ramopril for some time.  Suspect ACE inhibitor induced.  Submental swelling has resolved, tongue much improved.  Possible mild early CAP with persistent sxs for 10 days and borderline sats here.  Will tx with zithromax.       Olivia Mackie, MD 12/23/12 [phone]

## 2012-12-23 NOTE — ED Notes (Signed)
Pt requesting ice water.  Per Dr. Norlene Campbell pt to remain NPO at this time.  Pt notified of same.

## 2012-12-23 NOTE — ED Notes (Signed)
PT. REPORTS TONGUE SWELLING ONSET TODAY , RESPIRATIONS UNLABORED / AIRWAY INTACT , DENIES NEW PRESCRIPTION MED. PT. STATED HE HAS BEEN TAKING OTC MEDS FOR COLD SYMPTOMS RECENTLY. DENIES CHEST PAIN OR SOB.

## 2012-12-26 ENCOUNTER — Other Ambulatory Visit (INDEPENDENT_AMBULATORY_CARE_PROVIDER_SITE_OTHER): Payer: Medicare Other

## 2012-12-26 ENCOUNTER — Ambulatory Visit (INDEPENDENT_AMBULATORY_CARE_PROVIDER_SITE_OTHER)
Admission: RE | Admit: 2012-12-26 | Discharge: 2012-12-26 | Disposition: A | Discharge status: Home / Self Care | Payer: Medicare Other | Source: Ambulatory Visit | Attending: Internal Medicine | Admitting: Internal Medicine

## 2012-12-26 ENCOUNTER — Ambulatory Visit (INDEPENDENT_AMBULATORY_CARE_PROVIDER_SITE_OTHER): Payer: Medicare Other | Admitting: Internal Medicine

## 2012-12-26 ENCOUNTER — Encounter: Payer: Self-pay | Admitting: Internal Medicine

## 2012-12-26 ENCOUNTER — Other Ambulatory Visit: Payer: Self-pay | Admitting: Internal Medicine

## 2012-12-26 VITALS — BP 130/68 | HR 92 | Temp 97.5°F | Ht 71.0 in | Wt 305.2 lb

## 2012-12-26 DIAGNOSIS — R0989 Other specified symptoms and signs involving the circulatory and respiratory systems: Secondary | ICD-10-CM

## 2012-12-26 DIAGNOSIS — I10 Essential (primary) hypertension: Secondary | ICD-10-CM

## 2012-12-26 DIAGNOSIS — E785 Hyperlipidemia, unspecified: Secondary | ICD-10-CM

## 2012-12-26 DIAGNOSIS — R0609 Other forms of dyspnea: Secondary | ICD-10-CM

## 2012-12-26 DIAGNOSIS — R0600 Dyspnea, unspecified: Secondary | ICD-10-CM

## 2012-12-26 DIAGNOSIS — E119 Type 2 diabetes mellitus without complications: Secondary | ICD-10-CM

## 2012-12-26 LAB — BASIC METABOLIC PANEL
CO2: 24 mEq/L (ref 19–32)
Calcium: 9.7 mg/dL (ref 8.4–10.5)
GFR: 54.88 mL/min — ABNORMAL LOW (ref >60.00)
Glucose, Bld: 241 mg/dL — ABNORMAL HIGH (ref 70–99)
Potassium: 4.4 mEq/L (ref 3.5–5.1)
Sodium: 135 mEq/L (ref 135–145)

## 2012-12-26 LAB — HEMOGLOBIN A1C: Hgb A1c MFr Bld: 8 % — ABNORMAL HIGH (ref 4.6–6.5)

## 2012-12-26 LAB — LIPID PANEL: Total CHOL/HDL Ratio: 4

## 2012-12-26 MED ORDER — ALBUTEROL SULFATE HFA 108 (90 BASE) MCG/ACT IN AERS: 2.0000 | INHALATION_SPRAY | Freq: Four times a day (QID) | RESPIRATORY_TRACT | Status: DC | PRN

## 2012-12-26 MED ORDER — BUDESONIDE-FORMOTEROL FUMARATE 160-4.5 MCG/ACT IN AERO: 2.0000 | INHALATION_SPRAY | Freq: Two times a day (BID) | RESPIRATORY_TRACT | Status: DC

## 2012-12-26 MED ORDER — LINAGLIPTIN 5 MG PO TABS: 5.0000 mg | ORAL_TABLET | Freq: Every day | ORAL | Status: DC

## 2012-12-26 NOTE — Assessment & Plan Note (Addendum)
Etiology unclear, has copd and ? Hx of CAD/MI per pt but none documented here, has seen Dr Dorise Bullion in the past (whom the pt subsequently "fired"); for now will tx as untreated COPD (poss COAD) with ? Seasonal flare - gave sample of symbicort 2 puff bid, and ventolin HFA prn, ECG reviewed as per emr, for PFT's and echo, no recent CP so hold on stress testing but seems likely to have CAD given hx of PAD (for which he has been told he is high risk for stent intervention so not done so far, only to be done per pt under life threatening condition); also for repeat CXR PA and Lat given the ? Ashby Dawes of the recent port CXR in the ER  Note:  Total time for pt hx, exam, review of record with pt in the room, determination of diagnoses and plan for further eval and tx is > 40 min, with over 50% spent in coordination and counseling of patient

## 2012-12-26 NOTE — Patient Instructions (Addendum)
Your EKG was OK today Please take all new medication as prescribed - the symbicort at 2 puffs twice per day, and Ventolin inhaler at 2 puffs up to 4 times per day as needed only Please continue all other medications as before, and refills have been done if requested. Please have the pharmacy call with any other refills you may need. Please go to the XRAY Department in the Basement (go straight as you get off the elevator) for the x-ray testing Please go to the LAB in the Basement (turn left off the elevator) for the tests to be done today You will be contacted regarding the referral for: PFT's (lung testing), and Echocardiogram OK to stay off the ramipril medication for now, as we can hold off on other medication to replace for now Please remember to sign up for My Chart if you have not done so, as this will be important to you in the future with finding out test results, communicating by private email, and scheduling acute appointments online when needed. Please return in 1 months, or sooner if needed

## 2012-12-26 NOTE — Progress Notes (Signed)
Subjective:    Patient ID: Eric [name], male    DOB: January 22, 1951, 62 y.o.   MRN: [id number]  HPI  Here to f/u.  Did have ACE stopped in the ER may 5 after episode angioedema, tongue swelling, sob, also ? CAP - tx with zpack, based on ? LLL on portable.  Finishing steroid tx, tongue swelling minimal now.  Also taking HCTZ since jan 2014 with minimal swelling legs since then.  Also with ongoing 1 yr dyspnea with lying flat, could not tolerate the CPAP mask, and has not f/u'd.  Otherwise Pt denies chest pain, increased LE swelling, palpitations, dizziness or syncope.   Pt denies fever, wt loss, night sweats, loss of appetite, or other constitutional symptoms Denies worsening depressive symptoms, suicidal ideation, or panic.  Pt denies polydipsia, polyuria, or low sugar symptoms such as weakness or confusion improved with po intake.  Pt states overall good compliance with meds, trying to follow lower cholesterol, diabetic diet. Past Medical History  Diagnosis Date  . ALLERGIC RHINITIS 06/24/2007  . 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 ANXIETY 06/24/2007  . CELLULITIS AND ABSCESS OF LEG EXCEPT FOOT 03/28/2010  . CEREBROVASCULAR ACCIDENT, HX OF 06/24/2007  . COLONIC POLYPS, HX OF 06/24/2007  . COMMON MIGRAINE 06/24/2007  . CONSTIPATION, CHRONIC, HX OF 06/24/2007  . DEPRESSION 06/24/2007  . DIABETES MELLITUS, TYPE II 07/21/2008  . DIVERTICULOSIS, COLON 06/24/2007  . HYPERLIPIDEMIA 06/24/2007  . HYPERTENSION 06/24/2007  . INSOMNIA-SLEEP DISORDER-UNSPEC 01/24/2010  . OBSTRUCTIVE SLEEP APNEA 06/24/2007  . OTHER POSTSURGICAL STATUS OTHER 06/24/2007  . SKIN LESION 01/24/2010  . Unspecified Peripheral Vascular Disease 06/24/2007  . Claudication 06/02/2011  . Coronary artery disease    Past Surgical History  Procedure Laterality Date  . C spine fusion after job related injury  1985  . Bilat knee surgury  1990  . Hand surgury  1999  . Lumbar laminectomy      reports that he has been smoking.  He does not have any smokeless  tobacco history on file. He reports that he does not drink alcohol or use illicit drugs. family history includes COPD in his father; Cancer in his mother; and Heart disease in his maternal grandfather and maternal grandmother. Allergies  Allergen Reactions  . Oxycodone Other (See Comments)    Sedation/somnolence   Current Outpatient Prescriptions on File Prior to Visit  Medication Sig Dispense Refill  . amLODipine (NORVASC) 10 MG tablet Take 10 mg by mouth daily.      Marland Kitchen aspirin 81 MG tablet Take 81 mg by mouth daily.        Marland Kitchen azithromycin (ZITHROMAX) 250 MG tablet Take 1 tablet (250 mg total) by mouth daily. For 4 days starting tomorrow  4 tablet  0  . clonazePAM (KLONOPIN) 0.5 MG tablet Take 0.5-1 mg by mouth at bedtime. Pt can take up to 2 depending on anxiety level      . diphenhydrAMINE (BENADRYL) 25 MG tablet Take 1 tablet (25 mg total) by mouth every 6 (six) hours.  20 tablet  0  . famotidine (PEPCID) 20 MG tablet Take 1 tablet (20 mg total) by mouth 2 (two) times daily.  30 tablet  0  . fenofibrate 160 MG tablet Take 160 mg by mouth daily.      Marland Kitchen glimepiride (AMARYL) 4 MG tablet Take 4 mg by mouth 2 (two) times daily.      . hydrochlorothiazide (HYDRODIURIL) 25 MG tablet Take 1 tablet (25 mg total) by mouth daily.  30  tablet  30  . labetalol (NORMODYNE) 300 MG tablet Take 300 mg by mouth 2 (two) times daily.      . metFORMIN (GLUCOPHAGE) 500 MG tablet Take 2,000 mg by mouth daily with breakfast.      . predniSONE (DELTASONE) 20 MG tablet Take 3 tablets (60 mg total) by mouth daily.  15 tablet  0  . simvastatin (ZOCOR) 80 MG tablet Take 80 mg by mouth at bedtime.       No current facility-administered medications on file prior to visit.   Review of Systems  Constitutional: Negative for unexpected weight change, or unusual diaphoresis  HENT: Negative for tinnitus.   Eyes: Negative for photophobia and visual disturbance.  Respiratory: Negative for choking and stridor.    Gastrointestinal: Negative for vomiting and blood in stool.  Genitourinary: Negative for hematuria and decreased urine volume.  Musculoskeletal: Negative for acute joint swelling Skin: Negative for color change and wound.  Neurological: Negative for tremors and numbness other than noted  Psychiatric/Behavioral: Negative for decreased concentration or  hyperactivity.       Objective:   Physical Exam BP 130/68  Pulse 92  Temp(Src) 97.5 F (36.4 C) (Oral)  Ht 5\' 11"  (1.803 m)  Wt 305 lb 4 oz (138.46 kg)  BMI 42.59 kg/m2  SpO2 93% VS noted, morid obese Constitutional: Pt appears well-developed and well-nourished.  HENT: Head: NCAT.  Right Ear: External ear normal.  Left Ear: External ear normal.  Eyes: Conjunctivae and EOM are normal. Pupils are equal, round, and reactive to light.  Neck: Normal range of motion. Neck supple.  Cardiovascular: Normal rate and regular rhythm.   Pulmonary/Chest: Effort normal and breath sounds mild decreased, no wheezing  Abd:  Soft, NT, non-distended, + BS Neurological: Pt is alert. Not confused  Skin: Skin is warm. No erythema.  Psychiatric: Pt behavior is normal. Thought content normal.     Assessment & Plan:

## 2012-12-29 NOTE — Assessment & Plan Note (Signed)
stable overall by history and exam, recent data reviewed with pt, and pt to continue medical treatment as before,  to f/u any worsening symptoms or concerns, for labs today 

## 2012-12-29 NOTE — Assessment & Plan Note (Signed)
stable overall by history and exam, recent data reviewed with pt, and pt to continue medical treatment as before,  to f/u any worsening symptoms or concerns Lab Results  Component Value Date   LDLCALC 59 12/26/2012    

## 2012-12-29 NOTE — Assessment & Plan Note (Signed)
Ok to hold on further tx this time, off ACE due to allergic episode, consider start ARB

## 2012-12-30 ENCOUNTER — Ambulatory Visit (HOSPITAL_COMMUNITY): Payer: Medicare Other | Attending: Cardiology | Admitting: Radiology

## 2012-12-30 ENCOUNTER — Telehealth: Payer: Self-pay

## 2012-12-30 ENCOUNTER — Encounter: Payer: Self-pay | Admitting: Internal Medicine

## 2012-12-30 DIAGNOSIS — R0609 Other forms of dyspnea: Secondary | ICD-10-CM

## 2012-12-30 DIAGNOSIS — E119 Type 2 diabetes mellitus without complications: Secondary | ICD-10-CM | POA: Insufficient documentation

## 2012-12-30 DIAGNOSIS — I10 Essential (primary) hypertension: Secondary | ICD-10-CM | POA: Insufficient documentation

## 2012-12-30 DIAGNOSIS — F17200 Nicotine dependence, unspecified, uncomplicated: Secondary | ICD-10-CM | POA: Insufficient documentation

## 2012-12-30 DIAGNOSIS — R0600 Dyspnea, unspecified: Secondary | ICD-10-CM

## 2012-12-30 DIAGNOSIS — R0989 Other specified symptoms and signs involving the circulatory and respiratory systems: Secondary | ICD-10-CM | POA: Insufficient documentation

## 2012-12-30 DIAGNOSIS — G4733 Obstructive sleep apnea (adult) (pediatric): Secondary | ICD-10-CM | POA: Insufficient documentation

## 2012-12-30 DIAGNOSIS — Z8673 Personal history of transient ischemic attack (TIA), and cerebral infarction without residual deficits: Secondary | ICD-10-CM | POA: Insufficient documentation

## 2012-12-30 DIAGNOSIS — E785 Hyperlipidemia, unspecified: Secondary | ICD-10-CM | POA: Insufficient documentation

## 2012-12-30 DIAGNOSIS — E669 Obesity, unspecified: Secondary | ICD-10-CM | POA: Insufficient documentation

## 2012-12-30 MED ORDER — MOMETASONE FURO-FORMOTEROL FUM 200-5 MCG/ACT IN AERO: 2.0000 | INHALATION_SPRAY | Freq: Two times a day (BID) | RESPIRATORY_TRACT | Status: DC

## 2012-12-30 NOTE — Telephone Encounter (Signed)
Pt's spouse called stating Symbicort inhaler is too expensive. Pt is requesting cheaper alternative, please advise.

## 2012-12-30 NOTE — Telephone Encounter (Signed)
Ok to change to Prosser - done erx

## 2012-12-30 NOTE — Progress Notes (Signed)
Echocardiogram performed.  

## 2012-12-30 NOTE — Telephone Encounter (Signed)
Patient informed. 

## 2013-01-03 ENCOUNTER — Telehealth: Payer: Self-pay

## 2013-01-03 NOTE — Telephone Encounter (Signed)
All I can suggest is to check with pharmacist regarding a similar medication as tradjenta that would be less expensive

## 2013-01-03 NOTE — Telephone Encounter (Signed)
Very sorry,but all the inhalers are brand name; pt can ask pharmacist if there is a less expensive alternative

## 2013-01-03 NOTE — Telephone Encounter (Signed)
Phone call to pt to let him know he can ask pharmacist about a less expensive alternative. He states he can not afford his diabetic medicine either

## 2013-01-03 NOTE — Telephone Encounter (Signed)
LM for pt to check with pharmacist and let us know of what would be less expensive for him

## 2013-01-03 NOTE — Telephone Encounter (Signed)
Pt calls stating there was another inhaler sent to his pharmacy since the first one was $100. He states the second one sent in is only $4 less and he still can not afford this. Please advise.

## 2013-01-21 ENCOUNTER — Ambulatory Visit (INDEPENDENT_AMBULATORY_CARE_PROVIDER_SITE_OTHER): Payer: Medicare Other | Admitting: Internal Medicine

## 2013-01-21 DIAGNOSIS — R0609 Other forms of dyspnea: Secondary | ICD-10-CM

## 2013-01-21 DIAGNOSIS — R0600 Dyspnea, unspecified: Secondary | ICD-10-CM

## 2013-01-21 NOTE — Progress Notes (Signed)
PFT done today. 

## 2013-01-27 ENCOUNTER — Ambulatory Visit (INDEPENDENT_AMBULATORY_CARE_PROVIDER_SITE_OTHER): Payer: Medicare Other | Admitting: Internal Medicine

## 2013-01-27 ENCOUNTER — Encounter: Payer: Self-pay | Admitting: Internal Medicine

## 2013-01-27 VITALS — BP 120/62 | HR 90 | Temp 97.0°F | Ht 71.0 in | Wt 303.1 lb

## 2013-01-27 DIAGNOSIS — E785 Hyperlipidemia, unspecified: Secondary | ICD-10-CM

## 2013-01-27 DIAGNOSIS — J449 Chronic obstructive pulmonary disease, unspecified: Secondary | ICD-10-CM

## 2013-01-27 DIAGNOSIS — E119 Type 2 diabetes mellitus without complications: Secondary | ICD-10-CM

## 2013-01-27 DIAGNOSIS — I10 Essential (primary) hypertension: Secondary | ICD-10-CM

## 2013-01-27 MED ORDER — IRBESARTAN 300 MG PO TABS: 300.0000 mg | ORAL_TABLET | Freq: Every day | ORAL | Status: DC

## 2013-01-27 MED ORDER — FLUTICASONE-SALMETEROL 250-50 MCG/DOSE IN AEPB: 1.0000 | INHALATION_SPRAY | Freq: Two times a day (BID) | RESPIRATORY_TRACT | Status: DC

## 2013-01-27 NOTE — Assessment & Plan Note (Signed)
Ok to change the dulera to advair 250/50 bid with instructions on use today due to cost, had recent PFT's, to fu pulm as planned

## 2013-01-27 NOTE — Assessment & Plan Note (Signed)
D/w pt, tolerating the tradjenta, for fu labs next visit, cont wt loss, diet efforts

## 2013-01-27 NOTE — Progress Notes (Signed)
Subjective:    Patient ID: Eric [name], male    DOB: September 26, 1950, 62 y.o.   MRN: [id number]  HPI  Here to f/u., Here to f/u; overall doing ok,  Pt denies chest pain, increased sob or doe, wheezing, orthopnea, PND, increased LE swelling, palpitations, dizziness or syncope.  Pt denies polydipsia, polyuria, or low sugar symptoms such as weakness or confusion improved with po intake.  Pt denies new neurological symptoms such as new headache, or facial or extremity weakness or numbness.   Pt states overall good compliance with meds, has been trying to follow lower cholesterol, diabetic diet, with wt down from 316 to 303.  He laments the number of meds, and also dulera too expensive.  Is not currently on ARB or ACE now. Past Medical History  Diagnosis Date  . ALLERGIC RHINITIS 06/24/2007  . 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 ANXIETY 06/24/2007  . CELLULITIS AND ABSCESS OF LEG EXCEPT FOOT 03/28/2010  . CEREBROVASCULAR ACCIDENT, HX OF 06/24/2007  . COLONIC POLYPS, HX OF 06/24/2007  . COMMON MIGRAINE 06/24/2007  . CONSTIPATION, CHRONIC, HX OF 06/24/2007  . DEPRESSION 06/24/2007  . DIABETES MELLITUS, TYPE II 07/21/2008  . DIVERTICULOSIS, COLON 06/24/2007  . HYPERLIPIDEMIA 06/24/2007  . HYPERTENSION 06/24/2007  . INSOMNIA-SLEEP DISORDER-UNSPEC 01/24/2010  . OBSTRUCTIVE SLEEP APNEA 06/24/2007  . OTHER POSTSURGICAL STATUS OTHER 06/24/2007  . SKIN LESION 01/24/2010  . Unspecified Peripheral Vascular Disease 06/24/2007  . Claudication 06/02/2011  . Coronary artery disease    Past Surgical History  Procedure Laterality Date  . C spine fusion after job related injury  1985  . Bilat knee surgury  1990  . Hand surgury  1999  . Lumbar laminectomy      reports that he has been smoking.  He does not have any smokeless tobacco history on file. He reports that he does not drink alcohol or use illicit drugs. family history includes COPD in his father; Cancer in his mother; and Heart disease in his maternal grandfather and maternal  grandmother. Allergies  Allergen Reactions  . Oxycodone Other (See Comments)    Sedation/somnolence   Current Outpatient Prescriptions on File Prior to Visit  Medication Sig Dispense Refill  . albuterol (PROVENTIL HFA;VENTOLIN HFA) 108 (90 BASE) MCG/ACT inhaler Inhale 2 puffs into the lungs every 6 (six) hours as needed for wheezing.  1 Inhaler  5  . amLODipine (NORVASC) 10 MG tablet Take 10 mg by mouth daily.      Marland Kitchen aspirin 81 MG tablet Take 81 mg by mouth daily.        . clonazePAM (KLONOPIN) 0.5 MG tablet Take 0.5-1 mg by mouth at bedtime. Pt can take up to 2 depending on anxiety level      . diphenhydrAMINE (BENADRYL) 25 MG tablet Take 1 tablet (25 mg total) by mouth every 6 (six) hours.  20 tablet  0  . famotidine (PEPCID) 20 MG tablet Take 1 tablet (20 mg total) by mouth 2 (two) times daily.  30 tablet  0  . fenofibrate 160 MG tablet Take 160 mg by mouth daily.      Marland Kitchen glimepiride (AMARYL) 4 MG tablet Take 4 mg by mouth 2 (two) times daily.      . hydrochlorothiazide (HYDRODIURIL) 25 MG tablet Take 1 tablet (25 mg total) by mouth daily.  30 tablet  30  . labetalol (NORMODYNE) 300 MG tablet Take 300 mg by mouth 2 (two) times daily.      Marland Kitchen linagliptin (TRADJENTA) 5 MG TABS  tablet Take 1 tablet (5 mg total) by mouth daily.  90 tablet  3  . metFORMIN (GLUCOPHAGE) 500 MG tablet Take 2,000 mg by mouth daily with breakfast.      . mometasone-formoterol (DULERA) 200-5 MCG/ACT AERO Inhale 2 puffs into the lungs 2 (two) times daily.  1 Inhaler  11  . simvastatin (ZOCOR) 80 MG tablet Take 80 mg by mouth at bedtime.       No current facility-administered medications on file prior to visit.   Review of Systems  Constitutional: Negative for unexpected weight change, or unusual diaphoresis  HENT: Negative for tinnitus.   Eyes: Negative for photophobia and visual disturbance.  Respiratory: Negative for choking and stridor.   Gastrointestinal: Negative for vomiting and blood in stool.   Genitourinary: Negative for hematuria and decreased urine volume.  Musculoskeletal: Negative for acute joint swelling Skin: Negative for color change and wound.  Neurological: Negative for tremors and numbness other than noted  Psychiatric/Behavioral: Negative for decreased concentration or  hyperactivity.       Objective:   Physical Exam BP 120/62  Pulse 90  Temp(Src) 97 F (36.1 C) (Oral)  Ht 5\' 11"  (1.803 m)  Wt 303 lb 2 oz (137.497 kg)  BMI 42.3 kg/m2  SpO2 95% VS noted,  Constitutional: Pt appears well-developed and well-nourished.  HENT: Head: NCAT.  Right Ear: External ear normal.  Left Ear: External ear normal.  Eyes: Conjunctivae and EOM are normal. Pupils are equal, round, and reactive to light.  Neck: Normal range of motion. Neck supple.  Cardiovascular: Normal rate and regular rhythm.   Pulmonary/Chest: Effort normal and breath sounds decreased, no rales  Neurological: Pt is alert. Not confused  Skin: Skin is warm. No erythema.  Psychiatric: Pt behavior is normal. Thought content normal.     Assessment & Plan:

## 2013-01-27 NOTE — Assessment & Plan Note (Signed)
stable overall by history and exam, recent data reviewed with pt, and pt to continue medical treatment as before,  to f/u any worsening symptoms or concerns Lab Results  Component Value Date   LDLCALC 59 12/26/2012

## 2013-01-27 NOTE — Patient Instructions (Addendum)
OK to finish the amlodipine 10 mg per day until the bottle is finished THEN; start the generic for Avapro 300 mg per day (dont worry, the 300 mg works about the same as the 10 mg of the amlodipine) OK to stop the [facility] when you are out, then start the Advair 250/50 twice per day Please keep your appointments with your specialists as you have planned - pulmonary Please continue all other medications as before, and refills have been done if requested. Please have the pharmacy call with any other refills you may need. Please thank your wife with helping keep track of your medications

## 2013-01-27 NOTE — Assessment & Plan Note (Signed)
Stable overall, but ok to change norvasc to avapro generic 300 qd,  to f/u any worsening symptoms or concerns

## 2013-01-29 ENCOUNTER — Telehealth: Payer: Self-pay

## 2013-01-29 ENCOUNTER — Encounter: Payer: Self-pay | Admitting: Internal Medicine

## 2013-01-29 NOTE — Telephone Encounter (Signed)
Patient wife called starting that copay for advair is 45$ and is too expensive. She advised that she will call insurance to see what is covered. Samples available upfront for pt to pick up.

## 2013-03-04 ENCOUNTER — Other Ambulatory Visit: Payer: Self-pay | Admitting: Internal Medicine

## 2013-05-29 ENCOUNTER — Ambulatory Visit (INDEPENDENT_AMBULATORY_CARE_PROVIDER_SITE_OTHER): Payer: Medicare Other | Admitting: Internal Medicine

## 2013-05-29 ENCOUNTER — Telehealth: Payer: Self-pay | Admitting: Internal Medicine

## 2013-05-29 ENCOUNTER — Encounter: Payer: Self-pay | Admitting: Internal Medicine

## 2013-05-29 ENCOUNTER — Other Ambulatory Visit (INDEPENDENT_AMBULATORY_CARE_PROVIDER_SITE_OTHER): Payer: Medicare Other

## 2013-05-29 VITALS — BP 122/70 | HR 90 | Temp 97.1°F | Ht 71.0 in | Wt 303.0 lb

## 2013-05-29 DIAGNOSIS — Z0000 Encounter for general adult medical examination without abnormal findings: Secondary | ICD-10-CM

## 2013-05-29 DIAGNOSIS — E119 Type 2 diabetes mellitus without complications: Secondary | ICD-10-CM

## 2013-05-29 DIAGNOSIS — Z125 Encounter for screening for malignant neoplasm of prostate: Secondary | ICD-10-CM

## 2013-05-29 DIAGNOSIS — Z23 Encounter for immunization: Secondary | ICD-10-CM

## 2013-05-29 LAB — BASIC METABOLIC PANEL
CO2: 24 mEq/L (ref 19–32)
Calcium: 9.5 mg/dL (ref 8.4–10.5)
Creatinine, Ser: 1.1 mg/dL (ref 0.4–1.5)
Sodium: 135 mEq/L (ref 135–145)

## 2013-05-29 LAB — LIPID PANEL
Cholesterol: 156 mg/dL (ref 0–200)
Total CHOL/HDL Ratio: 6
Triglycerides: 479 mg/dL — ABNORMAL HIGH (ref 0.0–149.0)

## 2013-05-29 LAB — HEPATIC FUNCTION PANEL
Alkaline Phosphatase: 42 U/L (ref 39–117)
Bilirubin, Direct: 0.1 mg/dL (ref 0.0–0.3)
Total Bilirubin: 0.7 mg/dL (ref 0.3–1.2)

## 2013-05-29 LAB — CBC WITH DIFFERENTIAL/PLATELET
Basophils Absolute: 0 10*3/uL (ref 0.0–0.1)
Basophils Relative: 0.5 % (ref 0.0–3.0)
Eosinophils Absolute: 0.1 10*3/uL (ref 0.0–0.7)
HCT: 44.2 % (ref 39.0–52.0)
Hemoglobin: 15.3 g/dL (ref 13.0–17.0)
Lymphs Abs: 1.9 10*3/uL (ref 0.7–4.0)
MCHC: 34.7 g/dL (ref 30.0–36.0)
MCV: 98.1 fl (ref 78.0–100.0)
Monocytes Absolute: 0.4 10*3/uL (ref 0.1–1.0)
Neutro Abs: 4.4 10*3/uL (ref 1.4–7.7)
RBC: 4.5 Mil/uL (ref 4.22–5.81)
RDW: 13.6 % (ref 11.5–14.6)

## 2013-05-29 LAB — URINALYSIS, ROUTINE W REFLEX MICROSCOPIC
Leukocytes, UA: NEGATIVE
Nitrite: NEGATIVE
RBC / HPF: NONE SEEN (ref 0–?)
Specific Gravity, Urine: 1.01 (ref 1.000–1.030)
Urobilinogen, UA: 0.2 (ref 0.0–1.0)
WBC, UA: NONE SEEN (ref 0–?)

## 2013-05-29 LAB — LDL CHOLESTEROL, DIRECT: Direct LDL: 76.1 mg/dL

## 2013-05-29 LAB — PSA: PSA: 0.19 ng/mL (ref 0.10–4.00)

## 2013-05-29 LAB — MICROALBUMIN / CREATININE URINE RATIO
Microalb Creat Ratio: 1.5 mg/g (ref 0.0–30.0)
Microalb, Ur: 0.7 mg/dL (ref 0.0–1.9)

## 2013-05-29 LAB — HEMOGLOBIN A1C: Hgb A1c MFr Bld: 9.7 % — ABNORMAL HIGH (ref 4.6–6.5)

## 2013-05-29 MED ORDER — PIOGLITAZONE HCL 30 MG PO TABS: 30.0000 mg | ORAL_TABLET | Freq: Every day | ORAL | Status: DC

## 2013-05-29 NOTE — Patient Instructions (Addendum)
You had the flu shot today, and plesae make Nurse Visit appt in 2 wks for the Prevnar pneumonia shot Please continue all other medications as before, and refills have been done if requested. Please have the pharmacy call with any other refills you may need. Please continue your efforts at being more active, low cholesterol diet, and weight control. You are otherwise up to date with prevention measures today. Please keep your appointments with your specialists as you may have planned Please go to the LAB in the Basement (turn left off the elevator) for the tests to be done today You will be contacted by phone if any changes need to be made immediately.  Otherwise, you will receive a letter about your results with an explanation, but please check with MyChart first.  Please remember to sign up for My Chart if you have not done so, as this will be important to you in the future with finding out test results, communicating by private email, and scheduling acute appointments online when needed.  Please return in 6 months, or sooner if needed, with Lab testing done 3-5 days before

## 2013-05-29 NOTE — Assessment & Plan Note (Signed)
stable overall by history and exam, recent data reviewed with pt, and pt to continue medical treatment as before,  to f/u any worsening symptoms or concerns Lab Results  Component Value Date   HGBA1C 8.0* 12/26/2012   Due for f/u lab, delcines DM educatoin referral for now

## 2013-05-29 NOTE — Progress Notes (Signed)
Subjective:    Patient ID: Eric [name], male    DOB: 01-Oct-1950, 62 y.o.   MRN: [id number]  HPI  Here for wellness and f/u;  Overall doing ok;  Pt denies CP, worsening SOB, DOE, wheezing, orthopnea, PND, worsening LE edema, palpitations, dizziness or syncope.  Pt denies neurological change such as new headache, facial or extremity weakness.  Pt denies polydipsia, polyuria, or low sugar symptoms. Pt states overall good compliance with treatment and medications, good tolerability, and has been trying to follow lower cholesterol diet.  Pt denies worsening depressive symptoms, suicidal ideation or panic. No fever, night sweats, wt loss, loss of appetite, or other constitutional symptoms.  Pt states good ability with ADL's, has low fall risk, home safety reviewed and adequate, no other significant changes in hearing or vision, and only occasionally active with exercise.  Not using the inhlaler b/c too expensive, and makes too much money for medicaid or pt assist programs.  PFT's normal June 2014.  Has not seen Dr Maple Hudson in many yrs. Has lost a few lbs, goes to gym 3 times per wk Past Medical History  Diagnosis Date  . ALLERGIC RHINITIS 06/24/2007  . 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 ANXIETY 06/24/2007  . CELLULITIS AND ABSCESS OF LEG EXCEPT FOOT 03/28/2010  . CEREBROVASCULAR ACCIDENT, HX OF 06/24/2007  . COLONIC POLYPS, HX OF 06/24/2007  . COMMON MIGRAINE 06/24/2007  . CONSTIPATION, CHRONIC, HX OF 06/24/2007  . DEPRESSION 06/24/2007  . DIABETES MELLITUS, TYPE II 07/21/2008  . DIVERTICULOSIS, COLON 06/24/2007  . HYPERLIPIDEMIA 06/24/2007  . HYPERTENSION 06/24/2007  . INSOMNIA-SLEEP DISORDER-UNSPEC 01/24/2010  . OBSTRUCTIVE SLEEP APNEA 06/24/2007  . OTHER POSTSURGICAL STATUS OTHER 06/24/2007  . SKIN LESION 01/24/2010  . Unspecified Peripheral Vascular Disease 06/24/2007  . Claudication 06/02/2011  . Coronary artery disease    Past Surgical History  Procedure Laterality Date  . C spine fusion after job related injury   1985  . Bilat knee surgury  1990  . Hand surgury  1999  . Lumbar laminectomy      reports that he has been smoking.  He does not have any smokeless tobacco history on file. He reports that he does not drink alcohol or use illicit drugs. family history includes COPD in his father; Cancer in his mother; Heart disease in his maternal grandfather and maternal grandmother. Allergies  Allergen Reactions  . Oxycodone Other (See Comments)    Sedation/somnolence   Current Outpatient Prescriptions on File Prior to Visit  Medication Sig Dispense Refill  . albuterol (PROVENTIL HFA;VENTOLIN HFA) 108 (90 BASE) MCG/ACT inhaler Inhale 2 puffs into the lungs every 6 (six) hours as needed for wheezing.  1 Inhaler  5  . aspirin 81 MG tablet Take 81 mg by mouth daily.        . clonazePAM (KLONOPIN) 0.5 MG tablet Take 0.5-1 mg by mouth at bedtime. Pt can take up to 2 depending on anxiety level      . diphenhydrAMINE (BENADRYL) 25 MG tablet Take 1 tablet (25 mg total) by mouth every 6 (six) hours.  20 tablet  0  . famotidine (PEPCID) 20 MG tablet Take 1 tablet (20 mg total) by mouth 2 (two) times daily.  30 tablet  0  . fenofibrate 160 MG tablet Take 160 mg by mouth daily.      . Fluticasone-Salmeterol (ADVAIR DISKUS) 250-50 MCG/DOSE AEPB Inhale 1 puff into the lungs 2 (two) times daily.  1 each  11  . glimepiride (AMARYL) 4 MG  tablet Take 4 mg by mouth 2 (two) times daily.      . hydrochlorothiazide (HYDRODIURIL) 25 MG tablet Take 1 tablet (25 mg total) by mouth daily.  30 tablet  30  . irbesartan (AVAPRO) 300 MG tablet Take 1 tablet (300 mg total) by mouth daily.  90 tablet  3  . labetalol (NORMODYNE) 300 MG tablet Take 300 mg by mouth 2 (two) times daily.      Marland Kitchen linagliptin (TRADJENTA) 5 MG TABS tablet Take 1 tablet (5 mg total) by mouth daily.  90 tablet  3  . metFORMIN (GLUCOPHAGE) 500 MG tablet Take 2,000 mg by mouth daily with breakfast.      . simvastatin (ZOCOR) 80 MG tablet Take 80 mg by mouth at  bedtime.      . simvastatin (ZOCOR) 80 MG tablet TAKE ONE TABLET BY MOUTH ONCE DAILY AT BEDTIME  90 tablet  3   No current facility-administered medications on file prior to visit.   Review of Systems Constitutional: Negative for diaphoresis, activity change, appetite change or unexpected weight change.  HENT: Negative for hearing loss, ear pain, facial swelling, mouth sores and neck stiffness.   Eyes: Negative for pain, redness and visual disturbance.  Respiratory: Negative for shortness of breath and wheezing.   Cardiovascular: Negative for chest pain and palpitations.  Gastrointestinal: Negative for diarrhea, blood in stool, abdominal distention or other pain Genitourinary: Negative for hematuria, flank pain or change in urine volume.  Musculoskeletal: Negative for myalgias and joint swelling.  Skin: Negative for color change and wound.  Neurological: Negative for syncope and numbness. other than noted Hematological: Negative for adenopathy.  Psychiatric/Behavioral: Negative for hallucinations, self-injury, decreased concentration and agitation.      Objective:   Physical Exam BP 122/70  Pulse 90  Temp(Src) 97.1 F (36.2 C) (Oral)  Ht 5\' 11"  (1.803 m)  Wt 303 lb (137.44 kg)  BMI 42.28 kg/m2  SpO2 94% VS noted,  Constitutional: Pt is oriented to person, place, and time. Appears well-developed and well-nourished.  Head: Normocephalic and atraumatic.  Right Ear: External ear normal.  Left Ear: External ear normal.  Nose: Nose normal.  Mouth/Throat: Oropharynx is clear and moist.  Eyes: Conjunctivae and EOM are normal. Pupils are equal, round, and reactive to light.  Neck: Normal range of motion. Neck supple. No JVD present. No tracheal deviation present.  Cardiovascular: Normal rate, regular rhythm, normal heart sounds and intact distal pulses.   Pulmonary/Chest: Effort normal and breath sounds normal.  Abdominal: Soft. Bowel sounds are normal. There is no tenderness. No HSM   Musculoskeletal: Normal range of motion. Exhibits no edema.  Lymphadenopathy:  Has no cervical adenopathy.  Neurological: Pt is alert and oriented to person, place, and time. Pt has normal reflexes. No cranial nerve deficit.  Skin: Skin is warm and dry. No rash noted.  Psychiatric:  Has  normal mood and affect. Behavior is normal. mild nervous    Assessment & Plan:

## 2013-05-29 NOTE — Telephone Encounter (Signed)
Message copied by Corwin Levins on Thu May 29, 2013  5:42 PM ------      Message from: Scharlene Gloss B      Created: Thu May 29, 2013  4:50 PM       Called the patient informed of results and he does not want to start insulin.  Please advise ------

## 2013-05-29 NOTE — Assessment & Plan Note (Signed)

## 2013-05-29 NOTE — Telephone Encounter (Signed)
Ok for  actos 30 mg per day  Cont ALL other meds

## 2013-05-29 NOTE — Telephone Encounter (Signed)
Called the patient informed of medication to start.

## 2013-05-30 ENCOUNTER — Telehealth: Payer: Self-pay | Admitting: *Deleted

## 2013-05-30 NOTE — Telephone Encounter (Signed)
Informed the patient. Also the patient had many questions concerning his diet.  MD offered to refer to diabetes education, but the patient stated it would not help.  He does have a book that belongs to his wife he will read.  Also I informed him I am going to get some information gathered for him and he can pickup at the front desk at his convenience.

## 2013-05-30 NOTE — Telephone Encounter (Signed)
The patient called my phone as well to inform Actos over $100 for #90 and $40 for one month.  He cannot afford and needs alternative please

## 2013-05-30 NOTE — Telephone Encounter (Signed)
i printed off the pt assist application - to [place] for help

## 2013-05-30 NOTE — Telephone Encounter (Signed)
Pt called states Pioglitazone medication is expensive.  Pt further states he was unaware he was taking medication for Diabetes or what he was taking any of his medications for.  Please advise

## 2013-06-05 ENCOUNTER — Ambulatory Visit: Payer: Medicare Other | Admitting: Internal Medicine

## 2013-06-06 ENCOUNTER — Other Ambulatory Visit: Payer: Self-pay | Admitting: Internal Medicine

## 2013-06-06 MED ORDER — PIOGLITAZONE HCL 30 MG PO TABS: 30.0000 mg | ORAL_TABLET | Freq: Every day | ORAL | Status: DC

## 2013-06-10 ENCOUNTER — Telehealth: Payer: Self-pay | Admitting: Internal Medicine

## 2013-06-10 NOTE — Telephone Encounter (Signed)
Called patient to obtain financial information for his patient assistance for Actos, patient is not sure he has the information they are requesting for the application and he is not sure he wants to go through with the PA  He is requesting a call back from Zella Ball because he has several questions about his medications

## 2013-06-10 NOTE — Telephone Encounter (Signed)
Called the patient and due to multiple questions/concerns to his diabetes informed to schedule office visit with Dr. Jonny Ruiz.  He agreed to do so and scheduled for October 30th.

## 2013-06-19 ENCOUNTER — Ambulatory Visit (INDEPENDENT_AMBULATORY_CARE_PROVIDER_SITE_OTHER): Payer: Medicare Other | Admitting: Internal Medicine

## 2013-06-19 ENCOUNTER — Encounter: Payer: Self-pay | Admitting: Internal Medicine

## 2013-06-19 VITALS — BP 140/72 | HR 86 | Temp 97.8°F | Ht 71.0 in | Wt 299.1 lb

## 2013-06-19 DIAGNOSIS — E785 Hyperlipidemia, unspecified: Secondary | ICD-10-CM

## 2013-06-19 DIAGNOSIS — I10 Essential (primary) hypertension: Secondary | ICD-10-CM

## 2013-06-19 DIAGNOSIS — Z23 Encounter for immunization: Secondary | ICD-10-CM

## 2013-06-19 DIAGNOSIS — E119 Type 2 diabetes mellitus without complications: Secondary | ICD-10-CM

## 2013-06-19 LAB — GLUCOSE, POCT (MANUAL RESULT ENTRY): POC Glucose: 161 mg/dl — AB (ref 70–99)

## 2013-06-19 MED ORDER — ONETOUCH ULTRA 2 W/DEVICE KIT: 1.0000 "application " | PACK | Freq: Every day | Status: AC

## 2013-06-19 MED ORDER — GLUCOSE BLOOD VI STRP: ORAL_STRIP | Status: DC

## 2013-06-19 NOTE — Progress Notes (Signed)
Subjective:    Patient ID: Eric [name], male    DOB: 1951-01-18, 62 y.o.   MRN: [id number]  HPI  Here to f/u,very very reluctant to consider insulin, admits now he has not taken his med as prescribed prob less than 50%, has been more diligent in last 3 days, feels improved.   Pt denies polydipsia, polyuria, or low sugar symptoms such as weakness or confusion improved with po intake. Now willing for DM educaiton and checking cbg's at home,  Pt states overall good compliance with meds recetnly, trying to follow lower cholesterol, diabetic diet, wt overall stable but little exercise however.    Pt denies chest pain, increased sob or doe, wheezing, orthopnea, PND, increased LE swelling, palpitations, dizziness or syncope.  Pt denies new neurological symptoms such as new headache, or facial or extremity weakness or numbness. Very concerned about med costs, which is 3 times the cost of wife. Past Medical History  Diagnosis Date  . ALLERGIC RHINITIS 06/24/2007  . 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 ANXIETY 06/24/2007  . CELLULITIS AND ABSCESS OF LEG EXCEPT FOOT 03/28/2010  . CEREBROVASCULAR ACCIDENT, HX OF 06/24/2007  . COLONIC POLYPS, HX OF 06/24/2007  . COMMON MIGRAINE 06/24/2007  . CONSTIPATION, CHRONIC, HX OF 06/24/2007  . DEPRESSION 06/24/2007  . DIABETES MELLITUS, TYPE II 07/21/2008  . DIVERTICULOSIS, COLON 06/24/2007  . HYPERLIPIDEMIA 06/24/2007  . HYPERTENSION 06/24/2007  . INSOMNIA-SLEEP DISORDER-UNSPEC 01/24/2010  . OBSTRUCTIVE SLEEP APNEA 06/24/2007  . OTHER POSTSURGICAL STATUS OTHER 06/24/2007  . SKIN LESION 01/24/2010  . Unspecified Peripheral Vascular Disease 06/24/2007  . Claudication 06/02/2011  . Coronary artery disease    Past Surgical History  Procedure Laterality Date  . C spine fusion after job related injury  1985  . Bilat knee surgury  1990  . Hand surgury  1999  . Lumbar laminectomy      reports that he has been smoking.  He does not have any smokeless tobacco history on file. He reports that  he does not drink alcohol or use illicit drugs. family history includes COPD in his father; Cancer in his mother; Heart disease in his maternal grandfather and maternal grandmother. Allergies  Allergen Reactions  . Oxycodone Other (See Comments)    Sedation/somnolence   Current Outpatient Prescriptions on File Prior to Visit  Medication Sig Dispense Refill  . albuterol (PROVENTIL HFA;VENTOLIN HFA) 108 (90 BASE) MCG/ACT inhaler Inhale 2 puffs into the lungs every 6 (six) hours as needed for wheezing.  1 Inhaler  5  . aspirin 81 MG tablet Take 81 mg by mouth daily.        . clonazePAM (KLONOPIN) 0.5 MG tablet Take 0.5-1 mg by mouth at bedtime. Pt can take up to 2 depending on anxiety level      . diphenhydrAMINE (BENADRYL) 25 MG tablet Take 1 tablet (25 mg total) by mouth every 6 (six) hours.  20 tablet  0  . famotidine (PEPCID) 20 MG tablet Take 1 tablet (20 mg total) by mouth 2 (two) times daily.  30 tablet  0  . fenofibrate 160 MG tablet Take 160 mg by mouth daily.      . Fluticasone-Salmeterol (ADVAIR DISKUS) 250-50 MCG/DOSE AEPB Inhale 1 puff into the lungs 2 (two) times daily.  1 each  11  . glimepiride (AMARYL) 4 MG tablet Take 4 mg by mouth 2 (two) times daily.      . hydrochlorothiazide (HYDRODIURIL) 25 MG tablet Take 1 tablet (25 mg total) by mouth daily.  30 tablet  30  . irbesartan (AVAPRO) 300 MG tablet Take 1 tablet (300 mg total) by mouth daily.  90 tablet  3  . labetalol (NORMODYNE) 300 MG tablet Take 300 mg by mouth 2 (two) times daily.      Marland Kitchen linagliptin (TRADJENTA) 5 MG TABS tablet Take 1 tablet (5 mg total) by mouth daily.  90 tablet  3  . metFORMIN (GLUCOPHAGE) 500 MG tablet Take 2,000 mg by mouth daily with breakfast.      . pioglitazone (ACTOS) 30 MG tablet Take 1 tablet (30 mg total) by mouth daily.  90 tablet  3  . simvastatin (ZOCOR) 80 MG tablet Take 80 mg by mouth at bedtime.       No current facility-administered medications on file prior to visit.   Review of  Systems  Constitutional: Negative for unexpected weight change, or unusual diaphoresis  HENT: Negative for tinnitus.   Eyes: Negative for photophobia and visual disturbance.  Respiratory: Negative for choking and stridor.   Gastrointestinal: Negative for vomiting and blood in stool.  Genitourinary: Negative for hematuria and decreased urine volume.  Musculoskeletal: Negative for acute joint swelling Skin: Negative for color change and wound.  Neurological: Negative for tremors and numbness other than noted  Psychiatric/Behavioral: Negative for decreased concentration or  hyperactivity.       Objective:   Physical Exam BP 140/72  Pulse 86  Temp(Src) 97.8 F (36.6 C) (Oral)  Ht 5\' 11"  (1.803 m)  Wt 299 lb 2 oz (135.682 kg)  BMI 41.74 kg/m2  SpO2 90% VS noted,  Constitutional: Pt appears well-developed and well-nourished. obese HENT: Head: NCAT.  Right Ear: External ear normal.  Left Ear: External ear normal.  Eyes: Conjunctivae and EOM are normal. Pupils are equal, round, and reactive to light.  Neck: Normal range of motion. Neck supple.  Cardiovascular: Normal rate and regular rhythm.   Pulmonary/Chest: Effort normal and breath sounds normal.  Abd:  Soft, NT, non-distended, + BS Neurological: Pt is alert. Not confused  Skin: Skin is warm. No erythema.  Psychiatric: Pt behavior is normal. Thought content normal.   CBC 161 in office today    Assessment & Plan:

## 2013-06-19 NOTE — Patient Instructions (Addendum)
Your blood sugar is 161 today Please check your sugar once per day, either in the AM before Breakfast, or in the PM before Dinner, please write them down and bring to your next visit (and Zella Ball has instructed you today on how to do this) Please call if you change your mind about the Diabetes Education class Please continue all other medications as before, including the generic Actos You should consider [facility], as your metformin and glimeparide I believe would be free there, as long as you purchase the other medications there as well You had the new Prevnar Pneumonia shot today  Please have the pharmacy call with any other refills you may need.  Please return in 3 months, or sooner if needed, with Lab testing done 3-5 days before

## 2013-06-19 NOTE — Assessment & Plan Note (Signed)
Pt now more motivated when confronted with the need for insulin, still declines DM education, but will start checking cbgs daily, and take all 3 pills (was actually not taking for the most part before, finances tight as well)

## 2013-06-22 NOTE — Assessment & Plan Note (Signed)
Lab Results  Component Value Date   LDLCALC 59 12/26/2012   stable overall by history and exam, recent data reviewed with pt, and pt to continue medical treatment as before,  to f/u any worsening symptoms or concerns

## 2013-06-22 NOTE — Assessment & Plan Note (Signed)
stable overall by history and exam, recent data reviewed with pt, and pt to continue medical treatment as before,  to f/u any worsening symptoms or concerns BP Readings from Last 3 Encounters:  06/19/13 140/72  05/29/13 122/70  01/27/13 120/62

## 2013-06-26 ENCOUNTER — Telehealth: Payer: Self-pay | Admitting: Internal Medicine

## 2013-06-26 MED ORDER — PIOGLITAZONE HCL 30 MG PO TABS: 30.0000 mg | ORAL_TABLET | Freq: Every day | ORAL | Status: DC

## 2013-06-26 NOTE — Telephone Encounter (Signed)
Done hardcopy to robin  

## 2013-06-26 NOTE — Telephone Encounter (Signed)
Eric [name] is no longer offering patient assistance for Actos, contacted [employer] and they offer the medication for $25 dollars. Patient has to send this application in . I have printed the application and it is at the front he just needs a script to send as well  Patient has been informed and will come by the office to pick up the application and script to send in, notify patient when script is ready

## 2013-06-26 NOTE — Telephone Encounter (Signed)
Patient called to inform script is ready for pickup at the front .

## 2013-07-01 ENCOUNTER — Telehealth: Payer: Self-pay | Admitting: Internal Medicine

## 2013-07-01 MED ORDER — METFORMIN HCL 1000 MG PO TABS: 1000.0000 mg | ORAL_TABLET | Freq: Two times a day (BID) | ORAL | Status: DC

## 2013-07-01 NOTE — Telephone Encounter (Signed)
Done erx 

## 2013-07-01 NOTE — Telephone Encounter (Signed)
Pt called request Metformin dosage to be change from 4 tab 500 mg a day to 2 tab of 1000 mg a day. Please cal pt. This is per insurance purposes.

## 2013-07-02 ENCOUNTER — Other Ambulatory Visit: Payer: Self-pay

## 2013-07-02 MED ORDER — GLIMEPIRIDE 4 MG PO TABS: 4.0000 mg | ORAL_TABLET | Freq: Two times a day (BID) | ORAL | Status: DC

## 2013-07-02 MED ORDER — METFORMIN HCL 1000 MG PO TABS: 1000.0000 mg | ORAL_TABLET | Freq: Two times a day (BID) | ORAL | Status: DC

## 2013-07-02 MED ORDER — FENOFIBRATE 160 MG PO TABS: 160.0000 mg | ORAL_TABLET | Freq: Every day | ORAL | Status: DC

## 2013-07-02 MED ORDER — LABETALOL HCL 300 MG PO TABS: 300.0000 mg | ORAL_TABLET | Freq: Two times a day (BID) | ORAL | Status: DC

## 2013-07-02 NOTE — Telephone Encounter (Signed)
Called the patient informed of rx sent in.  Also the patient has changed pharmacy's and did resend Metformin to [facility] on [address]

## 2013-09-12 ENCOUNTER — Other Ambulatory Visit: Payer: Self-pay | Admitting: *Deleted

## 2013-09-12 MED ORDER — ACCU-CHEK SOFT TOUCH LANCETS MISC: Status: DC

## 2013-09-12 MED ORDER — GLUCOSE BLOOD VI STRP: ORAL_STRIP | Status: DC

## 2013-09-12 MED ORDER — BLOOD GLUCOSE MONITORING SUPPL DEVI: 1.0000 | Freq: Every day | Status: DC

## 2013-09-23 ENCOUNTER — Ambulatory Visit: Payer: Medicare Other | Admitting: Internal Medicine

## 2013-09-25 ENCOUNTER — Telehealth: Payer: Self-pay | Admitting: *Deleted

## 2013-09-25 NOTE — Telephone Encounter (Signed)
Spoke with Praesel from [facility].  She states test strips, monitor kit, and lancets were rcvd on 1.23.15.  PA request is duplicate.

## 2013-09-30 ENCOUNTER — Ambulatory Visit: Payer: Medicare Other | Admitting: Internal Medicine

## 2013-10-01 ENCOUNTER — Other Ambulatory Visit: Payer: Self-pay

## 2013-10-01 DIAGNOSIS — R609 Edema, unspecified: Secondary | ICD-10-CM

## 2013-10-01 MED ORDER — HYDROCHLOROTHIAZIDE 25 MG PO TABS: 25.0000 mg | ORAL_TABLET | Freq: Every day | ORAL | Status: DC

## 2013-11-27 ENCOUNTER — Telehealth: Payer: Self-pay | Admitting: Internal Medicine

## 2013-11-27 ENCOUNTER — Other Ambulatory Visit (INDEPENDENT_AMBULATORY_CARE_PROVIDER_SITE_OTHER): Payer: Medicare Other

## 2013-11-27 ENCOUNTER — Encounter: Payer: Self-pay | Admitting: Internal Medicine

## 2013-11-27 ENCOUNTER — Ambulatory Visit (INDEPENDENT_AMBULATORY_CARE_PROVIDER_SITE_OTHER): Payer: Medicare Other | Admitting: Internal Medicine

## 2013-11-27 VITALS — BP 122/70 | HR 85 | Temp 97.2°F | Ht 73.0 in | Wt 306.2 lb

## 2013-11-27 DIAGNOSIS — I10 Essential (primary) hypertension: Secondary | ICD-10-CM

## 2013-11-27 DIAGNOSIS — E119 Type 2 diabetes mellitus without complications: Secondary | ICD-10-CM

## 2013-11-27 DIAGNOSIS — Z0000 Encounter for general adult medical examination without abnormal findings: Secondary | ICD-10-CM

## 2013-11-27 DIAGNOSIS — F3289 Other specified depressive episodes: Secondary | ICD-10-CM

## 2013-11-27 DIAGNOSIS — F329 Major depressive disorder, single episode, unspecified: Secondary | ICD-10-CM

## 2013-11-27 DIAGNOSIS — E785 Hyperlipidemia, unspecified: Secondary | ICD-10-CM

## 2013-11-27 LAB — LIPID PANEL
Cholesterol: 144 mg/dL (ref 0–200)
HDL: 31.7 mg/dL — ABNORMAL LOW (ref >39.00)
LDL CALC: 56 mg/dL (ref 0–99)
TRIGLYCERIDES: 284 mg/dL — AB (ref 0.0–149.0)
Total CHOL/HDL Ratio: 5
VLDL: 56.8 mg/dL — AB (ref 0.0–40.0)

## 2013-11-27 LAB — HEPATIC FUNCTION PANEL
ALT: 23 U/L (ref 0–53)
AST: 28 U/L (ref 0–37)
Albumin: 4.6 g/dL (ref 3.5–5.2)
Alkaline Phosphatase: 37 U/L — ABNORMAL LOW (ref 39–117)
BILIRUBIN DIRECT: 0.1 mg/dL (ref 0.0–0.3)
BILIRUBIN TOTAL: 0.5 mg/dL (ref 0.3–1.2)
Total Protein: 7.3 g/dL (ref 6.0–8.3)

## 2013-11-27 LAB — BASIC METABOLIC PANEL
BUN: 25 mg/dL — AB (ref 6–23)
CO2: 28 mEq/L (ref 19–32)
CREATININE: 1.3 mg/dL (ref 0.4–1.5)
Calcium: 10.2 mg/dL (ref 8.4–10.5)
Chloride: 103 mEq/L (ref 96–112)
GFR: 60.18 mL/min (ref >60.00)
Glucose, Bld: 68 mg/dL — ABNORMAL LOW (ref 70–99)
POTASSIUM: 4.6 meq/L (ref 3.5–5.1)
Sodium: 141 mEq/L (ref 135–145)

## 2013-11-27 LAB — HEMOGLOBIN A1C: Hgb A1c MFr Bld: 6 % (ref 4.6–6.5)

## 2013-11-27 MED ORDER — GLIMEPIRIDE 4 MG PO TABS: 4.0000 mg | ORAL_TABLET | Freq: Every day | ORAL | Status: DC

## 2013-11-27 NOTE — Patient Instructions (Addendum)
Ok to stop the afternoon dosing of the glimeparide (so you only would take the 4 mg pill in the am only)  Please continue all other medications as before, and refills have been done if requested. Please have the pharmacy call with any other refills you may need.  Please continue to monitor your blood sugars as you do, and call if you have further lower sugars  Please continue your efforts at being more active, low cholesterol diabetic diet, and weight control.  Please go to the LAB in the Basement (turn left off the elevator) for the tests to be done today You will be contacted by phone if any changes need to be made immediately.  Otherwise, you will receive a letter about your results with an explanation, but please check with MyChart first.  Please remember to sign up for MyChart if you have not done so, as this will be important to you in the future with finding out test results, communicating by private email, and scheduling acute appointments online when needed.  Please return in 6 months, or sooner if needed, with Lab testing done 3-5 days before

## 2013-11-27 NOTE — Progress Notes (Signed)
Subjective:    Patient ID: Eric [name], male    DOB: 08/06/1951, 63 y.o.   MRN: [id number]  HPI   Here to f/u; overall doing ok,  Pt denies chest pain, increased sob or doe, wheezing, orthopnea, PND, increased LE swelling, palpitations, dizziness or syncope.  Pt denies polydipsia, polyuria, or low sugar symptoms such as weakness or confusion improved with po intake.  Pt denies new neurological symptoms such as new headache, or facial or extremity weakness or numbness.   Pt has had ? compliance with meds, seems somewhat confused about what he takes, has not really been trying to follow lower cholesterol, diabetic diet,  and little exercise however. Younger brother died 03/20/2015with MI, pt now grieving. Has been paying less attention to his health.  Not been checking his sugars except for several low AM sugars on current meds.  Past Medical History  Diagnosis Date  . ALLERGIC RHINITIS 06/24/2007  . 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 ANXIETY 06/24/2007  . CELLULITIS AND ABSCESS OF LEG EXCEPT FOOT 03/28/2010  . CEREBROVASCULAR ACCIDENT, HX OF 06/24/2007  . COLONIC POLYPS, HX OF 06/24/2007  . COMMON MIGRAINE 06/24/2007  . CONSTIPATION, CHRONIC, HX OF 06/24/2007  . DEPRESSION 06/24/2007  . DIABETES MELLITUS, TYPE II 07/21/2008  . DIVERTICULOSIS, COLON 06/24/2007  . HYPERLIPIDEMIA 06/24/2007  . HYPERTENSION 06/24/2007  . INSOMNIA-SLEEP DISORDER-UNSPEC 01/24/2010  . OBSTRUCTIVE SLEEP APNEA 06/24/2007  . OTHER POSTSURGICAL STATUS OTHER 06/24/2007  . SKIN LESION 01/24/2010  . Unspecified Peripheral Vascular Disease 06/24/2007  . Claudication 06/02/2011  . Coronary artery disease    Past Surgical History  Procedure Laterality Date  . C spine fusion after job related injury  1985  . Bilat knee surgury  1990  . Hand surgury  1999  . Lumbar laminectomy      reports that he has been smoking.  He does not have any smokeless tobacco history on file. He reports that he does not drink alcohol or use illicit drugs. family  history includes COPD in his father; Cancer in his mother; Heart disease in his maternal grandfather and maternal grandmother. Allergies  Allergen Reactions  . Oxycodone Other (See Comments)    Sedation/somnolence   Current Outpatient Prescriptions on File Prior to Visit  Medication Sig Dispense Refill  . albuterol (PROVENTIL HFA;VENTOLIN HFA) 108 (90 BASE) MCG/ACT inhaler Inhale 2 puffs into the lungs every 6 (six) hours as needed for wheezing.  1 Inhaler  5  . aspirin 81 MG tablet Take 81 mg by mouth daily.        . Blood Glucose Monitoring Suppl (ONE TOUCH ULTRA 2) W/DEVICE KIT 1 application by Does not apply route daily.  1 each  0  . Blood Glucose Monitoring Suppl DEVI 1 Device by Does not apply route daily.  1 each  0  . clonazePAM (KLONOPIN) 0.5 MG tablet Take 0.5-1 mg by mouth at bedtime. Pt can take up to 2 depending on anxiety level      . diphenhydrAMINE (BENADRYL) 25 MG tablet Take 1 tablet (25 mg total) by mouth every 6 (six) hours.  20 tablet  0  . famotidine (PEPCID) 20 MG tablet Take 1 tablet (20 mg total) by mouth 2 (two) times daily.  30 tablet  0  . fenofibrate 160 MG tablet Take 1 tablet (160 mg total) by mouth daily.  90 tablet  11  . Fluticasone-Salmeterol (ADVAIR DISKUS) 250-50 MCG/DOSE AEPB Inhale 1 puff into the lungs 2 (two) times daily.  1 each  11  . glucose blood test strip Use as instructed  100 each  12  . glucose blood test strip Use as instructed  100 each  12  . hydrochlorothiazide (HYDRODIURIL) 25 MG tablet Take 1 tablet (25 mg total) by mouth daily.  30 tablet  11  . irbesartan (AVAPRO) 300 MG tablet Take 1 tablet (300 mg total) by mouth daily.  90 tablet  3  . labetalol (NORMODYNE) 300 MG tablet Take 1 tablet (300 mg total) by mouth 2 (two) times daily.  180 tablet  3  . Lancets (ACCU-CHEK SOFT TOUCH) lancets Use as instructed  100 each  12  . linagliptin (TRADJENTA) 5 MG TABS tablet Take 1 tablet (5 mg total) by mouth daily.  90 tablet  3  . metFORMIN  (GLUCOPHAGE) 1000 MG tablet Take 1 tablet (1,000 mg total) by mouth 2 (two) times daily with a meal.  180 tablet  3  . pioglitazone (ACTOS) 30 MG tablet Take 1 tablet (30 mg total) by mouth daily.  90 tablet  3  . simvastatin (ZOCOR) 80 MG tablet Take 80 mg by mouth at bedtime.       No current facility-administered medications on file prior to visit.    Review of Systems  Constitutional: Negative for unexpected weight change, or unusual diaphoresis  HENT: Negative for tinnitus.   Eyes: Negative for photophobia and visual disturbance.  Respiratory: Negative for choking and stridor.   Gastrointestinal: Negative for vomiting and blood in stool.  Genitourinary: Negative for hematuria and decreased urine volume.  Musculoskeletal: Negative for acute joint swelling Skin: Negative for color change and wound.  Neurological: Negative for tremors and numbness other than noted  Psychiatric/Behavioral: Negative for decreased concentration or  hyperactivity.       Objective:   Physical Exam BP 122/70  Pulse 85  Temp(Src) 97.2 F (36.2 C) (Oral)  Ht 6' 1"  (1.854 m)  Wt 306 lb 4 oz (138.914 kg)  BMI 40.41 kg/m2  SpO2 95% VS noted,  Constitutional: Pt appears well-developed and well-nourished.  HENT: Head: NCAT.  Right Ear: External ear normal.  Left Ear: External ear normal.  Eyes: Conjunctivae and EOM are normal. Pupils are equal, round, and reactive to light.  Neck: Normal range of motion. Neck supple.  Cardiovascular: Normal rate and regular rhythm.   Pulmonary/Chest: Effort normal and breath sounds normal.  Abd:  Soft, NT, non-distended, + BS Neurological: Pt is alert. Not confused  Skin: Skin is warm. No erythema.  Psychiatric: Pt behavior is normal. Thought content normal. sad today    Assessment & Plan:

## 2013-11-27 NOTE — Progress Notes (Signed)
Pre visit review using our clinic review tool, if applicable. No additional management support is needed unless otherwise documented below in the visit note. 

## 2013-11-27 NOTE — Telephone Encounter (Signed)
Relevant patient education assigned to patient using Emmi. ° °

## 2013-11-30 NOTE — Assessment & Plan Note (Signed)
stable overall by history and exam, recent data reviewed with pt, and pt to continue medical treatment as before,  to f/u any worsening symptoms or concerns Lab Results  Component Value Date   LDLCALC 56 11/27/2013

## 2013-11-30 NOTE — Assessment & Plan Note (Signed)
Overcontrolled, to stop the PM dosing of his sulfonyurea, o/w stable overall by history and exam, recent data reviewed with pt, and pt to continue medical treatment as before,  to f/u any worsening symptoms or concerns Lab Results  Component Value Date   HGBA1C 6.0 11/27/2013

## 2013-11-30 NOTE — Assessment & Plan Note (Signed)
stable overall by history and exam, recent data reviewed with pt, and pt to continue medical treatment as before,  to f/u any worsening symptoms or concerns BP Readings from Last 3 Encounters:  11/27/13 122/70  06/19/13 140/72  05/29/13 122/70

## 2013-11-30 NOTE — Assessment & Plan Note (Signed)
Grieving in recent wks, but denies other depression, suicidal ideation, cont same tx, declines referral for counseling

## 2014-01-09 ENCOUNTER — Other Ambulatory Visit: Payer: Self-pay

## 2014-01-09 MED ORDER — SIMVASTATIN 80 MG PO TABS: 80.0000 mg | ORAL_TABLET | Freq: Every day | ORAL | Status: DC

## 2014-02-17 ENCOUNTER — Other Ambulatory Visit: Payer: Self-pay

## 2014-02-17 MED ORDER — IRBESARTAN 300 MG PO TABS: 300.0000 mg | ORAL_TABLET | Freq: Every day | ORAL | Status: DC

## 2014-05-25 ENCOUNTER — Other Ambulatory Visit: Payer: Self-pay

## 2014-05-25 MED ORDER — PIOGLITAZONE HCL 30 MG PO TABS
30.0000 mg | ORAL_TABLET | Freq: Every day | ORAL | Status: DC
Start: 2014-05-25 — End: 2014-05-29

## 2014-05-25 MED ORDER — METFORMIN HCL 1000 MG PO TABS: 1000.0000 mg | ORAL_TABLET | Freq: Two times a day (BID) | ORAL | Status: DC

## 2014-05-25 MED ORDER — FENOFIBRATE 160 MG PO TABS: 160.0000 mg | ORAL_TABLET | Freq: Every day | ORAL | Status: DC

## 2014-05-26 ENCOUNTER — Other Ambulatory Visit (INDEPENDENT_AMBULATORY_CARE_PROVIDER_SITE_OTHER): Payer: Medicare Other

## 2014-05-26 DIAGNOSIS — E785 Hyperlipidemia, unspecified: Secondary | ICD-10-CM

## 2014-05-26 DIAGNOSIS — Z79899 Other long term (current) drug therapy: Secondary | ICD-10-CM

## 2014-05-26 DIAGNOSIS — I10 Essential (primary) hypertension: Secondary | ICD-10-CM

## 2014-05-26 DIAGNOSIS — E119 Type 2 diabetes mellitus without complications: Secondary | ICD-10-CM

## 2014-05-26 DIAGNOSIS — Z0000 Encounter for general adult medical examination without abnormal findings: Secondary | ICD-10-CM

## 2014-05-26 DIAGNOSIS — Z125 Encounter for screening for malignant neoplasm of prostate: Secondary | ICD-10-CM

## 2014-05-26 LAB — URINALYSIS, ROUTINE W REFLEX MICROSCOPIC
Bilirubin Urine: NEGATIVE
Hgb urine dipstick: NEGATIVE
KETONES UR: NEGATIVE
Leukocytes, UA: NEGATIVE
Nitrite: NEGATIVE
PH: 7 (ref 5.0–8.0)
RBC / HPF: NONE SEEN (ref 0–?)
Specific Gravity, Urine: 1.015 (ref 1.000–1.030)
TOTAL PROTEIN, URINE-UPE24: NEGATIVE
Urine Glucose: NEGATIVE
Urobilinogen, UA: 0.2 (ref 0.0–1.0)
WBC, UA: NONE SEEN (ref 0–?)

## 2014-05-26 LAB — MICROALBUMIN / CREATININE URINE RATIO
CREATININE, U: 87.5 mg/dL
MICROALB UR: 2.1 mg/dL — AB (ref 0.0–1.9)
Microalb Creat Ratio: 2.4 mg/g (ref 0.0–30.0)

## 2014-05-26 LAB — CBC WITH DIFFERENTIAL/PLATELET
BASOS PCT: 0.3 % (ref 0.0–3.0)
Basophils Absolute: 0 10*3/uL (ref 0.0–0.1)
EOS PCT: 2.9 % (ref 0.0–5.0)
Eosinophils Absolute: 0.2 10*3/uL (ref 0.0–0.7)
HEMATOCRIT: 42.5 % (ref 39.0–52.0)
Hemoglobin: 14.6 g/dL (ref 13.0–17.0)
Lymphocytes Relative: 31.9 % (ref 12.0–46.0)
Lymphs Abs: 1.9 10*3/uL (ref 0.7–4.0)
MCHC: 34.3 g/dL (ref 30.0–36.0)
MCV: 100.4 fl — AB (ref 78.0–100.0)
MONO ABS: 0.4 10*3/uL (ref 0.1–1.0)
Monocytes Relative: 6.3 % (ref 3.0–12.0)
NEUTROS ABS: 3.4 10*3/uL (ref 1.4–7.7)
NEUTROS PCT: 58.6 % (ref 43.0–77.0)
Platelets: 92 10*3/uL — ABNORMAL LOW (ref 150.0–400.0)
RBC: 4.23 Mil/uL (ref 4.22–5.81)
RDW: 13.8 % (ref 11.5–15.5)
WBC: 5.9 10*3/uL (ref 4.0–10.5)

## 2014-05-26 LAB — HEMOGLOBIN A1C: Hgb A1c MFr Bld: 7.6 % — ABNORMAL HIGH (ref 4.6–6.5)

## 2014-05-27 LAB — BASIC METABOLIC PANEL
BUN: 15 mg/dL (ref 6–23)
CHLORIDE: 104 meq/L (ref 96–112)
CO2: 24 mEq/L (ref 19–32)
Calcium: 9.6 mg/dL (ref 8.4–10.5)
Creatinine, Ser: 1 mg/dL (ref 0.4–1.5)
GFR: 82.75 mL/min (ref >60.00)
Glucose, Bld: 134 mg/dL — ABNORMAL HIGH (ref 70–99)
POTASSIUM: 4.7 meq/L (ref 3.5–5.1)
Sodium: 142 mEq/L (ref 135–145)

## 2014-05-27 LAB — LIPID PANEL
Cholesterol: 142 mg/dL (ref 0–200)
HDL: 28.1 mg/dL — ABNORMAL LOW (ref >39.00)
NonHDL: 113.9
Total CHOL/HDL Ratio: 5
Triglycerides: 218 mg/dL — ABNORMAL HIGH (ref 0.0–149.0)
VLDL: 43.6 mg/dL — AB (ref 0.0–40.0)

## 2014-05-27 LAB — HEPATIC FUNCTION PANEL
ALT: 20 U/L (ref 0–53)
AST: 28 U/L (ref 0–37)
Albumin: 4.4 g/dL (ref 3.5–5.2)
Alkaline Phosphatase: 36 U/L — ABNORMAL LOW (ref 39–117)
BILIRUBIN DIRECT: 0.1 mg/dL (ref 0.0–0.3)
Total Bilirubin: 0.4 mg/dL (ref 0.2–1.2)
Total Protein: 7.2 g/dL (ref 6.0–8.3)

## 2014-05-27 LAB — TSH: TSH: 2.5 u[IU]/mL (ref 0.35–4.50)

## 2014-05-27 LAB — PSA: PSA: 0.24 ng/mL (ref 0.10–4.00)

## 2014-05-27 LAB — LDL CHOLESTEROL, DIRECT: Direct LDL: 82.5 mg/dL

## 2014-05-29 ENCOUNTER — Ambulatory Visit (INDEPENDENT_AMBULATORY_CARE_PROVIDER_SITE_OTHER): Payer: Medicare Other | Admitting: Internal Medicine

## 2014-05-29 ENCOUNTER — Encounter: Payer: Self-pay | Admitting: Internal Medicine

## 2014-05-29 VITALS — BP 142/80 | HR 88 | Temp 98.1°F | Wt 321.0 lb

## 2014-05-29 DIAGNOSIS — I10 Essential (primary) hypertension: Secondary | ICD-10-CM

## 2014-05-29 DIAGNOSIS — Z23 Encounter for immunization: Secondary | ICD-10-CM

## 2014-05-29 DIAGNOSIS — E119 Type 2 diabetes mellitus without complications: Secondary | ICD-10-CM

## 2014-05-29 DIAGNOSIS — Z0000 Encounter for general adult medical examination without abnormal findings: Secondary | ICD-10-CM

## 2014-05-29 DIAGNOSIS — M25561 Pain in right knee: Secondary | ICD-10-CM

## 2014-05-29 MED ORDER — GLIPIZIDE ER 5 MG PO TB24: 5.0000 mg | ORAL_TABLET | Freq: Every day | ORAL | Status: DC

## 2014-05-29 MED ORDER — FENOFIBRATE 160 MG PO TABS
160.0000 mg | ORAL_TABLET | Freq: Every day | ORAL | Status: DC
Start: 2014-05-29 — End: 2015-06-22

## 2014-05-29 MED ORDER — METFORMIN HCL 1000 MG PO TABS: 1000.0000 mg | ORAL_TABLET | Freq: Two times a day (BID) | ORAL | Status: DC

## 2014-05-29 MED ORDER — LABETALOL HCL 300 MG PO TABS: 300.0000 mg | ORAL_TABLET | Freq: Two times a day (BID) | ORAL | Status: DC

## 2014-05-29 MED ORDER — PIOGLITAZONE HCL 30 MG PO TABS: 30.0000 mg | ORAL_TABLET | Freq: Every day | ORAL | Status: DC

## 2014-05-29 MED ORDER — CLONAZEPAM 0.5 MG PO TABS: 0.5000 mg | ORAL_TABLET | Freq: Every day | ORAL | Status: DC

## 2014-05-29 NOTE — Patient Instructions (Addendum)
OK to stop the glimeparide (amaryl)  Please take all new medication as prescribed - the glipizide ER 5 mg per day  Please continue all other medications as before, and refills have been done if requested.  Please have the pharmacy call with any other refills you may need.  Please continue your efforts at being more active, low cholesterol diet, and weight control.  You are otherwise up to date with prevention measures today.  Please keep your appointments with your specialists as you may have planned  You will be contacted regarding the referral for: Dr Smith/sports medicine for the knee  Please return in 6 months, or sooner if needed, with Lab testing done 3-5 days before

## 2014-05-29 NOTE — Progress Notes (Signed)
Pre visit review using our clinic review tool, if applicable. No additional management support is needed unless otherwise documented below in the visit note. 

## 2014-05-29 NOTE — Progress Notes (Addendum)
Subjective:    Patient ID: Eric [name], male    DOB: 18-Jan-1951, 63 y.o.   MRN: [id number]  HPI  Here for wellness and f/u;  Overall doing ok;  Pt denies CP, worsening SOB, DOE, wheezing, orthopnea, PND, worsening LE edema, palpitations, dizziness or syncope.  Pt denies neurological change such as new headache, facial or extremity weakness.  Pt denies polydipsia, polyuria, or low sugar symptoms  Has occasional low sugar symptoms about 3-4 times per wk, has to eat to feel better. , usually only happenes with irregular diet/feeding timing. . Pt states overall good compliance with treatment and medications, good tolerability, and has been trying to follow lower cholesterol diet.  Pt denies worsening depressive symptoms, suicidal ideation or panic. No fever, night sweats, wt loss, loss of appetite, or other constitutional symptoms.  Pt states good ability with ADL's, has low fall risk, home safety reviewed and adequate, no other significant changes in hearing or vision, and only occasionally active with exercise. Has not been able to exercise as much lately due to onset right knee pain.  Has had worsening right knee pain, swelling for 2-3 wks, could not get out of hotel room last wkend at [facility] to pain with walkign, even today walking across the room - pain overall now about 7/10.  Not seeing ortho currently, but has had bilat knee arthroscopy, neck surgury, lower back surgury. Needs new lower plate for denture after he hazd gum surgury Past Medical History  Diagnosis Date  . ALLERGIC RHINITIS 06/24/2007  . 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 ANXIETY 06/24/2007  . CELLULITIS AND ABSCESS OF LEG EXCEPT FOOT 03/28/2010  . CEREBROVASCULAR ACCIDENT, HX OF 06/24/2007  . COLONIC POLYPS, HX OF 06/24/2007  . COMMON MIGRAINE 06/24/2007  . CONSTIPATION, CHRONIC, HX OF 06/24/2007  . DEPRESSION 06/24/2007  . DIABETES MELLITUS, TYPE II 07/21/2008  . DIVERTICULOSIS, COLON 06/24/2007  . HYPERLIPIDEMIA 06/24/2007  . HYPERTENSION  06/24/2007  . INSOMNIA-SLEEP DISORDER-UNSPEC 01/24/2010  . OBSTRUCTIVE SLEEP APNEA 06/24/2007  . OTHER POSTSURGICAL STATUS OTHER 06/24/2007  . SKIN LESION 01/24/2010  . Unspecified Peripheral Vascular Disease 06/24/2007  . Claudication 06/02/2011  . Coronary artery disease    Past Surgical History  Procedure Laterality Date  . C spine fusion after job related injury  1985  . Bilat knee surgury  1990  . Hand surgury  1999  . Lumbar laminectomy      reports that he has been smoking.  He does not have any smokeless tobacco history on file. He reports that he does not drink alcohol or use illicit drugs. family history includes COPD in his father; Cancer in his mother; Heart disease in his maternal grandfather and maternal grandmother. Allergies  Allergen Reactions  . Oxycodone Other (See Comments)    Sedation/somnolence   Current Outpatient Prescriptions on File Prior to Visit  Medication Sig Dispense Refill  . albuterol (PROVENTIL HFA;VENTOLIN HFA) 108 (90 BASE) MCG/ACT inhaler Inhale 2 puffs into the lungs every 6 (six) hours as needed for wheezing.  1 Inhaler  5  . aspirin 81 MG tablet Take 81 mg by mouth daily.        . Blood Glucose Monitoring Suppl (ONE TOUCH ULTRA 2) W/DEVICE KIT 1 application by Does not apply route daily.  1 each  0  . Blood Glucose Monitoring Suppl DEVI 1 Device by Does not apply route daily.  1 each  0  . diphenhydrAMINE (BENADRYL) 25 MG tablet Take 1 tablet (25 mg total)  by mouth every 6 (six) hours.  20 tablet  0  . famotidine (PEPCID) 20 MG tablet Take 1 tablet (20 mg total) by mouth 2 (two) times daily.  30 tablet  0  . Fluticasone-Salmeterol (ADVAIR DISKUS) 250-50 MCG/DOSE AEPB Inhale 1 puff into the lungs 2 (two) times daily.  1 each  11  . glimepiride (AMARYL) 4 MG tablet Take 1 tablet (4 mg total) by mouth daily with breakfast.  90 tablet  3  . glucose blood test strip Use as instructed  100 each  12  . glucose blood test strip Use as instructed  100 each  12   . hydrochlorothiazide (HYDRODIURIL) 25 MG tablet Take 1 tablet (25 mg total) by mouth daily.  30 tablet  11  . irbesartan (AVAPRO) 300 MG tablet Take 1 tablet (300 mg total) by mouth daily.  30 tablet  11  . Lancets (ACCU-CHEK SOFT TOUCH) lancets Use as instructed  100 each  12  . linagliptin (TRADJENTA) 5 MG TABS tablet Take 1 tablet (5 mg total) by mouth daily.  90 tablet  3  . simvastatin (ZOCOR) 80 MG tablet Take 1 tablet (80 mg total) by mouth at bedtime.  30 tablet  11   No current facility-administered medications on file prior to visit.      Review of Systems Constitutional: Negative for increased diaphoresis, other activity, appetite or other siginficant weight change  HENT: Negative for worsening hearing loss, ear pain, facial swelling, mouth sores and neck stiffness.   Eyes: Negative for other worsening pain, redness or visual disturbance.  Respiratory: Negative for shortness of breath and wheezing.   Cardiovascular: Negative for chest pain and palpitations.  Gastrointestinal: Negative for diarrhea, blood in stool, abdominal distention or other pain Genitourinary: Negative for hematuria, flank pain or change in urine volume.  Musculoskeletal: Negative for myalgias or other joint complaints.  Skin: Negative for color change and wound.  Neurological: Negative for syncope and numbness. other than noted Hematological: Negative for adenopathy. or other swelling Psychiatric/Behavioral: Negative for hallucinations, self-injury, decreased concentration or other worsening agitation.      Objective:   Physical Exam BP 142/80  Pulse 88  Temp(Src) 98.1 F (36.7 C) (Oral)  Wt 321 lb (145.605 kg)  SpO2 94% VS noted,  Constitutional: Pt is oriented to person, place, and time. Appears well-developed and well-nourished.  Head: Normocephalic and atraumatic.  Right Ear: External ear normal.  Left Ear: External ear normal.  Nose: Nose normal.  Mouth/Throat: Oropharynx is clear and  moist.  Eyes: Conjunctivae and EOM are normal. Pupils are equal, round, and reactive to light.  Neck: Normal range of motion. Neck supple. No JVD present. No tracheal deviation present.  Cardiovascular: Normal rate, regular rhythm, normal heart sounds and intact distal pulses.   Pulmonary/Chest: Effort normal and breath sounds without rales or wheezing  Abdominal: Soft. Bowel sounds are normal. NT. No HSM  Musculoskeletal: Normal range of motion. Exhibits no edema.  Lymphadenopathy:  Has no cervical adenopathy.  Neurological: Pt is alert and oriented to person, place, and time. Pt has normal reflexes. No cranial nerve deficit. Motor grossly intact Right knee with 2+ effusion, diffuse warmth, nontender but marked decreased ROM due to pain,has antalgic gait Skin: Skin is warm and dry. No rash noted.  Psychiatric:  Has normal mood and affect. Behavior is normal.     Assessment & Plan:

## 2014-05-31 DIAGNOSIS — M25561 Pain in right knee: Secondary | ICD-10-CM | POA: Insufficient documentation

## 2014-05-31 DIAGNOSIS — M25562 Pain in left knee: Secondary | ICD-10-CM

## 2014-05-31 NOTE — Assessment & Plan Note (Addendum)
stable overall by history and exam, recent data reviewed with pt, and pt to continue medical treatment as before except change amaryl to glipizide ER for less risk of lower sugars,  to f/u any worsening symptoms or concerns Lab Results  Component Value Date   HGBA1C 7.6* 05/26/2014

## 2014-05-31 NOTE — Assessment & Plan Note (Signed)
With effusion, mod to severe pain, for pain control, refer Dr Smith/sport med for eval - ? DJD vs cartilage tear

## 2014-05-31 NOTE — Assessment & Plan Note (Signed)

## 2014-05-31 NOTE — Assessment & Plan Note (Signed)
stable overall by history and exam, recent data reviewed with pt, and pt to continue medical treatment as before,  to f/u any worsening symptoms or concerns BP Readings from Last 3 Encounters:  05/29/14 142/80  11/27/13 122/70  06/19/13 140/72

## 2014-06-01 ENCOUNTER — Telehealth: Payer: Self-pay

## 2014-06-01 NOTE — Telephone Encounter (Signed)
Pt stated that he has not had an exam within the last 2 years.  He stated that his insurance company will not pay for an eye exam.  Pt has [employer].  Pt was encouraged to call insurance company to see if they would reimburse him for any expenses paid out of pocket.  He stated he would.

## 2014-06-29 ENCOUNTER — Other Ambulatory Visit (INDEPENDENT_AMBULATORY_CARE_PROVIDER_SITE_OTHER): Payer: Medicare Other

## 2014-06-29 ENCOUNTER — Encounter: Payer: Self-pay | Admitting: Family Medicine

## 2014-06-29 ENCOUNTER — Ambulatory Visit (INDEPENDENT_AMBULATORY_CARE_PROVIDER_SITE_OTHER): Payer: Medicare Other | Admitting: Family Medicine

## 2014-06-29 ENCOUNTER — Ambulatory Visit (INDEPENDENT_AMBULATORY_CARE_PROVIDER_SITE_OTHER)
Admission: RE | Admit: 2014-06-29 | Discharge: 2014-06-29 | Disposition: A | Discharge status: Home / Self Care | Payer: Medicare Other | Source: Ambulatory Visit | Attending: Family Medicine | Admitting: Family Medicine

## 2014-06-29 VITALS — BP 142/80 | HR 80 | Ht 73.0 in | Wt 322.0 lb

## 2014-06-29 DIAGNOSIS — R600 Localized edema: Secondary | ICD-10-CM | POA: Insufficient documentation

## 2014-06-29 DIAGNOSIS — M129 Arthropathy, unspecified: Secondary | ICD-10-CM

## 2014-06-29 DIAGNOSIS — M25561 Pain in right knee: Secondary | ICD-10-CM

## 2014-06-29 DIAGNOSIS — IMO0002 Reserved for concepts with insufficient information to code with codable children: Secondary | ICD-10-CM

## 2014-06-29 MED ORDER — GABAPENTIN 100 MG PO CAPS: 100.0000 mg | ORAL_CAPSULE | Freq: Every day | ORAL | Status: DC

## 2014-06-29 MED ORDER — FUROSEMIDE 20 MG PO TABS: 10.0000 mg | ORAL_TABLET | Freq: Every day | ORAL | Status: DC

## 2014-06-29 NOTE — Progress Notes (Signed)
[name] [address] [place], [place] [address] Phone: [phone] Subjective:    I'm seeing this patient by the request  of:  Cathlean Cower, MD   CC: right knee pain  GPQ:DIYMEBRAXE Eric [name] is a 63 y.o. male coming in with complaint of right knee pain. Patient has been diagnosed with right knee pain and has had surgery of the right knee for a meniscal injury multiple years ago. Patient has been trying to increase his activity but is kind of difficult due to the pain that he is having in this knee as well as the lower extremities. Patient denies any true injury. Patient has not been as active and has gained a lot of weight. Patient though had his teeth removed recently and he continues to gain weight even though he is not eating as much. Patient states that the knee is more of a dull aching sensation that is worse at night. This can be accompanied with cramping in the legs. Denies significant numbness but states that the sensation of the feet is not as good as it once was. Patient puts the severity of 7 out of 10. Patient has stopped certain activities such as going up stairs secondary to the pain. Has tried over-the-counter medicines with minimal improvement.     Past medical history, social, surgical and family history all reviewed in electronic medical record.   Review of Systems: No headache, visual changes, nausea, vomiting, diarrhea, constipation, dizziness, abdominal pain, skin rash, fevers, chills, night sweats, weight loss, swollen lymph nodes, body aches, joint swelling, muscle aches, chest pain, shortness of breath, mood changes.   Objective Blood pressure 142/80, pulse 80, height 6\' 1"  (1.854 m), weight 322 lb (146.058 kg), SpO2 93 %.  General: No apparent distress alert and oriented x3 mood and affect normal, dressed appropriately.  HEENT: Pupils equal, extraocular movements intact  Respiratory: Patient's speak in full sentences and does not  appear short of breath  Cardiovascular: 2+ pitting edema lower extremity edema, non tender, no erythema  Skin: Warm dry intact with no signs of infection or rash on extremities or on axial skeleton.  Abdomen: Soft nontender  Neuro: Cranial nerves II through XII are intact, neurovascularly intact in all extremities with 2+ DTRs and 2+ pulses.  Lymph: No lymphadenopathy of posterior or anterior cervical chain or axillae bilaterally.  Gait normal with good balance and coordination.  MSK:  Non tender with full range of motion and good stability and symmetric strength and tone of shoulders, elbows, wrist, hip, and ankles bilaterally.  Knee:right Normal to inspection with no erythema or effusion or obvious bony abnormalities.patient does have 2+ pitting edema of the lower extremities bilaterally Palpation reveals that patient is tender to palpation over the medial joint line ROM full in flexion and extension and lower leg rotation. Ligaments with solid consistent endpoints including ACL, PCL, LCL, MCL. Negative Mcmurray's, Apley's, and Thessalonian tests. Non painful patellar compression. Patellar glide With severecrepitus. Patellar and quadriceps tendons unremarkable. Hamstring and quadriceps strength is normal.  Contralateral knee has crepitus as well and minimally tender over the medial joint line.   MSK US performed NM:MHWKG knee This study was ordered, performed, and interpreted by Charlann Boxer D.O.  Knee: All structures visualized. Patient has had a removal of the lateral meniscus of the right knee. Patient does have degenerative changes of the moderate narrowing of the medial and lateral joint space. Patellar Tendon unremarkable on long and transverse views without effusion.  No abnormality of prepatellar bursa. LCL and MCL unremarkable on long and transverse views. No abnormality of origin of medial or lateral head of the gastrocnemius.  IMPRESSION:  Moderate osteoarthritic changes of  the knee with degenerative changes of the medial meniscus.   Procedure: Real-time Ultrasound Guided Injection of right knee Device: GE Logiq E  Ultrasound guided injection is preferred based studies that show increased duration, increased effect, greater accuracy, decreased procedural pain, increased response rate, and decreased cost with ultrasound guided versus blind injection.  Verbal informed consent obtained.  Time-out conducted.  Noted no overlying erythema, induration, or other signs of local infection.  Skin prepped in a sterile fashion.  Local anesthesia: Topical Ethyl chloride.  With sterile technique and under real time ultrasound guidance: With a 22-gauge 2 inch needle patient was injected with 4 cc of 0.5% Marcaine and 1 cc of Kenalog 40 mg/dL. This was from a superior lateral approach.  Completed without difficulty  Pain immediately resolved suggesting accurate placement of the medication.  Advised to call if fevers/chills, erythema, induration, drainage, or persistent bleeding.  Images permanently stored and available for review in the ultrasound unit.  Impression: Technically successful ultrasound guided injection.     Impression and Recommendations:     This case required medical decision making of moderate complexity.

## 2014-06-29 NOTE — Patient Instructions (Addendum)
Good to meet you Consider compression stocking to help with the swelling.  HCTZ in AM.  Lasix 10 mg in AM.  Gabapentin 100mg  at night Wear the brace with walking.  Ice 20 minutes 2 times daily. Usually after activity and before bed. Exercises 3 times a week.  Xrays today Vitamin D 2000 IU daily See me again in 3 weeks.

## 2014-06-29 NOTE — Assessment & Plan Note (Signed)
Patient did have injection today which I think will be beneficial. I do think the patient does have a history of claudication also has lower extremity edema bilaterally. I think that this can contribute to some of his cramping pain. Patient was given a brace today, icing protocol, discussed topical anti-inflammatories over-the-counter medicines the can be beneficial. Patient and will come back and see me again in 3 weeks. Patient does not make any significant improvement we may need to consider formal physical therapy.

## 2014-06-29 NOTE — Assessment & Plan Note (Signed)
Patient does have large hemi-edema bilaterally. Patient given Lasix and will take this daily at a very low dose. Patient will continue all his other medications at this time. Discussed timing about Jacquard thiazide and doing more in the daytime which I think will be helpful. Patient was taken at night and waking up multiple times to urinate. Patient and will come back and see me again in 3 weeks for further evaluation.

## 2014-07-20 ENCOUNTER — Telehealth: Payer: Self-pay | Admitting: Family Medicine

## 2014-07-20 ENCOUNTER — Encounter: Payer: Self-pay | Admitting: Family Medicine

## 2014-07-20 ENCOUNTER — Ambulatory Visit (HOSPITAL_COMMUNITY)
Admission: RE | Admit: 2014-07-20 | Discharge: 2014-07-20 | Disposition: A | Discharge status: Home / Self Care | Payer: Medicare Other | Source: Ambulatory Visit | Attending: Family Medicine | Admitting: Family Medicine

## 2014-07-20 ENCOUNTER — Ambulatory Visit (INDEPENDENT_AMBULATORY_CARE_PROVIDER_SITE_OTHER): Payer: Medicare Other | Admitting: Family Medicine

## 2014-07-20 VITALS — BP 116/68 | HR 87 | Ht 73.0 in | Wt 318.0 lb

## 2014-07-20 DIAGNOSIS — M5416 Radiculopathy, lumbar region: Secondary | ICD-10-CM

## 2014-07-20 DIAGNOSIS — I714 Abdominal aortic aneurysm, without rupture, unspecified: Secondary | ICD-10-CM

## 2014-07-20 DIAGNOSIS — M545 Low back pain: Secondary | ICD-10-CM | POA: Diagnosis present

## 2014-07-20 DIAGNOSIS — I700 Atherosclerosis of aorta: Secondary | ICD-10-CM | POA: Insufficient documentation

## 2014-07-20 DIAGNOSIS — M4806 Spinal stenosis, lumbar region: Secondary | ICD-10-CM | POA: Diagnosis not present

## 2014-07-20 DIAGNOSIS — Z87828 Personal history of other (healed) physical injury and trauma: Secondary | ICD-10-CM | POA: Insufficient documentation

## 2014-07-20 DIAGNOSIS — IMO0002 Reserved for concepts with insufficient information to code with codable children: Secondary | ICD-10-CM

## 2014-07-20 DIAGNOSIS — M129 Arthropathy, unspecified: Secondary | ICD-10-CM

## 2014-07-20 DIAGNOSIS — Z981 Arthrodesis status: Secondary | ICD-10-CM | POA: Insufficient documentation

## 2014-07-20 DIAGNOSIS — R600 Localized edema: Secondary | ICD-10-CM

## 2014-07-20 DIAGNOSIS — M5136 Other intervertebral disc degeneration, lumbar region: Secondary | ICD-10-CM | POA: Insufficient documentation

## 2014-07-20 DIAGNOSIS — R202 Paresthesia of skin: Secondary | ICD-10-CM | POA: Diagnosis present

## 2014-07-20 MED ORDER — DICLOFENAC SODIUM 2 % TD SOLN: TRANSDERMAL | Status: DC

## 2014-07-20 NOTE — Telephone Encounter (Signed)
Called patient back to discuss x-ray findings. I think patient's abdominal aortic aneurysm that is noted on x-ray today should be further evaluated. Patient will be referred to vascular. We'll not order any imaging at this time. We would consider an ultrasound but we will let them order the proper imaging studies. Discussed with patient who is more concerned about his insurance. Patient is going to check and see if this would be covered. Discuss that if he has any worsening abdominal pain or significant more weakness in the [facility] is to seek medical attention immediately. Patient understands.

## 2014-07-20 NOTE — Assessment & Plan Note (Signed)
I believe the patient is having lumbar radiculopathy that is giving him claudication. This is likely causing him his pain. 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 X-rays will be ordered today. We discussed the possibility of him following up with neurosurgery but patient states that he is not a surgical candidate and does not see any benefit of this at this time. I would like to discuss this further with him in the long run. I think that if this is more of a neural claudication this might be more helpful. Patient will increase his gabapentin at this time. Patient and will come back and see me again in 4-6 weeks.

## 2014-07-20 NOTE — Patient Instructions (Addendum)
Good to see you Eric [name] is your friend.  B12 158mcg daily can help with some of the cramping.  Continue to work on the weight.  We will get xray of your back today.  I am giving you exercises for you back to do 3 times a week.  Water aerobics and biking would be great! Weight only 1-2 times a week.  Gabapentin up to 200mg  at night, if too roggy then go back to 1 pill.  For you knees consider the other injections after the first of the year.  Ice is still your friend.  See me again in 4-6 weeks.

## 2014-07-20 NOTE — Assessment & Plan Note (Signed)
Patient has responded well to the diuretic. Encourage him to continue this on a regular basis. We discussed about getting pacing metabolic panel which patient declined. Told him that I do think that this will be very beneficial for chronic continue this medication. Once again patient declined. This is helping his lower extremity edema which I think will be beneficial. Discuss to be safely should half the medication.

## 2014-07-20 NOTE — Assessment & Plan Note (Signed)
Bilateral osteophytic changes of the knees. This is likely contributing to some of his pain but I do think some of his radicular symptoms are from his back as well. Patient does have a history of claudication as well. Patient is going to be very difficult to manage secondary to his other comorbidities. Patient states that he is not a surgical candidate secondary to his severe sleep apnea, cerebrovascular accident, as well as diabetes. We will discuss further if any intervention is necessary. We discussed the possibility of viscous supplementation. Patient declined this today. We discussed the possibility of formal physical therapy which patient also declined. Discuss continuing the bracing as well as icing. Patient was given a prescription for topical anti-inflammatories. Patient will try this and come back again in 4-6 weeks for further evaluation.

## 2014-07-20 NOTE — Progress Notes (Signed)
  [name] [place], [place] [address] Phone: [phone] Subjective:    CC: right knee pain follow up  H[facility]:[id number] Eric [name] is a 63 y.o. male coming in with complaint of right knee pain. Patient has been diagnosed with right knee pain and has had surgery of the right knee for a meniscal injury multiple years ago. Patient was seen by me 3 weeks ago and was diagnosed with an acute on chronic meniscal tear as well as moderate osteophytic changes. Patient was given a steroid injection under ultrasound guidance.Patient also x-ray showing the patient did have moderate to severe knee arthritis most significantly in the lateral compartments.   Patient was given a brace, home exercises, and icing protocol. Patient states that the injection did not make any significant improvement. Patient is actually complaining of pain now that seems to be associated with his back. Patient states that he walks more than 100-200 feet he has to stop secondary to pain in his legs. Patient states that this is followed by cramping. Patient denies any specific weakness. 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    Past medical history, social, surgical and family history all reviewed in electronic medical record.   Review of Systems: No headache, visual changes, nausea, vomiting, diarrhea, constipation, dizziness, abdominal pain, skin rash, fevers, chills, night sweats, weight loss, swollen lymph nodes, body aches, joint swelling, muscle aches, chest pain, shortness of breath, mood changes.   Objective Blood pressure 116/68, pulse 87, height 6\' 1"  (1.854 m), weight 318 lb (144.244 kg), SpO2 94 %.  General: No apparent distress alert and oriented x3 mood and affect normal, dressed appropriately.  HEENT: Pupils equal, extraocular movements intact  Respiratory: Patient's speak  in full sentences and does not appear short of breath  Cardiovascular: 2+ pitting edema lower extremity edema, non tender, no erythema  Skin: Warm dry intact with no signs of infection or rash on extremities or on axial skeleton.  Abdomen: Soft nontender  Neuro: Cranial nerves II through XII are intact, neurovascularly intact in all extremities with 2+ DTRs and 2+ pulses.  Lymph: No lymphadenopathy of posterior or anterior cervical chain or axillae bilaterally.  Gait normal with good balance and coordination.  MSK:  Non tender with full range of motion and good stability and symmetric strength and tone of shoulders, elbows, wrist, hip, and ankles bilaterally.  Knee:right Significant decrease in pitting edema from previous exam. Palpation reveals that patient is tender to palpation over the medial joint line ROM full in flexion and extension and lower leg rotation. Ligaments with solid consistent endpoints including ACL, PCL, LCL, MCL. Negative Mcmurray's, Apley's, and Thessalonian tests. Non painful patellar compression. Patellar glide With severecrepitus. Patellar and quadriceps tendons unremarkable. Hamstring and quadriceps strength is normal.  Contralateral knee has crepitus as well and minimally tender over the medial joint line.      Impression and Recommendations:     This case required medical decision making of moderate complexity.

## 2014-07-22 ENCOUNTER — Telehealth: Payer: Self-pay | Admitting: *Deleted

## 2014-07-22 MED ORDER — DICLOFENAC SODIUM 2 % TD SOLN: TRANSDERMAL | Status: DC

## 2014-07-22 NOTE — Telephone Encounter (Signed)
Re-sent rx into pharmacy, lmovm making pt aware & gave him the pharmacy's phone # if he wanted to call them instead of them calling him.

## 2014-07-22 NOTE — Telephone Encounter (Signed)
Left msg on triage stating Dr. Tamala Julian was suppose to send rx to [facility]. They stated they have not received any rx...Johny Chess

## 2014-07-24 ENCOUNTER — Other Ambulatory Visit: Payer: Self-pay | Admitting: *Deleted

## 2014-07-24 MED ORDER — GABAPENTIN 100 MG PO CAPS: 200.0000 mg | ORAL_CAPSULE | Freq: Every day | ORAL | Status: DC

## 2014-07-24 NOTE — Telephone Encounter (Signed)
Refill done.  

## 2014-08-04 ENCOUNTER — Other Ambulatory Visit: Payer: Self-pay | Admitting: *Deleted

## 2014-08-04 DIAGNOSIS — I714 Abdominal aortic aneurysm, without rupture, unspecified: Secondary | ICD-10-CM

## 2014-08-05 ENCOUNTER — Telehealth: Payer: Self-pay | Admitting: Family Medicine

## 2014-08-05 NOTE — Telephone Encounter (Signed)
Spoke to pt, he stated that he did not want to go to the appt because he did not have a good experience last time & he did not want to see Dr. Amedeo Plenty again. I explained to the pt that he is not seeing Dr. Amedeo Plenty this time & that he was seeing Dr. Bridgett Larsson. Pt stated that he would go to the appointment then. I explained to the pt again that there has been change over time & he does need to go to the appt for them to check it again. Pt understood & stated he would go.

## 2014-08-05 NOTE — Telephone Encounter (Signed)
Yes he needs to be seen, it has changed over time and they need to look at it in greater detail.  Thank you

## 2014-08-05 NOTE — Telephone Encounter (Signed)
Patient states he was referred to vascular and vein specialist.  He states he has already been to this office and they told him that there was nothing they could do for him.  He is not sure if he should keep the appointment or not.  Can you please follow up.

## 2014-08-25 ENCOUNTER — Encounter: Payer: Self-pay | Admitting: Family Medicine

## 2014-08-25 ENCOUNTER — Ambulatory Visit (INDEPENDENT_AMBULATORY_CARE_PROVIDER_SITE_OTHER): Payer: Medicare Other | Admitting: Family Medicine

## 2014-08-25 VITALS — BP 150/80 | HR 90 | Ht 73.0 in | Wt 327.0 lb

## 2014-08-25 DIAGNOSIS — M129 Arthropathy, unspecified: Secondary | ICD-10-CM | POA: Diagnosis not present

## 2014-08-25 DIAGNOSIS — IMO0002 Reserved for concepts with insufficient information to code with codable children: Secondary | ICD-10-CM

## 2014-08-25 NOTE — Progress Notes (Signed)
  [name] [address] [place], [place] [address] Phone: [phone] Subjective:    CC: right knee pain follow up  RCV:[id number] Eric [name] is a 64 y.o. male coming in with complaint of right knee pain. Patient has been diagnosed with right knee pain and has had surgery of the right knee for a meniscal injury multiple years ago. Patient was seen by me 3 weeks ago and was diagnosed with an acute on chronic meniscal tear as well as moderate to severe osteophytic changes. Patient was doing somewhat better after the injection was started having more pain again. Patient states overall he is having difficulty even with ambulation. Patient is continuing to get the weakness in the legs. Patient is to be following up with vascular for the abdominal aortic aneurysm that I think is likely causing that. Patient states though that the knee pain is severe enough that it can wake him up at night. Patient is wondering what else can be done.     .   Past medical history, social, surgical and family history all reviewed in electronic medical record.   Review of Systems: No headache, visual changes, nausea, vomiting, diarrhea, constipation, dizziness, abdominal pain, skin rash, fevers, chills, night sweats, weight loss, swollen lymph nodes, body aches, joint swelling, muscle aches, chest pain, shortness of breath, mood changes.   Objective Blood pressure 150/80, pulse 90, height 6\' 1"  (1.854 m), weight 327 lb (148.326 kg), SpO2 94 %.  General: No apparent distress alert and oriented x3 mood and affect normal, dressed appropriately.  HEENT: Pupils equal, extraocular movements intact  Respiratory: Patient's speak in full sentences and does not appear short of breath  Cardiovascular: 2+ pitting edema lower extremity edema, non tender, no erythema  Skin: Warm dry intact with no signs of infection or rash on extremities or on axial skeleton.  Abdomen: Soft nontender  Neuro:  Cranial nerves II through XII are intact, neurovascularly intact in all extremities with 2+ DTRs and 2+ pulses.  Lymph: No lymphadenopathy of posterior or anterior cervical chain or axillae bilaterally.  Gait normal with good balance and coordination.  MSK:  Non tender with full range of motion and good stability and symmetric strength and tone of shoulders, elbows, wrist, hip, and ankles bilaterally.  Knee:right Significant decrease in pitting edema from previous exam. Palpation reveals that patient is tender to palpation over the medial joint line ROM full in flexion and extension and lower leg rotation. Ligaments with solid consistent endpoints including ACL, PCL, LCL, MCL. Negative Mcmurray's, Apley's, and Thessalonian tests. Non painful patellar compression. Patellar glide With severecrepitus. Patellar and quadriceps tendons unremarkable. Hamstring and quadriceps strength is normal.  Contralateral knee has crepitus as well and minimally tender over the medial joint line.  After informed written and verbal consent, patient was seated on exam table. Right knee was prepped with alcohol swab and utilizing anterolateral approach, patient's right knee space was injected with 4:1  marcaine 0.5%: Kenalog 40mg /dL. Patient tolerated the procedure well without immediate complications.    Impression and Recommendations:     This case required medical decision making of moderate complexity.

## 2014-08-25 NOTE — Patient Instructions (Signed)
Good to see you Eric [name] is your friend.  Continue to try to stay active but avoid weights for 1 week.  Then come back in 2 weeks and we can start the injections.  I wish you the best with vascular and we will see what is going on.

## 2014-08-25 NOTE — Assessment & Plan Note (Signed)
Patient was given an injection today. We discussed that viscous supplementation would be the next step if he continues to have pain. Encourage patient to follow-up for the abdominal aortic aneurysm that I think is causing patient's claudication systems. Depending on those findings that might cause for further intervention. Patient will come back and see me again in 2-3 weeks. Patient declined formal physical therapy.

## 2014-08-27 ENCOUNTER — Encounter: Payer: Self-pay | Admitting: Vascular Surgery

## 2014-08-28 ENCOUNTER — Encounter: Payer: Self-pay | Admitting: Vascular Surgery

## 2014-08-28 ENCOUNTER — Ambulatory Visit (INDEPENDENT_AMBULATORY_CARE_PROVIDER_SITE_OTHER): Payer: Medicare Other | Admitting: Vascular Surgery

## 2014-08-28 ENCOUNTER — Ambulatory Visit (HOSPITAL_COMMUNITY)
Admission: RE | Admit: 2014-08-28 | Discharge: 2014-08-28 | Disposition: A | Discharge status: Home / Self Care | Payer: Medicare Other | Source: Ambulatory Visit | Attending: Vascular Surgery | Admitting: Vascular Surgery

## 2014-08-28 VITALS — BP 156/75 | HR 75 | Ht 73.0 in | Wt 324.0 lb

## 2014-08-28 DIAGNOSIS — I714 Abdominal aortic aneurysm, without rupture, unspecified: Secondary | ICD-10-CM

## 2014-08-28 DIAGNOSIS — I70219 Atherosclerosis of native arteries of extremities with intermittent claudication, unspecified extremity: Secondary | ICD-10-CM

## 2014-08-28 DIAGNOSIS — R29898 Other symptoms and signs involving the musculoskeletal system: Secondary | ICD-10-CM

## 2014-08-28 NOTE — Progress Notes (Signed)
Referred by: Biagio Borg, MD [place], [place] [address]  Reason for referral: AAA  History of Present Illness  The patient is a 64 y.o. (1951/08/21) male who presents with chief complaint: aneurysm on x-ray.  Pt recently was seen for back pain and on x-ray an AAA was found.  This patient was previously seen by Dr. Amedeo Plenty for intermittent claudication and found to be a poor surgical candidate.  The patient does have chronic back pain without abdominal pain.  The patient does not have history of embolic episodes from the AAA.  The patient's risk factors for AAA included: male, age, and active smoking.  The patient continues to smoke cigarettes.  In regards to his intermittent claudication, he does not ambulate much so he doesn't think it is lifestyle limiting.  He denies any gangrene or ulcers or rest pain.  Past Medical History  Diagnosis Date  . ALLERGIC RHINITIS 06/24/2007  . 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 ANXIETY 06/24/2007  . CELLULITIS AND ABSCESS OF LEG EXCEPT FOOT 03/28/2010  . CEREBROVASCULAR ACCIDENT, HX OF 06/24/2007  . COLONIC POLYPS, HX OF 06/24/2007  . COMMON MIGRAINE 06/24/2007  . CONSTIPATION, CHRONIC, HX OF 06/24/2007  . DEPRESSION 06/24/2007  . DIABETES MELLITUS, TYPE II 07/21/2008  . DIVERTICULOSIS, COLON 06/24/2007  . HYPERLIPIDEMIA 06/24/2007  . HYPERTENSION 06/24/2007  . INSOMNIA-SLEEP DISORDER-UNSPEC 01/24/2010  . OBSTRUCTIVE SLEEP APNEA 06/24/2007  . OTHER POSTSURGICAL STATUS OTHER 06/24/2007  . SKIN LESION 01/24/2010  . Unspecified Peripheral Vascular Disease 06/24/2007  . Claudication 06/02/2011  . Coronary artery disease   . COPD (chronic obstructive pulmonary disease)   . DVT (deep venous thrombosis)   . Myocardial infarction     Past Surgical History  Procedure Laterality Date  . C spine fusion after job related injury  1985  . Bilat knee surgury  1990  . Hand surgury  1999  . Lumbar laminectomy      History   Social History  . Marital  Status: Married    Spouse Name: N/A    Number of Children: 3  . Years of Education: N/A   Occupational History  . 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 Smoking status: Current Every Day Smoker -- 60 years    Types: Cigars  . Smokeless tobacco: Not on file     Comment: pt states he smokes 5 cigars daily  . Alcohol Use: No  . Drug Use: No  . Sexual Activity: Not on file   Other Topics Concern  . Not on file   Social History Narrative    Family History  Problem Relation Age of Onset  . Cancer Mother     lung cancer  . COPD Father   . Heart disease Father   . Heart disease Maternal Grandmother   . Heart disease Maternal Grandfather   . Heart disease Brother   . Hypertension Brother   . Heart attack Brother   . Peripheral vascular disease Brother     Current Outpatient Prescriptions on File Prior to Visit  Medication Sig Dispense Refill  . albuterol (PROVENTIL HFA;VENTOLIN HFA) 108 (90 BASE) MCG/ACT inhaler Inhale 2 puffs into the lungs every 6 (six) hours as needed for wheezing. 1 Inhaler 5  . aspirin 81 MG tablet Take 81 mg by mouth daily.      . clonazePAM (KLONOPIN) 0.5 MG tablet Take 1-2 tablets (  0.5-1 mg total) by mouth at bedtime. Pt can take up to 2 depending on anxiety level 60 tablet 5  . fenofibrate 160 MG tablet Take 1 tablet (160 mg total) by mouth daily. 30 tablet 11  . furosemide (LASIX) 20 MG tablet Take 0.5 tablets (10 mg total) by mouth daily. 30 tablet 1  . gabapentin (NEURONTIN) 100 MG capsule Take 2 capsules (200 mg total) by mouth at bedtime. 60 capsule 3  . glipiZIDE (GLUCOTROL XL) 5 MG 24 hr tablet Take 1 tablet (5 mg total) by mouth daily with breakfast. 90 tablet 3  . hydrochlorothiazide (HYDRODIURIL) 25 MG tablet Take 1 tablet (25 mg total) by mouth daily. 30 tablet 11  . irbesartan (AVAPRO) 300 MG tablet Take 1 tablet (300 mg total) by mouth daily. 30 tablet 11  . labetalol (NORMODYNE)  300 MG tablet Take 1 tablet (300 mg total) by mouth 2 (two) times daily. 60 tablet 11  . metFORMIN (GLUCOPHAGE) 1000 MG tablet Take 1 tablet (1,000 mg total) by mouth 2 (two) times daily with a meal. 60 tablet 11  . pioglitazone (ACTOS) 30 MG tablet Take 1 tablet (30 mg total) by mouth daily. 30 tablet 11  . simvastatin (ZOCOR) 80 MG tablet Take 1 tablet (80 mg total) by mouth at bedtime. 30 tablet 11  . Blood Glucose Monitoring Suppl DEVI 1 Device by Does not apply route daily. (Patient not taking: Reported on 08/28/2014) 1 each 0  . Diclofenac Sodium 2 % SOLN Apply 1 pump twice daily. (Patient not taking: Reported on 08/28/2014) 112 g 3  . diphenhydrAMINE (BENADRYL) 25 MG tablet Take 1 tablet (25 mg total) by mouth every 6 (six) hours. (Patient not taking: Reported on 08/28/2014) 20 tablet 0  . famotidine (PEPCID) 20 MG tablet Take 1 tablet (20 mg total) by mouth 2 (two) times daily. (Patient not taking: Reported on 08/28/2014) 30 tablet 0  . Fluticasone-Salmeterol (ADVAIR DISKUS) 250-50 MCG/DOSE AEPB Inhale 1 puff into the lungs 2 (two) times daily. 1 each 11  . glucose blood test strip Use as instructed (Patient not taking: Reported on 08/28/2014) 100 each 12  . glucose blood test strip Use as instructed (Patient not taking: Reported on 08/28/2014) 100 each 12  . Lancets (ACCU-CHEK SOFT TOUCH) lancets Use as instructed (Patient not taking: Reported on 08/28/2014) 100 each 12  . linagliptin (TRADJENTA) 5 MG TABS tablet Take 1 tablet (5 mg total) by mouth daily. (Patient not taking: Reported on 08/28/2014) 90 tablet 3   No current facility-administered medications on file prior to visit.    Allergies  Allergen Reactions  . Oxycodone Other (See Comments)    Sedation/somnolence    REVIEW OF SYSTEMS:  (Positives checked otherwise negative)  CARDIOVASCULAR:  []  chest pain, [x]  chest pressure, []  palpitations, [x]  shortness of breath when laying flat, [x]  shortness of breath with exertion,  [x]  pain in feet  when walking, [x]  pain in feet when laying flat, []  history of blood clot in veins (DVT), []  history of phlebitis, [x]  swelling in legs, []  varicose veins  PULMONARY:  [x]  productive cough, []  asthma, [x]  wheezing  NEUROLOGIC:  [x]  weakness in arms or legs, [x]  numbness in arms or legs, [x]  difficulty speaking or slurred speech, []  temporary loss of vision in one eye, [x]  dizziness  HEMATOLOGIC:  []  bleeding problems, []  problems with blood clotting too easily  MUSCULOSKEL:  []  joint pain, []  joint swelling  GASTROINTEST:  []  vomiting blood, [x]  blood in stool  GENITOURINARY:  []  burning with urination, []  blood in urine  PSYCHIATRIC:  [x]  history of major depression  INTEGUMENTARY:  []  rashes, []  ulcers  CONSTITUTIONAL:  []  fever, []  chills  For VQI Use Only   PRE-ADM LIVING: Home  AMB STATUS: Ambulatory  CAD Sx: None  PRIOR CHF: None  STRESS TEST: [x ] No, [ ]  Normal, [ ]  + ischemia, [ ]  + MI, [ ]  Both   Physical Examination  Filed Vitals:   08/28/14 0902  BP: 156/75  Pulse: 75  Height: 6\' 1"  (1.854 m)  Weight: 324 lb (146.965 kg)  SpO2: 98%   Body mass index is 42.76 kg/(m^2).  General: A&O x 3, WDWN, morbidly obese  Head: /AT  Ear/Nose/Throat: Hearing grossly intact, nares w/o erythema or drainage, oropharynx w/o Erythema/Exudate  Eyes: PERRLA, EOMI  Neck: Supple, no nuchal rigidity, no palpable LAD  Pulmonary: Sym exp, good air movt, CTAB, no rales, rhonchi, & wheezing  Cardiac: RRR, Nl S1, S2, no Murmurs, rubs or gallops  Vascular: Vessel Right Left  Radial Palpable Palpable  Brachial Palpable Palpable  Carotid Palpable, without bruit Palpable, without bruit  Aorta Not palpable due to large pannus N/A  Femoral Palpable Palpable  Popliteal Palpable Not palpable  PT Not Palpable Not Palpable  DP Not Palpable Not Palpable   Gastrointestinal: soft, NTND, -G/R, - HSM, - masses, - CVAT B  Musculoskeletal: M/S 5/5 throughout , Extremities  without ischemic changes , B spider veins, BLE edema 1+  Neurologic: CN 2-12 intact , Pain and light touch intact in extremities , Motor exam as listed above  Psychiatric: Judgment intact, Mood & affect appropriate for pt's clinical situation  Dermatologic: See M/S exam for extremity exam, no rashes otherwise noted  Lymph : No Cervical, Axillary, or Inguinal lymphadenopathy    Non-Invasive Vascular Imaging  AAA Duplex (Date: 08/28/2014 )  Difficult study due to body habitus and gas  Proximal Aorta: ~3.3 cm   Medical Decision Making  The patient is a 64 y.o. male who presents with: small asx AAA, BLE IC   Based on this patient's exam and diagnostic studies, he needs BLE ABI and bilateral popliteal arterial duplex to evaluate for aneurysms  The threshold for repair is AAA size > 5.5 cm, growth > 1 cm/yr, and symptomatic status.  The patient will follow up in 2-4 weeks with the above studies.  I emphasized the importance of maximal medical management including strict control of blood pressure, blood glucose, and lipid levels, antiplatelet agents, obtaining regular exercise, and cessation of smoking.    Thank you for allowing [place] to participate in this patient's care.  Adele Barthel, MD [facility] Office: [phone] Pager: [phone]  08/28/2014, 1:39 PM

## 2014-09-08 ENCOUNTER — Ambulatory Visit (INDEPENDENT_AMBULATORY_CARE_PROVIDER_SITE_OTHER): Payer: Medicare Other | Admitting: Family Medicine

## 2014-09-08 ENCOUNTER — Encounter: Payer: Self-pay | Admitting: Family Medicine

## 2014-09-08 VITALS — BP 114/62 | HR 87 | Ht 73.0 in | Wt 326.0 lb

## 2014-09-08 DIAGNOSIS — M129 Arthropathy, unspecified: Secondary | ICD-10-CM

## 2014-09-08 DIAGNOSIS — M1711 Unilateral primary osteoarthritis, right knee: Secondary | ICD-10-CM | POA: Insufficient documentation

## 2014-09-08 NOTE — Progress Notes (Signed)
  [name] [place], [name] [address] Phone: [phone] Subjective:    CC: right knee pain follow up  [id number] Eric [name] is a 64 y.o. male coming in with complaint of right knee pain. Patient has been diagnosed with right knee pain and has had surgery of the right knee for a meniscal injury multiple years ago.  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 Patient will 2 weeks ago did have a steroid injection. Patient was to do home exercises, icing protocol and was given a brace. Patient states overall he has not been wearing the brace. Patient has been doing the icing somewhat. Patient states that he is approximately 20% better after the injection. Still having pain with his activities of daily living. Patient states the pain can still wake him up at night as well.  Patient did follow up for the abdominal aortic aneurysm with vascular. They would like to do more test but patient states that his insurance will not pay for it. Patient does not want to do any of the other tests.     .   Past medical history, social, surgical and family history all reviewed in electronic medical record.   Review of Systems: No headache, visual changes, nausea, vomiting, diarrhea, constipation, dizziness, abdominal pain, skin rash, fevers, chills, night sweats, weight loss, swollen lymph nodes, body aches, joint swelling, muscle aches, chest pain, shortness of breath, mood changes.   Objective Blood pressure 114/62, pulse 87, height 6\' 1"  (1.854 m), weight 326 lb (147.873 kg), SpO2 95 %.  General: No apparent distress alert and oriented x3 mood and affect normal, dressed appropriately.  HEENT: Pupils equal, extraocular movements intact  Respiratory: Patient's speak in full sentences and does not appear short of breath  Cardiovascular: 2+ pitting edema lower extremity edema, non tender, no erythema  Skin:  Warm dry intact with no signs of infection or rash on extremities or on axial skeleton.  Abdomen: Soft nontender  Neuro: Cranial nerves II through XII are intact, neurovascularly intact in all extremities with 2+ DTRs and 2+ pulses.  Lymph: No lymphadenopathy of posterior or anterior cervical chain or axillae bilaterally.  Gait normal with good balance and coordination.  MSK:  Non tender with full range of motion and good stability and symmetric strength and tone of shoulders, elbows, wrist, hip, and ankles bilaterally.  Knee:right Significant decrease in pitting edema from previous exam. Palpation reveals that patient is tender to palpation over the medial joint line ROM full in flexion and extension and lower leg rotation. Ligaments with solid consistent endpoints including ACL, PCL, LCL, MCL. Negative Mcmurray's, Apley's, and Thessalonian tests. Non painful patellar compression. Patellar glide With severecrepitus. Patellar and quadriceps tendons unremarkable. Hamstring and quadriceps strength is normal.  No significant change from previous exam Contralateral knee has crepitus as well and minimally tender over the medial joint line.  After informed written and verbal consent, patient was seated on exam table. Right knee was prepped with alcohol swab and utilizing anterolateral approach, patient's right knee space was injected with 16 mg/2.5 mL of Orthovisc (sodium hyaluronate) in a prefilled syringe was injected easily into the knee through a 22-gauge needle.. Patient tolerated the procedure well without immediate complications.    Impression and Recommendations:     This case required medical decision making of moderate complexity.

## 2014-09-08 NOTE — Assessment & Plan Note (Signed)
Patient was started on the series today. I do not think patient would be able to participate in formal physical therapy and has already worked with Product/process development scientist. Patient is failed all other conservative therapy at this time. Patient does have many comorbidities and would be a high risk surgical patient's wound like to do it all other treatment options. Patient was started on Orthovisc injections today. Patient encouraged to continue the icing, bracing, and home exercises. Patient will come back and see me again in 1 week for second in a series of 3 or 4 injections.

## 2014-09-08 NOTE — Patient Instructions (Signed)
Good to see you Started orthovisc today.  Ice in 6 hours and then every night.  See me again in 1 week and we will do another injection.

## 2014-09-16 ENCOUNTER — Ambulatory Visit: Payer: Medicare Other | Admitting: Family Medicine

## 2014-09-18 ENCOUNTER — Other Ambulatory Visit (HOSPITAL_COMMUNITY): Payer: Medicare Other

## 2014-09-18 ENCOUNTER — Encounter (HOSPITAL_COMMUNITY): Payer: Medicare Other

## 2014-09-18 ENCOUNTER — Ambulatory Visit: Payer: Medicare Other | Admitting: Vascular Surgery

## 2014-09-21 ENCOUNTER — Encounter: Payer: Self-pay | Admitting: Family Medicine

## 2014-09-21 ENCOUNTER — Ambulatory Visit (INDEPENDENT_AMBULATORY_CARE_PROVIDER_SITE_OTHER): Payer: Medicare Other | Admitting: Family Medicine

## 2014-09-21 VITALS — BP 118/66 | HR 100 | Ht 73.0 in | Wt 326.0 lb

## 2014-09-21 DIAGNOSIS — M129 Arthropathy, unspecified: Secondary | ICD-10-CM

## 2014-09-21 DIAGNOSIS — M1711 Unilateral primary osteoarthritis, right knee: Secondary | ICD-10-CM

## 2014-09-21 NOTE — Patient Instructions (Addendum)
Good to see you Ice is your friend Continue the brace when you need it.  Try the duexis 3 times a day for 3 days.  Magnesium citrate pover the countewr will help.  See me again in 1 weeks for 3rd injection.

## 2014-09-21 NOTE — Progress Notes (Signed)
  [name] [place], [place] [address] Phone: [phone] Subjective:    CC: right knee pain follow up  [id number] Eric [name] is a 64 y.o. male coming in with complaint of right knee pain. Patient has been diagnosed with right knee pain and has had surgery of the right knee for a meniscal injury multiple years ago.  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 Patient continued to have difficulty with his right knee. Patient was started on Orthovisc injections last week. Patient stateshe did not notice any significant improvement.  Patient did follow up for the abdominal aortic aneurysm with vascular. They would like to do more test but patient states that his insurance will not pay for it. Patient does not want to do any of the other tests.     .   Past medical history, social, surgical and family history all reviewed in electronic medical record.   Review of Systems: No headache, visual changes, nausea, vomiting, diarrhea, constipation, dizziness, abdominal pain, skin rash, fevers, chills, night sweats, weight loss, swollen lymph nodes, body aches, joint swelling, muscle aches, chest pain, shortness of breath, mood changes.   Objective Blood pressure 118/66, pulse 100, height 6\' 1"  (1.854 m), weight 326 lb (147.873 kg), SpO2 90 %.  General: No apparent distress alert and oriented x3 mood and affect normal, dressed appropriately.  HEENT: Pupils equal, extraocular movements intact  Respiratory: Patient's speak in full sentences and does not appear short of breath  Cardiovascular: 2+ pitting edema lower extremity edema, non tender, no erythema  Skin: Warm dry intact with no signs of infection or rash on extremities or on axial skeleton.  Abdomen: Soft nontender  Neuro: Cranial nerves II through XII are intact, neurovascularly intact in all extremities with 2+ DTRs and 2+ pulses.  Lymph:  No lymphadenopathy of posterior or anterior cervical chain or axillae bilaterally.  Gait normal with good balance and coordination.  MSK:  Non tender with full range of motion and good stability and symmetric strength and tone of shoulders, elbows, wrist, hip, and ankles bilaterally.  Knee:right Significant decrease in pitting edema from previous exam. Palpation reveals that patient is tender to palpation over the medial joint line ROM full in flexion and extension and lower leg rotation. Ligaments with solid consistent endpoints including ACL, PCL, LCL, MCL. Negative Mcmurray's, Apley's, and Thessalonian tests. Non painful patellar compression. Patellar glide With severecrepitus. Patellar and quadriceps tendons unremarkable. Hamstring and quadriceps strength is normal.  No significant change from previous exam Contralateral knee has crepitus as well and minimally tender over the medial joint line.  After informed written and verbal consent, patient was seated on exam table. Right knee was prepped with alcohol swab and utilizing anterolateral approach, patient's right knee space was injected with 16 mg/2.5 mL of Orthovisc (sodium hyaluronate) in a prefilled syringe was injected easily into the knee through a 22-gauge needle..patient also had drainage of 30 mL of strawlike colored fluid. Patient tolerated the procedure well without immediate complications.    Impression and Recommendations:     This case required medical decision making of moderate complexity.

## 2014-09-21 NOTE — Progress Notes (Signed)
Pre visit review using our clinic review tool, if applicable. No additional management support is needed unless otherwise documented below in the visit note. 

## 2014-09-21 NOTE — Assessment & Plan Note (Signed)
Patient was given second of the series of 4 injections of Orthovisc. We discussed icing regimen and home exercises. Patient will continue the brace. Patient come back and see me again in 1 week for third of the series of 4 injections of Orthovisc.

## 2014-09-28 ENCOUNTER — Encounter: Payer: Self-pay | Admitting: Family Medicine

## 2014-09-28 ENCOUNTER — Ambulatory Visit (INDEPENDENT_AMBULATORY_CARE_PROVIDER_SITE_OTHER): Payer: Medicare Other | Admitting: Family Medicine

## 2014-09-28 VITALS — BP 122/66 | HR 96 | Ht 73.0 in | Wt 330.0 lb

## 2014-09-28 DIAGNOSIS — M129 Arthropathy, unspecified: Secondary | ICD-10-CM

## 2014-09-28 DIAGNOSIS — M1711 Unilateral primary osteoarthritis, right knee: Secondary | ICD-10-CM

## 2014-09-28 NOTE — Progress Notes (Signed)
  [name] [place], [place] [address] Phone: [phone] Subjective:    CC: right knee pain follow up  [id number] Eric [name] is a 64 y.o. male coming in with complaint of right knee pain. Patient has been diagnosed with right knee pain and has had surgery of the right knee for a meniscal injury multiple years ago.  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 Patient continued to have difficulty with his right knee. Patient is here for third in the series of Orthovisc injections. Patient states that he is an alert 10-20% better after the last injection. States that overall he is feeling better. Has been doing a lot more walking and is having some more muscle stiffness but significantly less pain in the joint itself.      .   Past medical history, social, surgical and family history all reviewed in electronic medical record.   Review of Systems: No headache, visual changes, nausea, vomiting, diarrhea, constipation, dizziness, abdominal pain, skin rash, fevers, chills, night sweats, weight loss, swollen lymph nodes, body aches, joint swelling, muscle aches, chest pain, shortness of breath, mood changes.   Objective Blood pressure 122/66, pulse 96, height 6\' 1"  (1.854 m), weight 330 lb (149.687 kg), SpO2 91 %.  General: No apparent distress alert and oriented x3 mood and affect normal, dressed appropriately.  HEENT: Pupils equal, extraocular movements intact  Respiratory: Patient's speak in full sentences and does not appear short of breath  Cardiovascular: 2+ pitting edema lower extremity edema, non tender, no erythema  Skin: Warm dry intact with no signs of infection or rash on extremities or on axial skeleton.  Abdomen: Soft nontender  Neuro: Cranial nerves II through XII are intact, neurovascularly intact in all extremities with 2+ DTRs and 2+ pulses.  Lymph: No lymphadenopathy of  posterior or anterior cervical chain or axillae bilaterally.  Gait normal with good balance and coordination.  MSK:  Non tender with full range of motion and good stability and symmetric strength and tone of shoulders, elbows, wrist, hip, and ankles bilaterally.  Knee:right Significant decrease in pitting edema from previous exam. Palpation reveals that patient is tender to palpation over the medial joint line ROM full in flexion and extension and lower leg rotation. Ligaments with solid consistent endpoints including ACL, PCL, LCL, MCL. Negative Mcmurray's, Apley's, and Thessalonian tests. Non painful patellar compression. Patellar glide With severecrepitus. Patellar and quadriceps tendons unremarkable. Hamstring and quadriceps strength is normal.  No significant change from previous exam Contralateral knee has crepitus as well and minimally tender over the medial joint line.  After informed written and verbal consent, patient was seated on exam table. Right knee was prepped with alcohol swab and utilizing anterolateral approach, patient's right knee space was injected with 16 mg/2.5 mL of Orthovisc (sodium hyaluronate) in a prefilled syringe was injected easily into the knee through a 22-gauge needle..patient also had drainage of 25 mL of strawlike colored fluid. Patient tolerated the procedure well without immediate complications.    Impression and Recommendations:     This case required medical decision making of moderate complexity.

## 2014-09-28 NOTE — Progress Notes (Signed)
Pre visit review using our clinic review tool, if applicable. No additional management support is needed unless otherwise documented below in the visit note. 

## 2014-09-28 NOTE — Patient Instructions (Signed)
Good to see you continue the ice Stay active.  Keep up witht the exercises See you again in 1 month or sooner if you want the 4th injection.

## 2014-09-28 NOTE — Assessment & Plan Note (Signed)
Patient has been doing very well and this is his third of for injections on the knee. Patient states he is feeling significantly better that he may not consider doing the fourth injection. He may want to wait. Patient otherwise will follow-up in one month to make sure he continues to improve. Patient continues to have some difficulty the only thing would be a possible knee replacement. Patient though is concerned because of his pulmonary disease if he is even a candidate. Patient will follow-up in one week and continue conservative therapy at this time.

## 2014-10-22 ENCOUNTER — Other Ambulatory Visit: Payer: Self-pay

## 2014-10-22 DIAGNOSIS — R609 Edema, unspecified: Secondary | ICD-10-CM

## 2014-10-22 MED ORDER — HYDROCHLOROTHIAZIDE 25 MG PO TABS: 25.0000 mg | ORAL_TABLET | Freq: Every day | ORAL | Status: DC

## 2014-10-22 NOTE — Telephone Encounter (Signed)
HCTZ refills sent to [facility] per request.

## 2014-10-28 ENCOUNTER — Ambulatory Visit: Payer: Medicare Other | Admitting: Internal Medicine

## 2014-11-23 ENCOUNTER — Telehealth: Payer: Self-pay | Admitting: *Deleted

## 2014-11-23 ENCOUNTER — Other Ambulatory Visit: Payer: Self-pay | Admitting: Family Medicine

## 2014-11-23 NOTE — Telephone Encounter (Signed)
Refill done.  

## 2014-11-23 NOTE — Telephone Encounter (Signed)
Received fax pt requesting refill on his clonazepam 0.5mg  MD is out will hold until he return tomorrow,,,/lmb

## 2014-11-24 MED ORDER — CLONAZEPAM 0.5 MG PO TABS: 0.5000 mg | ORAL_TABLET | Freq: Every day | ORAL | Status: DC

## 2014-11-24 NOTE — Telephone Encounter (Signed)
Done hardcopy to Cherina  

## 2014-11-25 NOTE — Telephone Encounter (Signed)
Faxed script back to pharmacy.../lmb 

## 2015-01-21 ENCOUNTER — Other Ambulatory Visit: Payer: Self-pay

## 2015-01-21 MED ORDER — IRBESARTAN 300 MG PO TABS: 300.0000 mg | ORAL_TABLET | Freq: Every day | ORAL | Status: DC

## 2015-01-21 MED ORDER — SIMVASTATIN 80 MG PO TABS
80.0000 mg | ORAL_TABLET | Freq: Every day | ORAL | Status: DC
Start: 2015-01-21 — End: 2015-03-17

## 2015-01-27 ENCOUNTER — Encounter (HOSPITAL_COMMUNITY): Payer: Self-pay | Admitting: *Deleted

## 2015-01-27 ENCOUNTER — Emergency Department (HOSPITAL_BASED_OUTPATIENT_CLINIC_OR_DEPARTMENT_OTHER)
Admit: 2015-01-27 | Discharge: 2015-01-27 | Disposition: A | Discharge status: Home / Self Care | Payer: Medicare Other | Attending: Family Medicine | Admitting: Family Medicine

## 2015-01-27 ENCOUNTER — Inpatient Hospital Stay (HOSPITAL_COMMUNITY)
Admission: EM | Admit: 2015-01-27 | Discharge: 2015-02-03 | DRG: 603 | Disposition: A | Discharge status: Home Health | Payer: Medicare Other | Attending: Family Medicine | Admitting: Family Medicine

## 2015-01-27 DIAGNOSIS — E785 Hyperlipidemia, unspecified: Secondary | ICD-10-CM | POA: Diagnosis not present

## 2015-01-27 DIAGNOSIS — D696 Thrombocytopenia, unspecified: Secondary | ICD-10-CM | POA: Diagnosis present

## 2015-01-27 DIAGNOSIS — I731 Thromboangiitis obliterans [Buerger's disease]: Secondary | ICD-10-CM | POA: Diagnosis present

## 2015-01-27 DIAGNOSIS — I739 Peripheral vascular disease, unspecified: Secondary | ICD-10-CM | POA: Diagnosis present

## 2015-01-27 DIAGNOSIS — E876 Hypokalemia: Secondary | ICD-10-CM | POA: Diagnosis not present

## 2015-01-27 DIAGNOSIS — E1140 Type 2 diabetes mellitus with diabetic neuropathy, unspecified: Secondary | ICD-10-CM | POA: Diagnosis not present

## 2015-01-27 DIAGNOSIS — Z6841 Body Mass Index (BMI) 40.0 and over, adult: Secondary | ICD-10-CM | POA: Diagnosis not present

## 2015-01-27 DIAGNOSIS — Z825 Family history of asthma and other chronic lower respiratory diseases: Secondary | ICD-10-CM

## 2015-01-27 DIAGNOSIS — Z981 Arthrodesis status: Secondary | ICD-10-CM

## 2015-01-27 DIAGNOSIS — N179 Acute kidney failure, unspecified: Secondary | ICD-10-CM | POA: Diagnosis present

## 2015-01-27 DIAGNOSIS — B9561 Methicillin susceptible Staphylococcus aureus infection as the cause of diseases classified elsewhere: Secondary | ICD-10-CM | POA: Diagnosis present

## 2015-01-27 DIAGNOSIS — I10 Essential (primary) hypertension: Secondary | ICD-10-CM | POA: Diagnosis present

## 2015-01-27 DIAGNOSIS — S80862A Insect bite (nonvenomous), left lower leg, initial encounter: Secondary | ICD-10-CM | POA: Diagnosis present

## 2015-01-27 DIAGNOSIS — I714 Abdominal aortic aneurysm, without rupture: Secondary | ICD-10-CM | POA: Diagnosis present

## 2015-01-27 DIAGNOSIS — F329 Major depressive disorder, single episode, unspecified: Secondary | ICD-10-CM | POA: Diagnosis not present

## 2015-01-27 DIAGNOSIS — N189 Chronic kidney disease, unspecified: Secondary | ICD-10-CM | POA: Diagnosis not present

## 2015-01-27 DIAGNOSIS — F419 Anxiety disorder, unspecified: Secondary | ICD-10-CM | POA: Diagnosis present

## 2015-01-27 DIAGNOSIS — G4733 Obstructive sleep apnea (adult) (pediatric): Secondary | ICD-10-CM | POA: Diagnosis not present

## 2015-01-27 DIAGNOSIS — Z8673 Personal history of transient ischemic attack (TIA), and cerebral infarction without residual deficits: Secondary | ICD-10-CM | POA: Diagnosis not present

## 2015-01-27 DIAGNOSIS — Z79899 Other long term (current) drug therapy: Secondary | ICD-10-CM

## 2015-01-27 DIAGNOSIS — M7989 Other specified soft tissue disorders: Secondary | ICD-10-CM

## 2015-01-27 DIAGNOSIS — I129 Hypertensive chronic kidney disease with stage 1 through stage 4 chronic kidney disease, or unspecified chronic kidney disease: Secondary | ICD-10-CM | POA: Diagnosis not present

## 2015-01-27 DIAGNOSIS — R600 Localized edema: Secondary | ICD-10-CM | POA: Diagnosis present

## 2015-01-27 DIAGNOSIS — L03116 Cellulitis of left lower limb: Secondary | ICD-10-CM | POA: Diagnosis not present

## 2015-01-27 DIAGNOSIS — F17290 Nicotine dependence, other tobacco product, uncomplicated: Secondary | ICD-10-CM | POA: Diagnosis present

## 2015-01-27 DIAGNOSIS — I771 Stricture of artery: Secondary | ICD-10-CM | POA: Diagnosis not present

## 2015-01-27 DIAGNOSIS — W57XXXA Bitten or stung by nonvenomous insect and other nonvenomous arthropods, initial encounter: Secondary | ICD-10-CM | POA: Diagnosis present

## 2015-01-27 DIAGNOSIS — I2510 Atherosclerotic heart disease of native coronary artery without angina pectoris: Secondary | ICD-10-CM | POA: Diagnosis not present

## 2015-01-27 DIAGNOSIS — F32A Depression, unspecified: Secondary | ICD-10-CM | POA: Diagnosis present

## 2015-01-27 DIAGNOSIS — Z7982 Long term (current) use of aspirin: Secondary | ICD-10-CM | POA: Diagnosis not present

## 2015-01-27 DIAGNOSIS — I252 Old myocardial infarction: Secondary | ICD-10-CM

## 2015-01-27 DIAGNOSIS — J449 Chronic obstructive pulmonary disease, unspecified: Secondary | ICD-10-CM | POA: Diagnosis not present

## 2015-01-27 DIAGNOSIS — E1149 Type 2 diabetes mellitus with other diabetic neurological complication: Secondary | ICD-10-CM | POA: Diagnosis present

## 2015-01-27 DIAGNOSIS — I724 Aneurysm of artery of lower extremity: Secondary | ICD-10-CM | POA: Diagnosis present

## 2015-01-27 DIAGNOSIS — Z8249 Family history of ischemic heart disease and other diseases of the circulatory system: Secondary | ICD-10-CM | POA: Diagnosis not present

## 2015-01-27 DIAGNOSIS — Z86718 Personal history of other venous thrombosis and embolism: Secondary | ICD-10-CM | POA: Diagnosis not present

## 2015-01-27 DIAGNOSIS — L0390 Cellulitis, unspecified: Secondary | ICD-10-CM | POA: Diagnosis present

## 2015-01-27 DIAGNOSIS — L02416 Cutaneous abscess of left lower limb: Secondary | ICD-10-CM | POA: Diagnosis present

## 2015-01-27 HISTORY — DX: Adverse effect of unspecified anesthetic, initial encounter: T41.45XA

## 2015-01-27 HISTORY — DX: Cardiac murmur, unspecified: R01.1

## 2015-01-27 HISTORY — DX: Unspecified osteoarthritis, unspecified site: M19.90

## 2015-01-27 HISTORY — DX: Obstructive sleep apnea (adult) (pediatric): G47.33

## 2015-01-27 HISTORY — DX: Low back pain: M54.5

## 2015-01-27 HISTORY — DX: Cerebral infarction, unspecified: I63.9

## 2015-01-27 HISTORY — DX: Heart failure, unspecified: I50.9

## 2015-01-27 HISTORY — DX: Cellulitis of left lower limb: L03.116

## 2015-01-27 HISTORY — DX: Other complications of anesthesia, initial encounter: T88.59XA

## 2015-01-27 HISTORY — DX: Migraine, unspecified, not intractable, without status migrainosus: G43.909

## 2015-01-27 HISTORY — DX: Other chronic pain: G89.29

## 2015-01-27 HISTORY — DX: Low back pain, unspecified: M54.50

## 2015-01-27 LAB — COMPREHENSIVE METABOLIC PANEL
ALT: 13 U/L — ABNORMAL LOW (ref 17–63)
AST: 18 U/L (ref 15–41)
Albumin: 3.6 g/dL (ref 3.5–5.0)
Alkaline Phosphatase: 39 U/L (ref 38–126)
Anion gap: 12 (ref 5–15)
BUN: 33 mg/dL — ABNORMAL HIGH (ref 6–20)
CO2: 27 mmol/L (ref 22–32)
Calcium: 8.9 mg/dL (ref 8.9–10.3)
Chloride: 101 mmol/L (ref 101–111)
Creatinine, Ser: 1.76 mg/dL — ABNORMAL HIGH (ref 0.61–1.24)
GFR calc Af Amer: 45 mL/min — ABNORMAL LOW (ref >60)
GFR calc non Af Amer: 39 mL/min — ABNORMAL LOW (ref >60)
GLUCOSE: 109 mg/dL — AB (ref 65–99)
Potassium: 3.4 mmol/L — ABNORMAL LOW (ref 3.5–5.1)
SODIUM: 140 mmol/L (ref 135–145)
TOTAL PROTEIN: 6.2 g/dL — AB (ref 6.5–8.1)
Total Bilirubin: 0.6 mg/dL (ref 0.3–1.2)

## 2015-01-27 LAB — CBC WITH DIFFERENTIAL/PLATELET
BASOS ABS: 0 10*3/uL (ref 0.0–0.1)
BASOS PCT: 0 % (ref 0–1)
Eosinophils Absolute: 0.1 10*3/uL (ref 0.0–0.7)
Eosinophils Relative: 2 % (ref 0–5)
HEMATOCRIT: 39 % (ref 39.0–52.0)
HEMOGLOBIN: 12.8 g/dL — AB (ref 13.0–17.0)
LYMPHS ABS: 1.2 10*3/uL (ref 0.7–4.0)
Lymphocytes Relative: 15 % (ref 12–46)
MCH: 34 pg (ref 26.0–34.0)
MCHC: 32.8 g/dL (ref 30.0–36.0)
MCV: 103.4 fL — AB (ref 78.0–100.0)
Monocytes Absolute: 0.7 10*3/uL (ref 0.1–1.0)
Monocytes Relative: 8 % (ref 3–12)
Neutro Abs: 6.4 10*3/uL (ref 1.7–7.7)
Neutrophils Relative %: 76 % (ref 43–77)
Platelets: 81 10*3/uL — ABNORMAL LOW (ref 150–400)
RBC: 3.77 MIL/uL — ABNORMAL LOW (ref 4.22–5.81)
RDW: 14.6 % (ref 11.5–15.5)
WBC: 8.4 10*3/uL (ref 4.0–10.5)

## 2015-01-27 LAB — GLUCOSE, CAPILLARY: Glucose-Capillary: 99 mg/dL (ref 65–99)

## 2015-01-27 LAB — PROTIME-INR
INR: 1.04 (ref 0.00–1.49)
Prothrombin Time: 13.8 seconds (ref 11.6–15.2)

## 2015-01-27 MED ORDER — ATORVASTATIN CALCIUM 40 MG PO TABS
40.0000 mg | ORAL_TABLET | Freq: Every day | ORAL | Status: DC
  Administered 2015-01-28 – 2015-02-02 (×6): 40 mg via ORAL
  Filled 2015-01-27 (×6): qty 1

## 2015-01-27 MED ORDER — PIOGLITAZONE HCL 30 MG PO TABS
30.0000 mg | ORAL_TABLET | Freq: Every day | ORAL | Status: DC
  Administered 2015-01-28: 30 mg via ORAL
  Filled 2015-01-27 (×2): qty 1

## 2015-01-27 MED ORDER — HYDROCODONE-ACETAMINOPHEN 5-325 MG PO TABS
2.0000 | ORAL_TABLET | Freq: Once | ORAL | Status: AC
  Administered 2015-01-27: 2 via ORAL
  Filled 2015-01-27: qty 2

## 2015-01-27 MED ORDER — INSULIN ASPART 100 UNIT/ML ~~LOC~~ SOLN: 0.0000 [IU] | Freq: Three times a day (TID) | SUBCUTANEOUS | Status: DC

## 2015-01-27 MED ORDER — CLONAZEPAM 0.5 MG PO TABS
0.5000 mg | ORAL_TABLET | Freq: Every day | ORAL | Status: DC
Start: 2015-01-27 — End: 2015-01-27

## 2015-01-27 MED ORDER — IPRATROPIUM BROMIDE 0.02 % IN SOLN: 0.5000 mg | RESPIRATORY_TRACT | Status: DC | PRN

## 2015-01-27 MED ORDER — FENOFIBRATE 160 MG PO TABS
160.0000 mg | ORAL_TABLET | Freq: Every day | ORAL | Status: DC
  Administered 2015-01-28 – 2015-02-03 (×7): 160 mg via ORAL
  Filled 2015-01-27 (×8): qty 1

## 2015-01-27 MED ORDER — POTASSIUM CHLORIDE CRYS ER 20 MEQ PO TBCR
20.0000 meq | EXTENDED_RELEASE_TABLET | Freq: Once | ORAL | Status: AC
  Administered 2015-01-27: 20 meq via ORAL
  Filled 2015-01-27: qty 1

## 2015-01-27 MED ORDER — CLINDAMYCIN PHOSPHATE 600 MG/50ML IV SOLN
600.0000 mg | Freq: Three times a day (TID) | INTRAVENOUS | Status: DC
  Administered 2015-01-27 – 2015-01-28 (×2): 600 mg via INTRAVENOUS
  Filled 2015-01-27 (×4): qty 50

## 2015-01-27 MED ORDER — LABETALOL HCL 300 MG PO TABS
300.0000 mg | ORAL_TABLET | Freq: Two times a day (BID) | ORAL | Status: DC
  Administered 2015-01-27 – 2015-02-03 (×14): 300 mg via ORAL
  Filled 2015-01-27 (×17): qty 1

## 2015-01-27 MED ORDER — ACETAMINOPHEN 650 MG RE SUPP: 650.0000 mg | Freq: Four times a day (QID) | RECTAL | Status: DC | PRN

## 2015-01-27 MED ORDER — CLONAZEPAM 0.5 MG PO TABS
0.5000 mg | ORAL_TABLET | Freq: Every day | ORAL | Status: DC
  Administered 2015-01-27 – 2015-02-02 (×7): 0.5 mg via ORAL
  Filled 2015-01-27 (×7): qty 1

## 2015-01-27 MED ORDER — GABAPENTIN 100 MG PO CAPS
200.0000 mg | ORAL_CAPSULE | Freq: Every day | ORAL | Status: DC
  Administered 2015-01-27 – 2015-02-02 (×7): 200 mg via ORAL
  Filled 2015-01-27 (×7): qty 2

## 2015-01-27 MED ORDER — INSULIN ASPART 100 UNIT/ML ~~LOC~~ SOLN: 0.0000 [IU] | Freq: Every day | SUBCUTANEOUS | Status: DC

## 2015-01-27 MED ORDER — VANCOMYCIN HCL IN DEXTROSE 1-5 GM/200ML-% IV SOLN
1000.0000 mg | Freq: Once | INTRAVENOUS | Status: AC
  Administered 2015-01-27: 1000 mg via INTRAVENOUS
  Filled 2015-01-27: qty 200

## 2015-01-27 MED ORDER — ASPIRIN EC 81 MG PO TBEC
81.0000 mg | DELAYED_RELEASE_TABLET | Freq: Every day | ORAL | Status: DC
  Administered 2015-01-28 – 2015-01-31 (×4): 81 mg via ORAL
  Filled 2015-01-27 (×8): qty 1

## 2015-01-27 MED ORDER — SODIUM CHLORIDE 0.9 % IV BOLUS (SEPSIS)
1000.0000 mL | Freq: Once | INTRAVENOUS | Status: AC
Start: 2015-01-27 — End: 2015-01-27
  Administered 2015-01-27: 1000 mL via INTRAVENOUS

## 2015-01-27 MED ORDER — ACETAMINOPHEN 325 MG PO TABS
650.0000 mg | ORAL_TABLET | Freq: Four times a day (QID) | ORAL | Status: DC | PRN
  Administered 2015-01-27 – 2015-01-30 (×5): 650 mg via ORAL
  Filled 2015-01-27 (×5): qty 2

## 2015-01-27 MED ORDER — GLIPIZIDE ER 5 MG PO TB24
5.0000 mg | ORAL_TABLET | Freq: Every day | ORAL | Status: DC
  Administered 2015-01-28 – 2015-02-03 (×7): 5 mg via ORAL
  Filled 2015-01-27 (×10): qty 1

## 2015-01-27 NOTE — ED Provider Notes (Signed)
CSN: [id number]     Arrival date & time 01/27/15  1039 History   First MD Initiated Contact with Patient 01/27/15 1047     Chief Complaint  Patient presents with  . Insect Bite     (Consider location/radiation/quality/duration/timing/severity/associated sxs/prior Treatment) Patient is a 64 y.o. male presenting with leg pain. The history is provided by the patient. No language interpreter was used.  Leg Pain Location:  Leg Time since incident:  2 days Injury: no   Leg location:  L leg Pain details:    Quality:  Aching and shooting   Radiates to:  Does not radiate   Severity:  Moderate   Duration:  2 days   Progression:  Worsening Chronicity:  New Dislocation: no   Foreign body present:  No foreign bodies Tetanus status:  Up to date Prior injury to area:  No Relieved by:  Nothing Worsened by:  Nothing tried Associated symptoms: fever   Risk factors: no recent illness   Pt has swelling to left leg,  Pt reports pustule in center that looks like an abscess.  Pt has had a history of cellulitis.  (Pt has an inoperable AAA)   Past Medical History  Diagnosis Date  . ALLERGIC RHINITIS 06/24/2007  . 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 ANXIETY 06/24/2007  . CELLULITIS AND ABSCESS OF LEG EXCEPT FOOT 03/28/2010  . CEREBROVASCULAR ACCIDENT, HX OF 06/24/2007  . COLONIC POLYPS, HX OF 06/24/2007  . COMMON MIGRAINE 06/24/2007  . CONSTIPATION, CHRONIC, HX OF 06/24/2007  . DEPRESSION 06/24/2007  . DIABETES MELLITUS, TYPE II 07/21/2008  . DIVERTICULOSIS, COLON 06/24/2007  . HYPERLIPIDEMIA 06/24/2007  . HYPERTENSION 06/24/2007  . INSOMNIA-SLEEP DISORDER-UNSPEC 01/24/2010  . OBSTRUCTIVE SLEEP APNEA 06/24/2007  . OTHER POSTSURGICAL STATUS OTHER 06/24/2007  . SKIN LESION 01/24/2010  . Unspecified Peripheral Vascular Disease 06/24/2007  . Claudication 06/02/2011  . Coronary artery disease   . COPD (chronic obstructive pulmonary disease)   . DVT (deep venous thrombosis)   . Myocardial infarction    Past Surgical  History  Procedure Laterality Date  . C spine fusion after job related injury  1985  . Bilat knee surgury  1990  . Hand surgury  1999  . Lumbar laminectomy     Family History  Problem Relation Age of Onset  . Cancer Mother     lung cancer  . COPD Father   . Heart disease Father   . Heart disease Maternal Grandmother   . Heart disease Maternal Grandfather   . Heart disease Brother   . Hypertension Brother   . Heart attack Brother   . Peripheral vascular disease Brother    History  Substance Use Topics  . Smoking status: Current Every Day Smoker -- 60 years    Types: Cigars  . Smokeless tobacco: Not on file     Comment: pt states he smokes 5 cigars daily  . Alcohol Use: No    Review of Systems  Constitutional: Positive for fever.  Musculoskeletal: Positive for myalgias and gait problem.  Skin: Positive for wound.  All other systems reviewed and are negative.     Allergies  Oxycodone  Home Medications   Prior to Admission medications   Medication Sig Start Date End Date Taking? Authorizing Provider  aspirin 81 MG tablet Take 81 mg by mouth daily.     Yes Historical Provider, MD  Bisacodyl (LAXATIVE PO) Take 1 capsule by mouth daily.   Yes Historical Provider, MD  Blood Glucose Monitoring Suppl DEVI 1  Device by Does not apply route daily. 09/12/13  Yes Biagio Borg, MD  clonazePAM (KLONOPIN) 0.5 MG tablet Take 1-2 tablets (0.5-1 mg total) by mouth at bedtime. Pt can take up to 2 depending on anxiety level 11/24/14  Yes Biagio Borg, MD  fenofibrate 160 MG tablet Take 1 tablet (160 mg total) by mouth daily. 05/29/14  Yes Biagio Borg, MD  furosemide (LASIX) 20 MG tablet Take 0.5 tablets (10 mg total) by mouth daily. 06/29/14  Yes Lyndal Pulley, DO  gabapentin (NEURONTIN) 100 MG capsule Take 2 capsules (200 mg total) by mouth at bedtime. 11/23/14  Yes Lyndal Pulley, DO  glipiZIDE (GLUCOTROL XL) 5 MG 24 hr tablet Take 1 tablet (5 mg total) by mouth daily with breakfast.  05/29/14  Yes Biagio Borg, MD  glucose blood test strip Use as instructed 06/19/13  Yes Biagio Borg, MD  hydrochlorothiazide (HYDRODIURIL) 25 MG tablet Take 1 tablet (25 mg total) by mouth daily. 10/22/14  Yes Biagio Borg, MD  irbesartan (AVAPRO) 300 MG tablet Take 1 tablet (300 mg total) by mouth daily. 01/21/15  Yes Biagio Borg, MD  labetalol (NORMODYNE) 300 MG tablet Take 1 tablet (300 mg total) by mouth 2 (two) times daily. 05/29/14  Yes Biagio Borg, MD  Lancets (ACCU-CHEK SOFT TOUCH) lancets Use as instructed 09/12/13  Yes Biagio Borg, MD  metFORMIN (GLUCOPHAGE) 1000 MG tablet Take 1 tablet (1,000 mg total) by mouth 2 (two) times daily with a meal. 05/29/14  Yes Biagio Borg, MD  pioglitazone (ACTOS) 30 MG tablet Take 1 tablet (30 mg total) by mouth daily. 05/29/14  Yes Biagio Borg, MD  simvastatin (ZOCOR) 80 MG tablet Take 1 tablet (80 mg total) by mouth at bedtime. 01/21/15  Yes Biagio Borg, MD  Diclofenac Sodium 2 % SOLN Apply 1 pump twice daily. Patient not taking: Reported on 01/27/2015 07/22/14   Lyndal Pulley, DO  diphenhydrAMINE (BENADRYL) 25 MG tablet Take 1 tablet (25 mg total) by mouth every 6 (six) hours. Patient not taking: Reported on 01/27/2015 12/23/12   Linton Flemings, MD  famotidine (PEPCID) 20 MG tablet Take 1 tablet (20 mg total) by mouth 2 (two) times daily. Patient not taking: Reported on 01/27/2015 12/23/12   Linton Flemings, MD  Fluticasone-Salmeterol (ADVAIR DISKUS) 250-50 MCG/DOSE AEPB Inhale 1 puff into the lungs 2 (two) times daily. Patient not taking: Reported on 01/27/2015 01/27/13   Biagio Borg, MD  linagliptin (TRADJENTA) 5 MG TABS tablet Take 1 tablet (5 mg total) by mouth daily. Patient not taking: Reported on 01/27/2015 12/26/12   Biagio Borg, MD   BP 114/45 mmHg  Pulse 89  Temp(Src) 98.2 F (36.8 C) (Oral)  Resp 20  Ht 6' (1.829 m)  Wt 316 lb (143.337 kg)  BMI 42.85 kg/m2  SpO2 87% Physical Exam  Constitutional: He is oriented to person, place, and time. He appears  well-developed and well-nourished.  HENT:  Head: Normocephalic and atraumatic.  Eyes: EOM are normal. Pupils are equal, round, and reactive to light.  Neck: Normal range of motion.  Cardiovascular: Normal rate.   Pulmonary/Chest: Effort normal.  Abdominal: Soft. He exhibits no distension.  Musculoskeletal: He exhibits tenderness.  1cm blistered area,  (no pus, only blood when punctured)  Neurological: He is alert and oriented to person, place, and time.  Skin: Skin is warm.  Psychiatric: He has a normal mood and affect.  Nursing note and vitals  reviewed.   ED Course  Procedures (including critical care time) Labs Review Labs Reviewed  CBC WITH DIFFERENTIAL/PLATELET - Abnormal; Notable for the following:    RBC 3.77 (*)    Hemoglobin 12.8 (*)    MCV 103.4 (*)    Platelets 81 (*)    All other components within normal limits  COMPREHENSIVE METABOLIC PANEL - Abnormal; Notable for the following:    Potassium 3.4 (*)    Glucose, Bld 109 (*)    BUN 33 (*)    Creatinine, Ser 1.76 (*)    Total Protein 6.2 (*)    ALT 13 (*)    GFR calc non Af Amer 39 (*)    GFR calc Af Amer 45 (*)    All other components within normal limits  CULTURE, BLOOD (ROUTINE X 2)  CULTURE, BLOOD (ROUTINE X 2)  WOUND CULTURE  PROTIME-INR    Imaging Review No results found.   EKG Interpretation None      MDM  No dvt.   Pt reports he is on a blood thinner but does not have one listed on medication list.    Final diagnoses:  None    Hospitalist consult    Fransico Meadow, PA-C 01/27/15 [place], DO 01/30/15 1155

## 2015-01-27 NOTE — Progress Notes (Signed)
Pt admitted to 6N17 via stretcher from ED.  Pt AAO X4.  Pt on RA.  Pt has 20G Rt AC SL.  Report rcvd from Eme.  Family member to bedside.  Pt wants to know when he will see the doctor because he wants them on the same page.  He wants to know if he can leave the floor to smoke.  Told pt about nicotine patch and he said no, because it causes heart attack.  Also inquiring about his meal tray.  Tray was ordered in ED and supposed to be delivered to floor.  Will page MD.

## 2015-01-27 NOTE — H&P (Signed)
[facility] History and Physical  Eric [name] ERX:[id number] DOB: [birth year] DOA: 01/27/2015  Referring physician: ER physician PCP: Cathlean Cower, MD  Chief Complaint: left leg swelling, redness, pain   HPI:  64 year old male with past medical history of obesity, OSA, COPD, dyslipidemia, diabetes, neuropathy who presented to [facility] ED with progressive swelling and redness in left lower extremity over past 2 days prior to this admission. Pt reported he feels he had some type of bite but does not recall having actual insect bite. He says his legs at baseline are swollen but this is definitely worse than baseline. He has a small area in the back of the leg along the calf muscle that looks like it healed /crusted but no open or draining wounds visible at this time. No fevers or chills. No difficulty ambulating. No other reports such as chest pain, shortness of breath or palpitations. No abdominal pain, nausea or vomiting. No diarrhea or constipation. No GU complaints.  In ED, pt was hemodynamically stable. Blood work revealed platelets of 81 (seems to be chronic since his platelets were in 90's range about 8 months ago). His creatinine was 1.76 (new compared with previous creatinine values). He was given 1 dose of vancomycin. He was admitted for treatment of left lower extremity cellulitis. LE venous doppler was negative for acute DVT.   Assessment & Plan    Principal Problem:   Cellulitis of left leg - Pt was given 1 dose of vanco in ED but based on cellulitis order set ans if patient's cellulitis is classified as moderate without purulent drainage, clinda was abx choice so order placed for clinda 600 mg IV every 8 hours. - Continue supportive care with analgesia as needed - Keep leg elevated  Active Problems:   Obstructive sleep apnea / COPD (chronic obstructive pulmonary disease) / Acute on chronic respiratory failure with hypoxia - On admission, pt had slight hypoxia, O2 sat 87% but it has  improved with [place] oxygen support - Stable respiratory status      Smoking - Pt did not want nicotine patch - Counseled on cessation     Essential hypertension - Held avapro, lasix and hctz due to initial low BP - BP now in 130's range so we will only resume labetalol     Bilateral lower extremity edema - Lasix on hold due to renal insufficiency  - Revaluate in am if renal function better so that lasix can be restarted     Anxiety and depression - Continue clonazepam,    Dyslipidemia - Resumed statin therapy and fenofibrate     Diabetes with neurologic complications - Last P5K in 05/2014 7.6  - Check A1c on this admission - Resumed glipizide, actos - Held metformin due to acute infection - Started SSI as well - Continue gabapentin for neuropathy    Thrombocytopenia - Chronic - Platelet count about 8 months ago in 90's range - Use SCD's for DVT prophylaxis - No reports of bleeding     Hypokalemia - From lasix and hctz - Supplemented    Acute renal failure - Possibly from lasix, Avapro, Hctz - All those meds were put on hold which anyway was held due to patient's hypotension on admission, 91/59.   DVT prophylaxis:  - SCD's bilaterally    Radiological Exams on Admission: No results found.   Code Status: Full Family Communication: Plan of care discussed with the patient and his wife at the bedisde Disposition Plan: Admit for further evaluation, medical  floor  Leisa Lenz, MD  [facility] Pager [phone]  Time spent in minutes: 75 minutes  Review of Systems:  Constitutional: Negative for fever, chills and malaise/fatigue. Negative for diaphoresis.  HENT: Negative for hearing loss, ear pain, nosebleeds, congestion, sore throat, neck pain, tinnitus and ear discharge.   Eyes: Negative for blurred vision, double vision, photophobia, pain, discharge and redness.  Respiratory: Negative for cough, hemoptysis, sputum production, shortness of breath, wheezing  and stridor.   Cardiovascular: Negative for chest pain, palpitations, orthopnea, claudication and leg swelling.  Gastrointestinal: Negative for nausea, vomiting and abdominal pain. Negative for heartburn, constipation, blood in stool and melena.  Genitourinary: Negative for dysuria, urgency, frequency, hematuria and flank pain.  Musculoskeletal: Negative for myalgias, back pain, joint pain and falls.  Skin: Negative for itching and rash. Left leg redness. Neurological: Negative for dizziness and weakness. Negative for tingling, tremors, sensory change, speech change, focal weakness, loss of consciousness and headaches.  Endo/Heme/Allergies: Negative for environmental allergies and polydipsia. Does not bruise/bleed easily.  Psychiatric/Behavioral: Negative for suicidal ideas. The patient is not nervous/anxious.      Past Medical History  Diagnosis Date  . ALLERGIC RHINITIS 06/24/2007  . 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 ANXIETY 06/24/2007  . CELLULITIS AND ABSCESS OF LEG EXCEPT FOOT 03/28/2010  . CEREBROVASCULAR ACCIDENT, HX OF 06/24/2007  . COLONIC POLYPS, HX OF 06/24/2007  . COMMON MIGRAINE 06/24/2007  . CONSTIPATION, CHRONIC, HX OF 06/24/2007  . DEPRESSION 06/24/2007  . DIABETES MELLITUS, TYPE II 07/21/2008  . DIVERTICULOSIS, COLON 06/24/2007  . HYPERLIPIDEMIA 06/24/2007  . HYPERTENSION 06/24/2007  . INSOMNIA-SLEEP DISORDER-UNSPEC 01/24/2010  . OBSTRUCTIVE SLEEP APNEA 06/24/2007  . OTHER POSTSURGICAL STATUS OTHER 06/24/2007  . SKIN LESION 01/24/2010  . Unspecified Peripheral Vascular Disease 06/24/2007  . Claudication 06/02/2011  . Coronary artery disease   . COPD (chronic obstructive pulmonary disease)   . DVT (deep venous thrombosis)   . Myocardial infarction    Past Surgical History  Procedure Laterality Date  . C spine fusion after job related injury  1985  . Bilat knee surgury  1990  . Hand surgury  1999  . Lumbar laminectomy     Social History:  reports that he has been smoking Cigars.  He  does not have any smokeless tobacco history on file. He reports that he does not drink alcohol or use illicit drugs.  Allergies  Allergen Reactions  . Oxycodone Other (See Comments)    Sedation/somnolence    Family History:  Family History  Problem Relation Age of Onset  . Cancer Mother     lung cancer  . COPD Father   . Heart disease Father   . Heart disease Maternal Grandmother   . Heart disease Maternal Grandfather   . Heart disease Brother   . Hypertension Brother   . Heart attack Brother   . Peripheral vascular disease Brother      Prior to Admission medications   Medication Sig Start Date End Date Taking? Authorizing Provider  aspirin 81 MG tablet Take 81 mg by mouth daily.     Yes Historical Provider, MD  clonazePAM (KLONOPIN) 0.5 MG tablet Take 1-2 tablets (0.5-1 mg total) by mouth at bedtime. 11/24/14  Yes Biagio Borg, MD  fenofibrate 160 MG tablet Take 1 tablet (160 mg total) by mouth daily. 05/29/14  Yes Biagio Borg, MD  furosemide (LASIX) 20 MG tablet Take 0.5 tablets (10 mg total) by mouth daily. 06/29/14  Yes Lyndal Pulley, DO  gabapentin (  NEURONTIN) 100 MG capsule Take 2 capsules (200 mg total) by mouth at bedtime. 11/23/14  Yes Lyndal Pulley, DO  glipiZIDE (GLUCOTROL XL) 5 MG 24 hr tablet Take 1 tablet (5 mg total) by mouth daily with breakfast. 05/29/14  Yes Biagio Borg, MD  hydrochlorothiazide (HYDRODIURIL) 25 MG tablet Take 1 tablet (25 mg total) by mouth daily. 10/22/14  Yes Biagio Borg, MD  irbesartan (AVAPRO) 300 MG tablet Take 1 tablet (300 mg total) by mouth daily. 01/21/15  Yes Biagio Borg, MD  labetalol (NORMODYNE) 300 MG tablet Take 1 tablet (300 mg total) by mouth 2 (two) times daily. 05/29/14  Yes Biagio Borg, MD  metFORMIN (GLUCOPHAGE) 1000 MG tablet Take 1 tablet (1,000 mg total) by mouth 2 (two) times daily with a meal. 05/29/14  Yes Biagio Borg, MD  pioglitazone (ACTOS) 30 MG tablet Take 1 tablet (30 mg total) by mouth daily. 05/29/14  Yes Biagio Borg, MD  simvastatin (ZOCOR) 80 MG tablet Take 1 tablet (80 mg total) by mouth at bedtime. 01/21/15  Yes Biagio Borg, MD   Physical Exam: Filed Vitals:   01/27/15 1415 01/27/15 1656 01/27/15 1703 01/27/15 1847  BP: 124/58   139/58  Pulse: 84 98 98 100  Temp:    98.3 F (36.8 C)  TempSrc:    Oral  Resp: '20 21 25 21  '$ Height:      Weight:    143.337 kg (316 lb)  SpO2: 92% 93% 88% 96%    Physical Exam  Constitutional: Appears well-developed and well-nourished. No distress.  HENT: Normocephalic. No tonsillar erythema or exudates Eyes: Conjunctivae are normal. No scleral icterus.  Neck: Normal ROM. Neck supple. No JVD. No tracheal deviation. No thyromegaly.  CVS: RRR, S1/S2 +, no murmurs, no gallops, no carotid bruit.  Pulmonary: Effort and breath sounds normal, no stridor, rhonchi, wheezes, rales.  Abdominal: Soft. BS +,  no distension, tenderness, rebound or guarding.  Musculoskeletal: Normal range of motion. LLE edema and erythema left more than right and (+) tenderness LLE.  Lymphadenopathy: No lymphadenopathy noted, cervical, inguinal. Neuro: Alert. Normal reflexes, muscle tone coordination. No focal neurologic deficits. Skin: Skin is warm and dry. Left lower extremity redness.  Psychiatric: Normal mood and affect. Behavior, judgment, thought content normal.   Labs on Admission:  Basic Metabolic Panel:  Recent Labs Lab 01/27/15 1118  NA 140  K 3.4*  CL 101  CO2 27  GLUCOSE 109*  BUN 33*  CREATININE 1.76*  CALCIUM 8.9   Liver Function Tests:  Recent Labs Lab 01/27/15 1118  AST 18  ALT 13*  ALKPHOS 39  BILITOT 0.6  PROT 6.2*  ALBUMIN 3.6   No results for input(s): LIPASE, AMYLASE in the last 168 hours. No results for input(s): AMMONIA in the last 168 hours. CBC:  Recent Labs Lab 01/27/15 1118  WBC 8.4  NEUTROABS 6.4  HGB 12.8*  HCT 39.0  MCV 103.4*  PLT 81*   Cardiac Enzymes: No results for input(s): CKTOTAL, CKMB, CKMBINDEX, TROPONINI in the last  168 hours. BNP: Invalid input(s): POCBNP CBG: No results for input(s): GLUCAP in the last 168 hours.  If 7PM-7AM, please contact night-coverage [email or web address] Password [email or web address] 01/27/2015, 7:04 PM

## 2015-01-27 NOTE — ED Notes (Signed)
Patient transported to vascular. 

## 2015-01-27 NOTE — Progress Notes (Signed)
Left lower extremity venous duplex completed.  Left:  No obvious evidence of DVT, superficial thrombosis, or Baker's cyst.  Right:  Negative for DVT in the common femoral vein.  Technically difficult study due to the patient's body habitus.

## 2015-01-27 NOTE — ED Notes (Signed)
Pt reports bit to left calf area 2 days ago. Area red, swollen, warm to touch. Reports increase in shortness of breath since bite. Unsure what bit him, thinks it was an ant.

## 2015-01-27 NOTE — ED Notes (Signed)
Pt has carb modified diet per admitting MD- pt given coffee.

## 2015-01-28 DIAGNOSIS — L03116 Cellulitis of left lower limb: Principal | ICD-10-CM

## 2015-01-28 LAB — GLUCOSE, CAPILLARY
GLUCOSE-CAPILLARY: 156 mg/dL — AB (ref 65–99)
Glucose-Capillary: 100 mg/dL — ABNORMAL HIGH (ref 65–99)
Glucose-Capillary: 91 mg/dL (ref 65–99)
Glucose-Capillary: 82 mg/dL (ref 65–99)

## 2015-01-28 LAB — HEMOGLOBIN A1C
HEMOGLOBIN A1C: 6.2 % — AB (ref 4.8–5.6)
Mean Plasma Glucose: 131 mg/dL

## 2015-01-28 MED ORDER — VANCOMYCIN HCL 10 G IV SOLR
1750.0000 mg | INTRAVENOUS | Status: DC
  Administered 2015-01-29 – 2015-01-30 (×2): 1750 mg via INTRAVENOUS
  Filled 2015-01-28 (×3): qty 1750

## 2015-01-28 MED ORDER — HYDROCHLOROTHIAZIDE 25 MG PO TABS: 25.0000 mg | ORAL_TABLET | Freq: Every day | ORAL | Status: DC

## 2015-01-28 MED ORDER — FAMOTIDINE 20 MG PO TABS
20.0000 mg | ORAL_TABLET | Freq: Two times a day (BID) | ORAL | Status: DC
  Administered 2015-01-28 – 2015-02-03 (×13): 20 mg via ORAL
  Filled 2015-01-28 (×13): qty 1

## 2015-01-28 MED ORDER — LINAGLIPTIN 5 MG PO TABS
5.0000 mg | ORAL_TABLET | Freq: Every day | ORAL | Status: DC
  Administered 2015-01-28 – 2015-02-03 (×7): 5 mg via ORAL
  Filled 2015-01-28 (×7): qty 1

## 2015-01-28 MED ORDER — IRBESARTAN 300 MG PO TABS: 300.0000 mg | ORAL_TABLET | Freq: Every day | ORAL | Status: DC

## 2015-01-28 MED ORDER — MOMETASONE FURO-FORMOTEROL FUM 100-5 MCG/ACT IN AERO
2.0000 | INHALATION_SPRAY | Freq: Two times a day (BID) | RESPIRATORY_TRACT | Status: DC
  Filled 2015-01-28 (×2): qty 8.8

## 2015-01-28 MED ORDER — VANCOMYCIN HCL 10 G IV SOLR
2000.0000 mg | Freq: Once | INTRAVENOUS | Status: AC
  Administered 2015-01-28: 2000 mg via INTRAVENOUS
  Filled 2015-01-28: qty 2000

## 2015-01-28 MED ORDER — METFORMIN HCL 500 MG PO TABS: 1000.0000 mg | ORAL_TABLET | Freq: Two times a day (BID) | ORAL | Status: DC

## 2015-01-28 MED ORDER — CLINDAMYCIN HCL 300 MG PO CAPS
600.0000 mg | ORAL_CAPSULE | Freq: Three times a day (TID) | ORAL | Status: DC
  Administered 2015-01-28: 600 mg via ORAL
  Filled 2015-01-28: qty 2

## 2015-01-28 NOTE — Progress Notes (Signed)
CRITICAL VALUE ALERT  Critical value received: + blood culture  Date of notification: 01/28/15  Time of notification:  14:05  Critical value read back: yes  Nurse who received alert: Derald Macleod  MD notified (1st page):  Dr.Samtani, J  Time of first page:  14:08  MD notified (2nd page): na  Time of second page: na  Responding MD:  na  Time MD responded: na

## 2015-01-28 NOTE — Progress Notes (Signed)
ANTIBIOTIC CONSULT NOTE - INITIAL  Pharmacy Consult:  Vancomycin Indication:  Bacteremia  Allergies  Allergen Reactions  . Oxycodone Other (See Comments)    Sedation/somnolence    Patient Measurements: Height: 6' (182.9 cm) Weight: (!) 316 lb 12.8 oz (143.7 kg) IBW/kg (Calculated) : 77.6  Vital Signs: Temp: 98.8 F (37.1 C) (06/09 1400) Temp Source: Oral (06/09 1400) BP: 117/57 mmHg (06/09 1400) Pulse Rate: 77 (06/09 1400) Intake/Output from previous day: 06/08 0701 - 06/09 0700 In: 1580 [P.O.:480; I.V.:1000; IV Piggyback:100] Out: 275 [Urine:275]  Labs:  Recent Labs  01/27/15 1118  WBC 8.4  HGB 12.8*  PLT 81*  CREATININE 1.76*   Estimated Creatinine Clearance: 62.4 mL/min (by C-G formula based on Cr of 1.76). No results for input(s): VANCOTROUGH, VANCOPEAK, VANCORANDOM, GENTTROUGH, GENTPEAK, GENTRANDOM, TOBRATROUGH, TOBRAPEAK, TOBRARND, AMIKACINPEAK, AMIKACINTROU, AMIKACIN in the last 72 hours.   Microbiology: Recent Results (from the past 720 hour(s))  Wound culture     Status: None (Preliminary result)   Collection Time: 01/27/15 11:22 AM  Result Value Ref Range Status   Specimen Description WOUND LEFT LEG  Final   Special Requests Normal  Final   Gram Stain   Final    NO WBC SEEN NO SQUAMOUS EPITHELIAL CELLS SEEN NO ORGANISMS SEEN Performed at [facility]    Culture   Final    NO GROWTH 1 DAY Performed at [facility]    Report Status PENDING  Incomplete  Culture, blood (routine x 2)     Status: None (Preliminary result)   Collection Time: 01/27/15 11:45 AM  Result Value Ref Range Status   Specimen Description BLOOD LEFT ASSIST CONTROL  Final   Special Requests BAA 5CC  Final   Culture   Final    GRAM POSITIVE COCCI IN CLUSTERS Note: Gram Stain Report Called to,Read Back By and Verified With: Derald Macleod @ 1404 ON [id number] BY [facility] Performed at [facility]    Report Status PENDING  Incomplete  Culture, blood (routine x 2)      Status: None (Preliminary result)   Collection Time: 01/27/15 11:50 AM  Result Value Ref Range Status   Specimen Description BLOOD RIGHT FOREARM  Final   Special Requests BAA 5CC   Final   Culture   Final           BLOOD CULTURE RECEIVED NO GROWTH TO DATE CULTURE WILL BE HELD FOR 5 DAYS BEFORE ISSUING A FINAL NEGATIVE REPORT Performed at [facility]    Report Status PENDING  Incomplete    Medical History: Past Medical History  Diagnosis Date  . ALLERGIC RHINITIS 06/24/2007  . 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 ANXIETY 06/24/2007  . CELLULITIS AND ABSCESS OF LEG EXCEPT FOOT 03/28/2010  . COLONIC POLYPS, HX OF 06/24/2007  . CONSTIPATION, CHRONIC, HX OF 06/24/2007  . DEPRESSION 06/24/2007  . DIVERTICULOSIS, COLON 06/24/2007  . HYPERLIPIDEMIA 06/24/2007  . HYPERTENSION 06/24/2007  . INSOMNIA-SLEEP DISORDER-UNSPEC 01/24/2010  . SKIN LESION 01/24/2010  . Unspecified Peripheral Vascular Disease 06/24/2007  . Claudication 06/02/2011  . Coronary artery disease   . COPD (chronic obstructive pulmonary disease)   . DVT (deep venous thrombosis)   . Complication of anesthesia     "they can't put me asleep cause I don't wake up" (01/27/2015)  . OSA (obstructive sleep apnea)   . Heart murmur   . CHF (congestive heart failure)   . Myocardial infarction 1990's X 1    "w/stroke"  . CVA (cerebral vascular  accident) 1990's    "w/MI"; denies residual on 01/27/2015  . DIABETES MELLITUS, TYPE II dx'd 2015  . Migraine     "comes over my right eye q now and then" (01/27/2015)  . Arthritis     "hands, knees, hips, neck; q part of the body" (01/27/2015)  . Chronic lower back pain     "I have a steel rod in my back"  . Cellulitis of left leg 01/27/2015      Assessment: 72 YOM presented with left leg swelling, redness and pain s/p insect bite.  Now with GPC bacteremia and Pharmacy consulted to transition antibiotic to vancomycin.  Patient has AKI.  Vanc 6/9 >> Clinda 6/8 >> 6/9  6/8 BCx x2 - GPC 6/8 left  leg wound cx -   Goal of Therapy:  Vancomycin trough level 15-20 mcg/ml   Plan:  - Vanc 2gm IV x 1, then '1750mg'$  IV Q24H - Monitor renal fxn, clinical course, vanc trough at Css - Would patient benefit from doxycycline? - BMET in AM    Finlee Concepcion D. Mina Marble, PharmD, BCPS Pager:  [phone] 01/28/2015, 7:36 PM

## 2015-01-28 NOTE — Care Management Note (Signed)
Case Management Note  Patient Details  Name: Eric [name] MRN: [id number] Date of Birth: 01/01/1951  Subjective/Objective:   Pt admitted on 01/27/15 with cellulitis of Lt leg.  PTA, pt resided at home with spouse.                  Action/Plan: Will follow for dc needs as pt progresses.    Expected Discharge Date:                  Expected Discharge Plan:     In-House Referral:     Discharge planning Services   CM Referral   Post Acute Care Choice:    Choice offered to:     DME Arranged:    DME Agency:     HH Arranged:    [place] Agency:     Status of Service:   in process, with continue to follow   Medicare Important Message Given:    Date Medicare IM Given:    Medicare IM give by:    Date Additional Medicare IM Given:    Additional Medicare Important Message give by:     If discussed at [place] of Stay Meetings, dates discussed:    Additional Comments:  Reinaldo Raddle, RN, BSN  Trauma/Neuro ICU Case Manager [phone]

## 2015-01-28 NOTE — Progress Notes (Signed)
Eric [name] DGL:[id number] DOB: 1951-06-22 DOA: 01/27/2015 PCP: Eric Cower, MD  Brief narrative: 64 y/o ? 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 Bridgett Larsson of [facility]--Buerger's disease and poor surgical candidate admitted 01/27/15 c LE cellulitis and also incidental AKI/CKD He developed LLE swelling after a bite to his LLE ~1 week pta-refused to be seen by MD.  Since the pain got so bad he decided to come to [facility] ED    Past medical history-As per Problem list Chart reviewed as below-   Consultants:    Procedures:    Antibiotics:  IV Vanc 6/8  IV clinda 6/8-->6/9  PO clinda 6/9-->   Subjective  Disgruntled and wants to snmoke Wants to leave No CP NV Doesn't wish to trial nicotine replacement patch  Doesn't wish nicotine replacement gum   Objective    Interim History:   Telemetry:    Objective: Filed Vitals:   01/27/15 1703 01/27/15 1847 01/27/15 2114 01/28/15 0609  BP:  139/58 136/55 119/54  Pulse: 98 100 96 82  Temp:  98.3 F (36.8 C) 98.4 F (36.9 C) 98.1 F (36.7 C)  TempSrc:  Oral Oral   Resp: '25 21 20 19  '$ Height:      Weight:  143.337 kg (316 lb)  143.7 kg (316 lb 12.8 oz)  SpO2: 88% 96% 95% 94%    Intake/Output Summary (Last 24 hours) at 01/28/15 1302 Last data filed at 01/28/15 5188  Gross per 24 hour  Intake   1820 ml  Output    275 ml  Net   1545 ml    Exam:  General: Morbid obesity, Body mass index is 42.96 kg/(m^2). Thick Neck, cannot app JVD Cardiovascular: S1-S2 no murmur rub or gallop Respiratory: Clinically clear no added sound no rales no rhonchi Abdomen: Soft nontender nondistended but obese Skin no lower extremity edema Neuro intact  Data Reviewed: Basic Metabolic Panel:  Recent Labs Lab 01/27/15 1118  NA 140  K 3.4*  CL 101  CO2 27  GLUCOSE 109*  BUN 33*  CREATININE 1.76*  CALCIUM 8.9   Liver Function Tests:  Recent Labs Lab  01/27/15 1118  AST 18  ALT 13*  ALKPHOS 39  BILITOT 0.6  PROT 6.2*  ALBUMIN 3.6   No results for input(s): LIPASE, AMYLASE in the last 168 hours. No results for input(s): AMMONIA in the last 168 hours. CBC:  Recent Labs Lab 01/27/15 1118  WBC 8.4  NEUTROABS 6.4  HGB 12.8*  HCT 39.0  MCV 103.4*  PLT 81*   Cardiac Enzymes: No results for input(s): CKTOTAL, CKMB, CKMBINDEX, TROPONINI in the last 168 hours. BNP: Invalid input(s): POCBNP CBG:  Recent Labs Lab 01/27/15 2200 01/28/15 0746 01/28/15 1119  GLUCAP 99 100* 156*    Recent Results (from the past 240 hour(s))  Wound culture     Status: None (Preliminary result)   Collection Time: 01/27/15 11:22 AM  Result Value Ref Range Status   Specimen Description WOUND LEFT LEG  Final   Special Requests Normal  Final   Gram Stain   Final    NO WBC SEEN NO SQUAMOUS EPITHELIAL CELLS SEEN NO ORGANISMS SEEN Performed at [facility]    Culture   Final    NO GROWTH 1 DAY Performed at [facility]    Report Status PENDING  Incomplete  Culture, blood (routine x 2)  Status: None (Preliminary result)   Collection Time: 01/27/15 11:45 AM  Result Value Ref Range Status   Specimen Description BLOOD LEFT ASSIST CONTROL  Final   Special Requests BAA 5CC  Final   Culture   Final           BLOOD CULTURE RECEIVED NO GROWTH TO DATE CULTURE WILL BE HELD FOR 5 DAYS BEFORE ISSUING A FINAL NEGATIVE REPORT Performed at [facility]    Report Status PENDING  Incomplete  Culture, blood (routine x 2)     Status: None (Preliminary result)   Collection Time: 01/27/15 11:50 AM  Result Value Ref Range Status   Specimen Description BLOOD RIGHT FOREARM  Final   Special Requests BAA 5CC   Final   Culture   Final           BLOOD CULTURE RECEIVED NO GROWTH TO DATE CULTURE WILL BE HELD FOR 5 DAYS BEFORE ISSUING A FINAL NEGATIVE REPORT Performed at [facility]    Report Status PENDING  Incomplete      Studies:              All Imaging reviewed and is as per above notation   Scheduled Meds: . aspirin EC  81 mg Oral Daily  . atorvastatin  40 mg Oral q1800  . clindamycin (CLEOCIN) IV  600 mg Intravenous 3 times per day  . clonazePAM  0.5 mg Oral QHS  . famotidine  20 mg Oral BID  . fenofibrate  160 mg Oral Daily  . gabapentin  200 mg Oral QHS  . glipiZIDE  5 mg Oral Q breakfast  . insulin aspart  0-15 Units Subcutaneous TID WC  . insulin aspart  0-5 Units Subcutaneous QHS  . labetalol  300 mg Oral BID  . linagliptin  5 mg Oral Daily  . metFORMIN  1,000 mg Oral BID WC  . mometasone-formoterol  2 puff Inhalation BID  . pioglitazone  30 mg Oral Daily   Continuous Infusions:    Assessment/Plan: 1. Acute nonpurulent cellulitis of lower extremity in the setting of Buerger's disease-transitioned from IV clindamycin to by mouth clindamycin. Marked out the area. CBC plus differential a.m. 2. Tobacco abuse-patient insistent on not needing tobacco patch "that gave me a heart attack". I have explained to the patient clearly he cannot smoke. He understands. 3. Mood disorder not otherwise specified-patient has an aggressive attitude and disgruntled affect-the wife tells me that this is chronic. Continue clonazepam 0.5 twice a day. 4. 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 This is interesting as patient is on glipizide 5 mg, linagliptin 5 mg, performed 1000 twice a day at home--he will need to hold off on his metformin because of his marginal renal function for now. His A1c on this admission was reasonably controlled at 6.2 He is refusing insulin and CBG checks and I've okayed the use of glipizide and limited grip 10 cautiously as these are renally cleared as well-continue gabapentin cautiously 200 daily at bedtime 5. Acute renal insufficiency-repeat complete metabolic panel in a.m. 6. Nonoperative  severe lower extremity claudication-patient does not have any ulceration on his lower extremities however is followed by vascular surgery Dr. Vallarie Mare for AAA-he has poor insight into causality of this i.e. smoking. He does not wish to quit smoking. He will be followed up as an outpatient for this 7. Stage II Gold COPD-continue Atrovent 0.5 every 4  when necessary, Dulera 100-5 daily 8. Hyperlipidemia-continue Lipitor 40 daily, fenofibrate 160 daily 9. Hypertension continue labetalol 300 twice a day-this medication has no evidence and probably should be substituted as an outpatient for metoprolol or Coreg  Code Status: Full Family Communication: Long discussion with patient and wife. Patient does not seem happy about the prospect of staying another day. Have explained to him clearly that within my clinical judgment that he needs at least 1 more day to see how his leg looks and then we can transition him home. He understands clearly that he can leave [place] and the risks that that involves Disposition Plan: Inpatient pending resolution   Verneita Griffes, MD  [facility] Pager [phone] 01/28/2015, 1:02 PM    LOS: 1 day

## 2015-01-28 NOTE — Progress Notes (Signed)
CM consult for new CBG machine.  MD will need to write Rx for CBG machine upon dc; pt can go to retail drugstore to obtain new glucose meter and file with insurance.  Will sign off.  Reinaldo Raddle, RN, BSN  Trauma/Neuro ICU Case Manager [phone]

## 2015-01-29 ENCOUNTER — Inpatient Hospital Stay (HOSPITAL_COMMUNITY): Payer: Medicare Other

## 2015-01-29 ENCOUNTER — Encounter (HOSPITAL_COMMUNITY): Payer: Self-pay | Admitting: Radiology

## 2015-01-29 DIAGNOSIS — I771 Stricture of artery: Secondary | ICD-10-CM

## 2015-01-29 LAB — CBC WITH DIFFERENTIAL/PLATELET
Basophils Absolute: 0 10*3/uL (ref 0.0–0.1)
Basophils Relative: 0 % (ref 0–1)
Eosinophils Absolute: 0.1 10*3/uL (ref 0.0–0.7)
Eosinophils Relative: 2 % (ref 0–5)
HEMATOCRIT: 34.8 % — AB (ref 39.0–52.0)
Hemoglobin: 11.6 g/dL — ABNORMAL LOW (ref 13.0–17.0)
LYMPHS ABS: 1.3 10*3/uL (ref 0.7–4.0)
Lymphocytes Relative: 18 % (ref 12–46)
MCH: 34 pg (ref 26.0–34.0)
MCHC: 33.3 g/dL (ref 30.0–36.0)
MCV: 102.1 fL — ABNORMAL HIGH (ref 78.0–100.0)
MONOS PCT: 5 % (ref 3–12)
Monocytes Absolute: 0.4 10*3/uL (ref 0.1–1.0)
Neutro Abs: 5.1 10*3/uL (ref 1.7–7.7)
Neutrophils Relative %: 74 % (ref 43–77)
PLATELETS: 80 10*3/uL — AB (ref 150–400)
RBC: 3.41 MIL/uL — ABNORMAL LOW (ref 4.22–5.81)
RDW: 14.3 % (ref 11.5–15.5)
WBC: 6.9 10*3/uL (ref 4.0–10.5)

## 2015-01-29 LAB — GLUCOSE, CAPILLARY: Glucose-Capillary: 86 mg/dL (ref 65–99)

## 2015-01-29 LAB — WOUND CULTURE
CULTURE: NO GROWTH
Gram Stain: NONE SEEN
SPECIAL REQUESTS: NORMAL

## 2015-01-29 LAB — COMPREHENSIVE METABOLIC PANEL
ALK PHOS: 26 U/L — AB (ref 38–126)
ALT: 14 U/L — ABNORMAL LOW (ref 17–63)
AST: 21 U/L (ref 15–41)
Albumin: 3.2 g/dL — ABNORMAL LOW (ref 3.5–5.0)
Anion gap: 10 (ref 5–15)
BUN: 19 mg/dL (ref 6–20)
CO2: 22 mmol/L (ref 22–32)
Calcium: 9 mg/dL (ref 8.9–10.3)
Chloride: 103 mmol/L (ref 101–111)
Creatinine, Ser: 1.44 mg/dL — ABNORMAL HIGH (ref 0.61–1.24)
GFR calc Af Amer: 58 mL/min — ABNORMAL LOW (ref >60)
GFR calc non Af Amer: 50 mL/min — ABNORMAL LOW (ref >60)
GLUCOSE: 123 mg/dL — AB (ref 65–99)
POTASSIUM: 3.4 mmol/L — AB (ref 3.5–5.1)
Sodium: 135 mmol/L (ref 135–145)
Total Bilirubin: 1.1 mg/dL (ref 0.3–1.2)
Total Protein: 6.1 g/dL — ABNORMAL LOW (ref 6.5–8.1)

## 2015-01-29 MED ORDER — IOHEXOL 300 MG/ML  SOLN
80.0000 mL | Freq: Once | INTRAMUSCULAR | Status: AC | PRN
  Administered 2015-01-29: 80 mL via INTRAVENOUS

## 2015-01-29 NOTE — Progress Notes (Signed)
Eric [name] WFU:[id number] DOB: 1951/05/21 DOA: 01/27/2015 PCP: Cathlean Cower, MD  Brief narrative:  64 y/o ? 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 Bridgett Larsson of [facility]--Buerger's disease and poor surgical candidate admitted 01/27/15 c LE cellulitis and also incidental AKI/CKD He developed LLE swelling after a bite to his LLE ~1 week pta-refused to be seen by MD.  Since the pain got so bad he decided to come to [facility]  Antibiotics:  IV Vanc 6/8------>ongoing  IV clinda 6/8-->6/9    Subjective   Weeping wound today Much more calm today No pain  tol diet No fever nor chills   Objective    Interim History:   Telemetry:    Objective: Filed Vitals:   01/28/15 0609 01/28/15 1400 01/28/15 2109 01/29/15 0447  BP: 119/54 117/57 125/54 108/41  Pulse: 82 77 83 93  Temp: 98.1 F (36.7 C) 98.8 F (37.1 C) 98.7 F (37.1 C) 97.5 F (36.4 C)  TempSrc:  Oral Axillary Oral  Resp: '19 22 22 24  '$ Height:      Weight: 143.7 kg (316 lb 12.8 oz)   148.3 kg (326 lb 15.1 oz)  SpO2: 94% 96% 96% 91%    Intake/Output Summary (Last 24 hours) at 01/29/15 1445 Last data filed at 01/29/15 0907  Gross per 24 hour  Intake   2300 ml  Output    740 ml  Net   1560 ml    Exam:  General: Morbid obesity, Body mass index is 44.33 kg/(m^2). Thick Neck, cannot app JVD Cardiovascular: S1-S2 no murmur rub or gallop Respiratory: Clinically clear no added sound no rales no rhonchi Abdomen: Soft nontender nondistended but obese Skin red LLE edema with bleb 4x4 over outer lateral aspect of the calf, looks worse than yesterday, cannot appreciate DP or PT arterial flow on either side Neuro intact  Data Reviewed: Basic Metabolic Panel:  Recent Labs Lab 01/27/15 1118 01/29/15 0415  NA 140 135  K 3.4* 3.4*  CL 101 103  CO2 27 22  GLUCOSE 109* 123*  BUN 33* 19  CREATININE 1.76* 1.44*  CALCIUM 8.9 9.0   Liver  Function Tests:  Recent Labs Lab 01/27/15 1118 01/29/15 0415  AST 18 21  ALT 13* 14*  ALKPHOS 39 26*  BILITOT 0.6 1.1  PROT 6.2* 6.1*  ALBUMIN 3.6 3.2*   No results for input(s): LIPASE, AMYLASE in the last 168 hours. No results for input(s): AMMONIA in the last 168 hours. CBC:  Recent Labs Lab 01/27/15 1118 01/29/15 0415  WBC 8.4 6.9  NEUTROABS 6.4 5.1  HGB 12.8* 11.6*  HCT 39.0 34.8*  MCV 103.4* 102.1*  PLT 81* 80*   Cardiac Enzymes: No results for input(s): CKTOTAL, CKMB, CKMBINDEX, TROPONINI in the last 168 hours. BNP: Invalid input(s): POCBNP CBG:  Recent Labs Lab 01/28/15 0746 01/28/15 1119 01/28/15 1854 01/28/15 2107 01/29/15 0808  GLUCAP 100* 156* 91 82 86    Recent Results (from the past 240 hour(s))  Wound culture     Status: None   Collection Time: 01/27/15 11:22 AM  Result Value Ref Range Status   Specimen Description WOUND LEFT LEG  Final   Special Requests Normal  Final   Gram Stain   Final    NO WBC SEEN NO SQUAMOUS EPITHELIAL CELLS SEEN NO ORGANISMS SEEN Performed at [facility]   Final  NO GROWTH 2 DAYS Performed at [facility]    Report Status 01/29/2015 FINAL  Final  Culture, blood (routine x 2)     Status: None (Preliminary result)   Collection Time: 01/27/15 11:45 AM  Result Value Ref Range Status   Specimen Description BLOOD LEFT ASSIST CONTROL  Final   Special Requests BAA 5CC  Final   Culture   Final    GRAM POSITIVE COCCI IN CLUSTERS Note: Gram Stain Report Called to,Read Back By and Verified With: Derald Macleod @ [id number] ON [phone] BY [facility] Performed at [facility]    Report Status PENDING  Incomplete  Culture, blood (routine x 2)     Status: None (Preliminary result)   Collection Time: 01/27/15 11:50 AM  Result Value Ref Range Status   Specimen Description BLOOD RIGHT FOREARM  Final   Special Requests BAA 5CC   Final   Culture   Final           BLOOD CULTURE RECEIVED NO GROWTH TO  DATE CULTURE WILL BE HELD FOR 5 DAYS BEFORE ISSUING A FINAL NEGATIVE REPORT Performed at [facility]    Report Status PENDING  Incomplete     Studies:              All Imaging reviewed and is as per above notation   Scheduled Meds: . aspirin EC  81 mg Oral Daily  . atorvastatin  40 mg Oral q1800  . clonazePAM  0.5 mg Oral QHS  . famotidine  20 mg Oral BID  . fenofibrate  160 mg Oral Daily  . gabapentin  200 mg Oral QHS  . glipiZIDE  5 mg Oral Q breakfast  . labetalol  300 mg Oral BID  . linagliptin  5 mg Oral Daily  . mometasone-formoterol  2 puff Inhalation BID  . vancomycin  1,750 mg Intravenous Q24H   Continuous Infusions:    Assessment/Plan:  1. Acute purulent cellulitis of lower extremity in the setting of Buerger's disease-need sIV Vanc until [facility] x1 done 6/8 speciates.  Because of Bleb to the outer aspect of the LLE, I have asked Vascular surgery to see the patient in consult as I fear he might need some surgical intervention to the area, but am worried with his poor blood supply that this area might not heal well 2. Tobacco abuse-patient insistent on not needing tobacco patch-pre-contemplative 3. Mood disorder not otherwise specified- Continue clonazepam 0.5 twice a day. 4. 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 Home meds glipizide 5 mg, linagliptin 5 mg, performed 1000 twice a day at home-- hold off on his metformin because of his marginal renal function for now. His A1c 6.2 The current medical regimen is effective;  continue present plan and medications-continue glipizide and  linagliptin 10 cautiously as these are renally cleared as well-continue gabapentin cautiously 200 daily at bedtime Acute renal insufficiency-repeat complete metabolic panel in a.m. GFR ? 58 from 45 5. Nonoperative severe lower extremity claudication-patient does not have any ulceration on his  lower extremities however is followed by vascular surgery Dr. Vallarie Mare for AAA-he has poor insight into causality of this i.e. smoking. He does not wish to quit smoking.  6. Stage II Gold COPD-continue Atrovent 0.5 every 4 when necessary, Dulera 100-5 daily 7. Hyperlipidemia-continue Lipitor 40 daily, fenofibrate 160 daily 8. Hypertension continue labetalol 300 twice a day-this medication has no evidence and  probably should be substituted as an outpatient for metoprolol or Coreg  Code Status: Full Family Communication: none + today Disposition Plan: Inpatient pending resolution   Verneita Griffes, MD  [facility] Pager [phone] 01/29/2015, 2:45 PM    LOS: 2 days

## 2015-01-29 NOTE — Consult Note (Signed)
Reason for Consult:left leg cellulitis Referring Physician: Dr Daneil Dolin is an 64 y.o. male.  HPI: 64 year old gentleman admitted with cellulitis and possible infection related to bug bite in his left lateral leg. Has a very complex past medical history. Redness in LLE been there since earlier in week. Normally leg not red but has some chronic b/l LE edema. +smoker 2ppd. Not interested in quitting when I ask him. +DM.  Uses scooter at stores.   Past Medical History  Diagnosis Date  . ALLERGIC RHINITIS 06/24/2007  . 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 ANXIETY 06/24/2007  . CELLULITIS AND ABSCESS OF LEG EXCEPT FOOT 03/28/2010  . COLONIC POLYPS, HX OF 06/24/2007  . CONSTIPATION, CHRONIC, HX OF 06/24/2007  . DEPRESSION 06/24/2007  . DIVERTICULOSIS, COLON 06/24/2007  . HYPERLIPIDEMIA 06/24/2007  . HYPERTENSION 06/24/2007  . INSOMNIA-SLEEP DISORDER-UNSPEC 01/24/2010  . SKIN LESION 01/24/2010  . Unspecified Peripheral Vascular Disease 06/24/2007  . Claudication 06/02/2011  . Coronary artery disease   . COPD (chronic obstructive pulmonary disease)   . DVT (deep venous thrombosis)   . Complication of anesthesia     "they can't put me asleep cause I don't wake up" (01/27/2015)  . OSA (obstructive sleep apnea)   . Heart murmur   . CHF (congestive heart failure)   . Myocardial infarction 1990's X 1    "w/stroke"  . CVA (cerebral vascular accident) 1990's    "w/MI"; denies residual on 01/27/2015  . DIABETES MELLITUS, TYPE II dx'd 2015  . Migraine     "comes over my right eye q now and then" (01/27/2015)  . Arthritis     "hands, knees, hips, neck; q part of the body" (01/27/2015)  . Chronic lower back pain     "I have a steel rod in my back"  . Cellulitis of left leg 01/27/2015    Past Surgical History  Procedure Laterality Date  . Anterior cervical decomp/discectomy fusion  1985    "job related injury"  . Knee arthroscopy Bilateral 1990  . Ligament repair Right 1999    "wrist; job related injury"  .  Lumbar laminectomy  1990's  . Back surgery      Family History  Problem Relation Age of Onset  . Cancer Mother     lung cancer  . COPD Father   . Heart disease Father   . Heart disease Maternal Grandmother   . Heart disease Maternal Grandfather   . Heart disease Brother   . Hypertension Brother   . Heart attack Brother   . Peripheral vascular disease Brother     Social History:  reports that he has been smoking Cigars.  He has never used smokeless tobacco. He reports that he drinks alcohol. He reports that he does not use illicit drugs.  Allergies:  Allergies  Allergen Reactions  . Oxycodone Other (See Comments)    Sedation/somnolence    Medications: I have reviewed the patient's current medications.  Results for orders placed or performed during the hospital encounter of 01/27/15 (from the past 48 hour(s))  Hemoglobin A1c     Status: Abnormal   Collection Time: 01/27/15  5:08 PM  Result Value Ref Range   Hgb A1c MFr Bld 6.2 (H) 4.8 - 5.6 %    Comment: (NOTE)         Pre-diabetes: 5.7 - 6.4         Diabetes: >6.4         Glycemic control for adults with diabetes: <7.0  Mean Plasma Glucose 131 mg/dL    Comment: (NOTE) Performed At: [facility] [place], [place] [id number] Lindon Romp MD IR:[id number]   Glucose, capillary     Status: None   Collection Time: 01/27/15 10:00 PM  Result Value Ref Range   Glucose-Capillary 99 65 - 99 mg/dL   Comment 1 Notify RN    Comment 2 Document in Chart   Glucose, capillary     Status: Abnormal   Collection Time: 01/28/15  7:46 AM  Result Value Ref Range   Glucose-Capillary 100 (H) 65 - 99 mg/dL   Comment 1 Notify RN   Glucose, capillary     Status: Abnormal   Collection Time: 01/28/15 11:19 AM  Result Value Ref Range   Glucose-Capillary 156 (H) 65 - 99 mg/dL   Comment 1 Notify RN   Glucose, capillary     Status: None   Collection Time: 01/28/15  6:54 PM  Result Value Ref Range    Glucose-Capillary 91 65 - 99 mg/dL  Glucose, capillary     Status: None   Collection Time: 01/28/15  9:07 PM  Result Value Ref Range   Glucose-Capillary 82 65 - 99 mg/dL  CBC with Differential/Platelet     Status: Abnormal   Collection Time: 01/29/15  4:15 AM  Result Value Ref Range   WBC 6.9 4.0 - 10.5 K/uL   RBC 3.41 (L) 4.22 - 5.81 MIL/uL   Hemoglobin 11.6 (L) 13.0 - 17.0 g/dL   HCT 34.8 (L) 39.0 - 52.0 %   MCV 102.1 (H) 78.0 - 100.0 fL   MCH 34.0 26.0 - 34.0 pg   MCHC 33.3 30.0 - 36.0 g/dL   RDW 14.3 11.5 - 15.5 %   Platelets 80 (L) 150 - 400 K/uL    Comment: CONSISTENT WITH PREVIOUS RESULT   Neutrophils Relative % 74 43 - 77 %   Neutro Abs 5.1 1.7 - 7.7 K/uL   Lymphocytes Relative 18 12 - 46 %   Lymphs Abs 1.3 0.7 - 4.0 K/uL   Monocytes Relative 5 3 - 12 %   Monocytes Absolute 0.4 0.1 - 1.0 K/uL   Eosinophils Relative 2 0 - 5 %   Eosinophils Absolute 0.1 0.0 - 0.7 K/uL   Basophils Relative 0 0 - 1 %   Basophils Absolute 0.0 0.0 - 0.1 K/uL  Comprehensive metabolic panel     Status: Abnormal   Collection Time: 01/29/15  4:15 AM  Result Value Ref Range   Sodium 135 135 - 145 mmol/L   Potassium 3.4 (L) 3.5 - 5.1 mmol/L   Chloride 103 101 - 111 mmol/L   CO2 22 22 - 32 mmol/L   Glucose, Bld 123 (H) 65 - 99 mg/dL   BUN 19 6 - 20 mg/dL   Creatinine, Ser 1.44 (H) 0.61 - 1.24 mg/dL   Calcium 9.0 8.9 - 10.3 mg/dL   Total Protein 6.1 (L) 6.5 - 8.1 g/dL   Albumin 3.2 (L) 3.5 - 5.0 g/dL   AST 21 15 - 41 U/L   ALT 14 (L) 17 - 63 U/L   Alkaline Phosphatase 26 (L) 38 - 126 U/L   Total Bilirubin 1.1 0.3 - 1.2 mg/dL   GFR calc non Af Amer 50 (L) >60 mL/min   GFR calc Af Amer 58 (L) >60 mL/min    Comment: (NOTE) The eGFR has been calculated using the CKD EPI equation. This calculation has not been validated in all clinical situations. eGFR's  persistently <60 mL/min signify possible Chronic Kidney Disease.    Anion gap 10 5 - 15  Glucose, capillary     Status: None   Collection  Time: 01/29/15  8:08 AM  Result Value Ref Range   Glucose-Capillary 86 65 - 99 mg/dL   Comment 1 Notify RN     No results found.  Review of Systems  Constitutional: Positive for chills. Negative for fever and weight loss.       Uses scooter chair at stores  HENT: Negative for nosebleeds.   Eyes: Negative for blurred vision.  Respiratory: Positive for shortness of breath.        Sleeps on 1 pillow  Cardiovascular: Positive for chest pain. Negative for palpitations, orthopnea and PND.       + DOE  Gastrointestinal: Negative for nausea, vomiting and abdominal pain.  Genitourinary: Negative for dysuria and hematuria.  Musculoskeletal: Positive for joint pain.  Skin: Negative for itching and rash.  Neurological: Negative for dizziness, focal weakness, seizures, loss of consciousness and headaches.       Denies TIAs, amaurosis fugax  Endo/Heme/Allergies: Does not bruise/bleed easily.  Psychiatric/Behavioral: The patient is not nervous/anxious.    Blood pressure 113/32, pulse 84, temperature 98.1 F (36.7 C), temperature source Oral, resp. rate 22, height 6' (1.829 m), weight 148.3 kg (326 lb 15.1 oz), SpO2 94 %. Physical Exam  Vitals reviewed. Constitutional: He is oriented to person, place, and time. He appears well-developed and well-nourished. No distress.  Morbidly obese, central truncal obesity  HENT:  Head: Normocephalic and atraumatic.  Right Ear: External ear normal.  Left Ear: External ear normal.  Eyes: Conjunctivae are normal. No scleral icterus.  Neck: Normal range of motion. Neck supple. No tracheal deviation present.  Cardiovascular: Normal rate and normal heart sounds.   Respiratory: Effort normal and breath sounds normal. No stridor. No respiratory distress. He has no wheezes.  GI: Soft. There is no tenderness.  obese  Musculoskeletal: He exhibits edema. He exhibits no tenderness.  Neurological: He is alert and oriented to person, place, and time. He exhibits  normal muscle tone.  Skin: Skin is warm and dry. No rash noted. He is not diaphoretic. There is erythema (see pic). No pallor.     Has erythema from just below L knee to foot and some partial skin loss/open blister on lateral LLE below knee. Has some chronic skin changes to RLE but no cellulitis.  Pitting edema in LLE.   Psychiatric: He has a normal mood and affect. His behavior is normal. Judgment and thought content normal.      Assessment/Plan: LLE cellulitis Morbid obesity  Prediabes/DM 2 Tobacco use OSA HTN COPD Mild AKI  His exam is limited due to body habitus. Not sure if he has underlying abscess or not. Given his co-morbidities, I think we need imaging before intervention.   Will CT scan leg with limited contrast NPO p mn except meds just case CT is positive  Leighton Ruff. Redmond Pulling, MD, FACS General, Bariatric, & Minimally Invasive Surgery [facility], [place]  [facility] M 01/29/2015, 4:48 PM

## 2015-01-29 NOTE — Consult Note (Signed)
Patient name: Eric [name] MRN: [id number] DOB: 04-10-1951 Sex: male   Referred by: Verlon Au  Reason for referral:  Chief Complaint  Patient presents with  . Insect Bite    HISTORY OF PRESENT ILLNESS: 64 year old gentleman admitted with cellulitis and possible infection related to bug bite in his left lateral leg. Has a very complex past medical history. Apparently had diagnosis of Buerger's disease in the past but I do not have any evidence of arteriogram or other documentation of this. I saw Dr. Bridgett Larsson in our office in January 2016 with an ultrasound finding of a small infrarenal abdominal aortic aneurysm. This was to be followed with serial ultrasounds. He is seen now for concern regarding adequacy of blood flow to his lower extremity for healing of this cellulitis. He denies any or tissue loss or rest pain. Does have chronic Knee discomfort.  Past Medical History  Diagnosis Date  . ALLERGIC RHINITIS 06/24/2007  . 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 ANXIETY 06/24/2007  . CELLULITIS AND ABSCESS OF LEG EXCEPT FOOT 03/28/2010  . COLONIC POLYPS, HX OF 06/24/2007  . CONSTIPATION, CHRONIC, HX OF 06/24/2007  . DEPRESSION 06/24/2007  . DIVERTICULOSIS, COLON 06/24/2007  . HYPERLIPIDEMIA 06/24/2007  . HYPERTENSION 06/24/2007  . INSOMNIA-SLEEP DISORDER-UNSPEC 01/24/2010  . SKIN LESION 01/24/2010  . Unspecified Peripheral Vascular Disease 06/24/2007  . Claudication 06/02/2011  . Coronary artery disease   . COPD (chronic obstructive pulmonary disease)   . DVT (deep venous thrombosis)   . Complication of anesthesia     "they can't put me asleep cause I don't wake up" (01/27/2015)  . OSA (obstructive sleep apnea)   . Heart murmur   . CHF (congestive heart failure)   . Myocardial infarction 1990's X 1    "w/stroke"  . CVA (cerebral vascular accident) 1990's    "w/MI"; denies residual on 01/27/2015  . DIABETES MELLITUS, TYPE II dx'd 2015  . Migraine     "comes over my right eye q now and then" (01/27/2015)  .  Arthritis     "hands, knees, hips, neck; q part of the body" (01/27/2015)  . Chronic lower back pain     "I have a steel rod in my back"  . Cellulitis of left leg 01/27/2015    Past Surgical History  Procedure Laterality Date  . Anterior cervical decomp/discectomy fusion  1985    "job related injury"  . Knee arthroscopy Bilateral 1990  . Ligament repair Right 1999    "wrist; job related injury"  . Lumbar laminectomy  1990's  . Back surgery      History   Social History  . Marital Status: Married    Spouse Name: N/A  . Number of Children: 3  . Years of Education: N/A   Occupational History  . 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 Smoking status: Current Every Day Smoker -- 48 years    Types: Cigars  . Smokeless tobacco: Never Used     Comment: 01/27/2015 pt states he smokes 5 cigar cigarettes daily  . Alcohol Use: 0.0 oz/week    0 Standard drinks or equivalent per week     Comment: 01/27/2015 "might have a couple beers/yr"  . Drug Use: No  . Sexual Activity: No   Other Topics Concern  . Not on file   Social History Narrative    Family History  Problem Relation Age of Onset  .  Cancer Mother     lung cancer  . COPD Father   . Heart disease Father   . Heart disease Maternal Grandmother   . Heart disease Maternal Grandfather   . Heart disease Brother   . Hypertension Brother   . Heart attack Brother   . Peripheral vascular disease Brother     Allergies as of 01/27/2015 - Review Complete 01/27/2015  Allergen Reaction Noted  . Oxycodone Other (See Comments) 06/21/2011    No current facility-administered medications on file prior to encounter.   Current Outpatient Prescriptions on File Prior to Encounter  Medication Sig Dispense Refill  . aspirin 81 MG tablet Take 81 mg by mouth daily.      . Blood Glucose Monitoring Suppl DEVI 1 Device by Does not apply route daily. 1 each 0  . clonazePAM (KLONOPIN)  0.5 MG tablet Take 1-2 tablets (0.5-1 mg total) by mouth at bedtime. Pt can take up to 2 depending on anxiety level 60 tablet 5  . fenofibrate 160 MG tablet Take 1 tablet (160 mg total) by mouth daily. 30 tablet 11  . furosemide (LASIX) 20 MG tablet Take 0.5 tablets (10 mg total) by mouth daily. 30 tablet 1  . gabapentin (NEURONTIN) 100 MG capsule Take 2 capsules (200 mg total) by mouth at bedtime. 60 capsule 3  . glipiZIDE (GLUCOTROL XL) 5 MG 24 hr tablet Take 1 tablet (5 mg total) by mouth daily with breakfast. 90 tablet 3  . glucose blood test strip Use as instructed 100 each 12  . hydrochlorothiazide (HYDRODIURIL) 25 MG tablet Take 1 tablet (25 mg total) by mouth daily. 30 tablet 11  . irbesartan (AVAPRO) 300 MG tablet Take 1 tablet (300 mg total) by mouth daily. 30 tablet 2  . labetalol (NORMODYNE) 300 MG tablet Take 1 tablet (300 mg total) by mouth 2 (two) times daily. 60 tablet 11  . Lancets (ACCU-CHEK SOFT TOUCH) lancets Use as instructed 100 each 12  . metFORMIN (GLUCOPHAGE) 1000 MG tablet Take 1 tablet (1,000 mg total) by mouth 2 (two) times daily with a meal. 60 tablet 11  . pioglitazone (ACTOS) 30 MG tablet Take 1 tablet (30 mg total) by mouth daily. 30 tablet 11  . simvastatin (ZOCOR) 80 MG tablet Take 1 tablet (80 mg total) by mouth at bedtime. 30 tablet 2     REVIEW OF SYSTEMS:  Reviewed in his past history with nothing to add  PHYSICAL EXAMINATION:  General: The patient is a obese male, in no acute distress. Does have some baseline shortness of breath. Denies any change from his normal. Vital signs are BP 113/32 mmHg  Pulse 84  Temp(Src) 98.1 F (36.7 C) (Oral)  Resp 22  Ht 6' (1.829 m)  Wt 326 lb 15.1 oz (148.3 kg)  BMI 44.33 kg/m2  SpO2 94% Pulmonary: There is a good air exchange  Abdomen: Soft obese nontender Musculoskeletal: Erythema from his knee distally on his left foot. No similar changes on the right. Changes Neurologic: No focal weakness or paresthesias  are detected, Skin: Superficial skin loss over the left lateral leg just below the knee. Some cloudy drainage from this. Diffuse erythema. Psychiatric: The patient has normal affect. No palpable pedal pulses. Hand-held Doppler reveals a good Doppler signal and dorsalis pedis, posterior tibial and peroneal bilaterally at the ankle    Vascular Lab Studies:  Venous duplex was reviewed which showed no evidence of DVT  Impression and Plan:  Her complex patient with mild to moderate  lower extremity arterial insufficiency. Do not feel that this cellulitis and wound is related to arterial insufficiency. I do feel that he has adequate flow for healing of this lesion. Will obtain noninvasive arterial studies is a baseline. Concerned that he may have deeper tissue infection.  Discussed with Dr Glynis Smiles [facility] Office: [phone]

## 2015-01-30 ENCOUNTER — Inpatient Hospital Stay (HOSPITAL_COMMUNITY): Payer: Medicare Other

## 2015-01-30 DIAGNOSIS — I739 Peripheral vascular disease, unspecified: Secondary | ICD-10-CM

## 2015-01-30 LAB — CULTURE, BLOOD (ROUTINE X 2)

## 2015-01-30 LAB — GLUCOSE, CAPILLARY: Glucose-Capillary: 101 mg/dL — ABNORMAL HIGH (ref 65–99)

## 2015-01-30 NOTE — Progress Notes (Signed)
Patient ID: Eric [name], male   DOB: 1951/05/24, 64 y.o.   MRN: [id number] General surgery evaluation and results of CT of his leg noted. 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 Bridgett Larsson. Radiologist suggested ultrasound of his aorta. This had been done in our office with ectasia at the level of 3 cm. Will be seen in our office for a follow-up of this.

## 2015-01-30 NOTE — Progress Notes (Signed)
Subjective: Didn't sleep well  Objective: Vital signs in last 24 hours: Temp:  [98.1 F (36.7 C)-98.6 F (37 C)] 98.3 F (36.8 C) (06/11 0520) Pulse Rate:  [76-84] 76 (06/11 0520) Resp:  [21-22] 22 (06/11 0520) BP: (102-126)/(32-94) 102/35 mmHg (06/11 0520) SpO2:  [94 %-95 %] 95 % (06/11 0520) Weight:  [150.277 kg (331 lb 4.8 oz)] 150.277 kg (331 lb 4.8 oz) (06/11 0520) Last BM Date: 01/29/15  Intake/Output from previous day: 06/10 0701 - 06/11 0700 In: 2060 [P.O.:1560; IV Piggyback:500] Out: -  Intake/Output this shift:    Just waking up LLE - unchanged from last pm. Still with cellulitis starting just below L knee extending to foot. Some skin sloughing on L lat calf. Nontender.   Lab Results:   Recent Labs  01/27/15 1118 01/29/15 0415  WBC 8.4 6.9  HGB 12.8* 11.6*  HCT 39.0 34.8*  PLT 81* 80*   BMET  Recent Labs  01/27/15 1118 01/29/15 0415  NA 140 135  K 3.4* 3.4*  CL 101 103  CO2 27 22  GLUCOSE 109* 123*  BUN 33* 19  CREATININE 1.76* 1.44*  CALCIUM 8.9 9.0   PT/INR  Recent Labs  01/27/15 1118  LABPROT 13.8  INR 1.04   ABG No results for input(s): PHART, HCO3 in the last 72 hours.  Invalid input(s): PCO2, PO2  Studies/Results: Ct Tibia Fibula Left W Contrast  01/29/2015   CLINICAL DATA:  Redness/swelling of left lower extremity, cellulitis  EXAM: CT OF THE LEFT TIBIA AND FIBULA WITH CONTRAST  TECHNIQUE: Axial CT images of the left lower extremity were obtained following contrast administration. Sagittal and coronal reformatted images were reconstructed.  CONTRAST:  30m OMNIPAQUE IOHEXOL 300 MG/ML  SOLN  COMPARISON:  None.  FINDINGS: No fracture or dislocation is seen.  Soft tissue swelling/subcutaneous edema in the left lower extremity starting at the level of the mid calf and extending to the ankle, compatible with the history of cellulitis.  No evidence of drainable fluid collection/abscess.  No evidence of soft tissue gas.  No cortical  osseous destruction on CT.  Incidentally noted is fusiform aneurysmal dilatation of the popliteal artery, measuring up to 1.6 cm (series 4/image 9).  IMPRESSION: Soft tissue swelling/subcutaneous edema extending from the mid calf to the ankle, compatible with the history of cellulitis.  No evidence of abscess.  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 Consider ultrasound screening for AAA as clinically warranted.   Electronically Signed   By: SJulian HyM.D.   On: 01/29/2015 20:01    Anti-infectives: Anti-infectives    Start     Dose/Rate Route Frequency Ordered Stop   01/29/15 2100  vancomycin (VANCOCIN) 1,750 mg in sodium chloride 0.9 % 500 mL IVPB     1,750 mg 250 mL/hr over 120 Minutes Intravenous Every 24 hours 01/28/15 1939     01/28/15 1945  vancomycin (VANCOCIN) 2,000 mg in sodium chloride 0.9 % 500 mL IVPB     2,000 mg 250 mL/hr over 120 Minutes Intravenous  Once 01/28/15 1939 01/28/15 2214   01/28/15 1400  clindamycin (CLEOCIN) capsule 600 mg  Status:  Discontinued     600 mg Oral 3 times per day 01/28/15 1324 01/28/15 1839   01/27/15 2200  clindamycin (CLEOCIN) IVPB 600 mg  Status:  Discontinued     600 mg 100 mL/hr over 30 Minutes Intravenous 3 times per day 01/27/15 1906 01/28/15 1324   01/27/15 1430  vancomycin (  VANCOCIN) IVPB 1000 mg/200 mL premix     1,000 mg 200 mL/hr over 60 Minutes Intravenous  Once 01/27/15 1422 01/27/15 1631      Assessment/Plan: LLE cellulitis L popliteal artery aneurysm - defer to vascular Tobacco use Morbid obesity OSA DM2 COPD HTN AKI  No drainable abscess on CT. Just cellulitis and soft tissue edema. Do not feel he needs the lateral calf skin excised at this time.  Would cont abx, elevation of extremity Defer to vascular surgery regarding L popliteal artery May need to check BMET today - he got limited IV contrast last pm, on vanc, etc.  Consider chemical VTE prophylaxis at least while  in hospital given obesity, smoking, etc Signing off. pls call with ?  Leighton Ruff. Redmond Pulling, MD, FACS General, Bariatric, & Minimally Invasive Surgery [facility], [place]    LOS: 3 days    Gayland Curry 01/30/2015

## 2015-01-30 NOTE — Progress Notes (Signed)
Eric [name] QBH:[id number] DOB: Feb 16, 1951 DOA: 01/27/2015 PCP: Cathlean Cower, MD  Brief narrative:  64 y/o ? 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 Bridgett [facility] of [facility]--Buerger's disease and poor surgical candidate admitted 01/27/15 c LE cellulitis and also incidental AKI/CKD He developed LLE swelling after a bite to his LLE ~1 week pta-refused to be seen by MD.  Since the pain got so bad he decided to come to [facility] ED His antiemetics were adjusted while he was in the hospital he grew out one bottle of gram-positive cocci and was transitioned back to IV vancomycin on that day. He developed a bleb over the left lateral aspect of the calf and both vascular surgeon saw the patient as well as general surgery, mainly because he has a significant claudication history He has stabilized  Antibiotics:  IV Vanc 6/8------>ongoing  IV clinda 6/8-->6/9    Subjective   Doing fair. Unhappy with diabetic diet No other real concerns Worried about testing that was done yesterday but reassured Wife present at bedside No fever no chills No nausea no vomiting Ambulatory    Objective    Interim History:   Telemetry:    Objective: Filed Vitals:   01/29/15 1534 01/29/15 2148 01/30/15 0520 01/30/15 1314  BP: 113/32 126/94 102/35 127/58  Pulse: 84 78 76 76  Temp: 98.1 F (36.7 C) 98.6 F (37 C) 98.3 F (36.8 C) 97.9 F (36.6 C)  TempSrc: Oral Oral Oral Oral  Resp: '22 21 22 19  '$ Height:      Weight:   150.277 kg (331 lb 4.8 oz)   SpO2: 94% 95% 95% 95%    Intake/Output Summary (Last 24 hours) at 01/30/15 1713 Last data filed at 01/29/15 2330  Gross per 24 hour  Intake   1340 ml  Output      0 ml  Net   1340 ml    Exam:  General: Morbid obesity, Body mass index is 44.92 kg/(m^2). Thick Neck, cannot app JVD Cardiovascular: S1-S2 no murmur rub or gallop Respiratory: Clinically clear no added sound no rales  no rhonchi Abdomen: Soft nontender nondistended but obese Skin red LLE edema with bleb 4x4 over outer lateral aspect of the calf, looks somewhat better than yesterday and marked out with skin marking pen   Data Reviewed: Basic Metabolic Panel:  Recent Labs Lab 01/27/15 1118 01/29/15 0415  NA 140 135  K 3.4* 3.4*  CL 101 103  CO2 27 22  GLUCOSE 109* 123*  BUN 33* 19  CREATININE 1.76* 1.44*  CALCIUM 8.9 9.0   Liver Function Tests:  Recent Labs Lab 01/27/15 1118 01/29/15 0415  AST 18 21  ALT 13* 14*  ALKPHOS 39 26*  BILITOT 0.6 1.1  PROT 6.2* 6.1*  ALBUMIN 3.6 3.2*   No results for input(s): LIPASE, AMYLASE in the last 168 hours. No results for input(s): AMMONIA in the last 168 hours. CBC:  Recent Labs Lab 01/27/15 1118 01/29/15 0415  WBC 8.4 6.9  NEUTROABS 6.4 5.1  HGB 12.8* 11.6*  HCT 39.0 34.8*  MCV 103.4* 102.1*  PLT 81* 80*   Cardiac Enzymes: No results for input(s): CKTOTAL, CKMB, CKMBINDEX, TROPONINI in the last 168 hours. BNP: Invalid input(s): POCBNP CBG:  Recent Labs Lab 01/28/15 1119 01/28/15 1854 01/28/15 2107 01/29/15 0808 01/30/15 0747  GLUCAP 156* 91 82 86 101*    Recent Results (from the past 240 hour(s))  Wound culture     Status: None   Collection Time: 01/27/15 11:22 AM  Result Value Ref Range Status   Specimen Description WOUND LEFT LEG  Final   Special Requests Normal  Final   Gram Stain   Final    NO WBC SEEN NO SQUAMOUS EPITHELIAL CELLS SEEN NO ORGANISMS SEEN Performed at [facility]    Culture   Final    NO GROWTH 2 DAYS Performed at [facility]    Report Status 01/29/2015 FINAL  Final  Culture, blood (routine x 2)     Status: None   Collection Time: 01/27/15 11:45 AM  Result Value Ref Range Status   Specimen Description BLOOD LEFT ASSIST CONTROL  Final   Special Requests BAA 5CC  Final   Culture   Final    STAPHYLOCOCCUS SPECIES (COAGULASE NEGATIVE) Note: THE SIGNIFICANCE OF ISOLATING THIS  ORGANISM FROM A SINGLE SET OF BLOOD CULTURES WHEN MULTIPLE SETS ARE DRAWN IS UNCERTAIN. PLEASE NOTIFY THE MICROBIOLOGY DEPARTMENT WITHIN ONE WEEK IF SPECIATION AND SENSITIVITIES ARE REQUIRED. Note: Gram Stain Report Called to,Read Back By and Verified With: Derald Macleod @ 2951 ON [phone] BY [facility] Performed at [facility]    Report Status 01/30/2015 FINAL  Final  Culture, blood (routine x 2)     Status: None (Preliminary result)   Collection Time: 01/27/15 11:50 AM  Result Value Ref Range Status   Specimen Description BLOOD RIGHT FOREARM  Final   Special Requests BAA 5CC   Final   Culture   Final           BLOOD CULTURE RECEIVED NO GROWTH TO DATE CULTURE WILL BE HELD FOR 5 DAYS BEFORE ISSUING A FINAL NEGATIVE REPORT Performed at [facility]    Report Status PENDING  Incomplete     Studies:              All Imaging reviewed and is as per above notation   Scheduled Meds: . aspirin EC  81 mg Oral Daily  . atorvastatin  40 mg Oral q1800  . clonazePAM  0.5 mg Oral QHS  . famotidine  20 mg Oral BID  . fenofibrate  160 mg Oral Daily  . gabapentin  200 mg Oral QHS  . glipiZIDE  5 mg Oral Q breakfast  . labetalol  300 mg Oral BID  . linagliptin  5 mg Oral Daily  . mometasone-formoterol  2 puff Inhalation BID  . vancomycin  1,750 mg Intravenous Q24H   Continuous Infusions:    Assessment/Plan:  1. Acute purulent cellulitis of lower extremity in the setting of Buerger's disease-appreciate greatly general surgery, vascular surgery input. Feels safe to transition to by mouth doxycycline in the next 24 hours. Continue vancomycin today and we will review wound again tomorrow. 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 2. 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 early by Dr. Bridgett Larsson 3. Tobacco abuse-patient insistent on not needing tobacco  patch-pre-contemplative 4. Mood disorder not otherwise specified- Continue clonazepam 0.5 twice a day. 5. 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 Nonoperative severe lower extremity claudication-patient does not have any ulceration on his lower extremities however is followed by vascular surgery Dr. Vallarie Mare for AAA 7. Stage II Gold COPD-continue Atrovent 0.5 every 4 when necessary, Dulera 100-5 daily 8. Hyperlipidemia-continue Lipitor 40 daily, fenofibrate 160 daily 9. Hypertension continue labetalol 300 twice a day-this medication has no evidence and probably should be substituted as an outpatient for metoprolol or Coreg  Code Status: Full Family Communication: Discussed with patient and wife at bedside- Disposition Plan: Inpatient pending resolution-probably can discharge in 1-2 days   Verneita Griffes, MD  [facility] Pager [phone] 01/30/2015, 5:13 PM    LOS: 3 days

## 2015-01-30 NOTE — Progress Notes (Signed)
VASCULAR LAB PRELIMINARY  ARTERIAL  ABI completed:    RIGHT    LEFT    PRESSURE WAVEFORM  PRESSURE WAVEFORM  BRACHIAL  triphasic BRACHIAL 136 triphasic  DP   DP    AT 114 triphasic AT 85 Dampened monophasic  PT 126 monophasic PT 88 Dampened monophasic  PER   PER    GREAT TOE  adequate GREAT TOE  adequate    RIGHT LEFT  ABI 0.93 0.65     Guinevere Ferrari, RVT 01/30/2015, 12:57 PM

## 2015-01-31 LAB — BASIC METABOLIC PANEL
Anion gap: 9 (ref 5–15)
BUN: 14 mg/dL (ref 6–20)
CALCIUM: 8.9 mg/dL (ref 8.9–10.3)
CHLORIDE: 102 mmol/L (ref 101–111)
CO2: 25 mmol/L (ref 22–32)
CREATININE: 1.19 mg/dL (ref 0.61–1.24)
GFR calc Af Amer: >60 mL/min (ref >60)
GFR calc non Af Amer: >60 mL/min (ref >60)
GLUCOSE: 86 mg/dL (ref 65–99)
POTASSIUM: 4.1 mmol/L (ref 3.5–5.1)
Sodium: 136 mmol/L (ref 135–145)

## 2015-01-31 LAB — GLUCOSE, CAPILLARY: GLUCOSE-CAPILLARY: 101 mg/dL — AB (ref 65–99)

## 2015-01-31 LAB — CBC WITH DIFFERENTIAL/PLATELET
BASOS PCT: 0 % (ref 0–1)
Basophils Absolute: 0 10*3/uL (ref 0.0–0.1)
EOS ABS: 0.2 10*3/uL (ref 0.0–0.7)
EOS PCT: 3 % (ref 0–5)
HEMATOCRIT: 34.7 % — AB (ref 39.0–52.0)
Hemoglobin: 11.3 g/dL — ABNORMAL LOW (ref 13.0–17.0)
LYMPHS ABS: 1.5 10*3/uL (ref 0.7–4.0)
Lymphocytes Relative: 31 % (ref 12–46)
MCH: 33.7 pg (ref 26.0–34.0)
MCHC: 32.6 g/dL (ref 30.0–36.0)
MCV: 103.6 fL — ABNORMAL HIGH (ref 78.0–100.0)
Monocytes Absolute: 0.5 10*3/uL (ref 0.1–1.0)
Monocytes Relative: 10 % (ref 3–12)
NEUTROS PCT: 55 % (ref 43–77)
Neutro Abs: 2.6 10*3/uL (ref 1.7–7.7)
PLATELETS: 107 10*3/uL — AB (ref 150–400)
RBC: 3.35 MIL/uL — ABNORMAL LOW (ref 4.22–5.81)
RDW: 14.1 % (ref 11.5–15.5)
WBC: 4.7 10*3/uL (ref 4.0–10.5)

## 2015-01-31 LAB — PHOSPHORUS: PHOSPHORUS: 4 mg/dL (ref 2.5–4.6)

## 2015-01-31 MED ORDER — SODIUM CHLORIDE 0.9 % IV SOLN
1500.0000 mg | Freq: Two times a day (BID) | INTRAVENOUS | Status: DC
Start: 2015-01-31 — End: 2015-02-02
  Administered 2015-01-31 – 2015-02-01 (×4): 1500 mg via INTRAVENOUS
  Filled 2015-01-31 (×7): qty 1500

## 2015-01-31 MED ORDER — HYDROCODONE-ACETAMINOPHEN 5-325 MG PO TABS
1.0000 | ORAL_TABLET | ORAL | Status: DC | PRN
  Administered 2015-01-31 – 2015-02-01 (×5): 1 via ORAL
  Filled 2015-01-31 (×5): qty 1

## 2015-01-31 NOTE — Progress Notes (Signed)
Eric [name] [id number]:[id number] DOB: Jun 05, 1951 DOA: 01/27/2015 PCP: Cathlean Cower, MD  Brief narrative:  64 y/o ? 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 Bridgett [facility]sson of [facility]--Buerger's disease and poor surgical candidate admitted 01/27/15 c LE cellulitis and also incidental AKI/CKD He developed LLE swelling after a bite to his LLE ~1 week pta-refused to be seen by MD.  Since the pain got so bad he decided to come to [facility] ED His antiemetics were adjusted while he was in the hospital he grew out one bottle of gram-positive cocci and was transitioned back to IV vancomycin on that day. He developed a bleb over the left lateral aspect of the calf and both vascular surgeon saw the patient as well as general surgery, mainly because he has a significant claudication history He has stabilized  Antibiotics:  IV Vanc 6/8------>ongoing  IV clinda 6/8-->6/9    Subjective   Doing fair tol diet Worried about his stomach issues and wants to know what is going on Asks if he can walk around Pain in abd ~7/10 Wants ot know if he can have his iron back   Objective    Interim History:   Telemetry:    Objective: Filed Vitals:   01/30/15 1314 01/30/15 2100 01/31/15 0601 01/31/15 1313  BP: 127/58 130/53 142/66 128/55  Pulse: 76 74 74 75  Temp: 97.9 F (36.6 C) 98 F (36.7 C) 99.3 F (37.4 C) 97.8 F (36.6 C)  TempSrc: Oral Oral Oral Oral  Resp: '19 18 22 18  '$ Height:      Weight:   152 kg (335 lb 1.6 oz)   SpO2: 95% 94% 94% 95%    Intake/Output Summary (Last 24 hours) at 01/31/15 1556 Last data filed at 01/31/15 0602  Gross per 24 hour  Intake    700 ml  Output      0 ml  Net    700 ml    Exam:  General: Morbid obesity, Body mass index is 45.44 kg/(m^2). Thick Neck, cannot app JVD Cardiovascular: S1-S2 no murmur rub or gallop Respiratory: Clinically clear no added sound no rales no rhonchi Abdomen: Soft  nontender nondistended but obese Skin: red LLE edema with bleb 4x4 over outer lateral aspect of the calf     Data Reviewed: Basic Metabolic Panel:  Recent Labs Lab 01/27/15 1118 01/29/15 0415 01/31/15 0405  NA 140 135 136  K 3.4* 3.4* 4.1  CL 101 103 102  CO2 '27 22 25  '$ GLUCOSE 109* 123* 86  BUN 33* 19 14  CREATININE 1.76* 1.44* 1.19  CALCIUM 8.9 9.0 8.9  PHOS  --   --  4.0   Liver Function Tests:  Recent Labs Lab 01/27/15 1118 01/29/15 0415  AST 18 21  ALT 13* 14*  ALKPHOS 39 26*  BILITOT 0.6 1.1  PROT 6.2* 6.1*  ALBUMIN 3.6 3.2*   No results for input(s): LIPASE, AMYLASE in the last 168 hours. No results for input(s): AMMONIA in the last 168 hours. CBC:  Recent Labs Lab 01/27/15 1118 01/29/15 0415 01/31/15 0405  WBC 8.4 6.9 4.7  NEUTROABS 6.4 5.1 2.6  HGB 12.8* 11.6* 11.3*  HCT 39.0 34.8* 34.7*  MCV 103.4* 102.1* 103.6*  PLT 81* 80* 107*   Cardiac Enzymes: No results for input(s): CKTOTAL, CKMB, CKMBINDEX, TROPONINI in the last 168 hours. BNP: Invalid input(s): POCBNP CBG:  Recent Labs Lab 01/28/15 1854 01/28/15 2107  01/29/15 0808 01/30/15 0747 01/31/15 0807  GLUCAP 91 82 86 101* 101*    Recent Results (from the past 240 hour(s))  Wound culture     Status: None   Collection Time: 01/27/15 11:22 AM  Result Value Ref Range Status   Specimen Description WOUND LEFT LEG  Final   Special Requests Normal  Final   Gram Stain   Final    NO WBC SEEN NO SQUAMOUS EPITHELIAL CELLS SEEN NO ORGANISMS SEEN Performed at [facility]    Culture   Final    NO GROWTH 2 DAYS Performed at [facility]    Report Status 01/29/2015 FINAL  Final  Culture, blood (routine x 2)     Status: None   Collection Time: 01/27/15 11:45 AM  Result Value Ref Range Status   Specimen Description BLOOD LEFT ASSIST CONTROL  Final   Special Requests BAA 5CC  Final   Culture   Final    STAPHYLOCOCCUS SPECIES (COAGULASE NEGATIVE) Note: THE SIGNIFICANCE  OF ISOLATING THIS ORGANISM FROM A SINGLE SET OF BLOOD CULTURES WHEN MULTIPLE SETS ARE DRAWN IS UNCERTAIN. PLEASE NOTIFY THE MICROBIOLOGY DEPARTMENT WITHIN ONE WEEK IF SPECIATION AND SENSITIVITIES ARE REQUIRED. Note: Gram Stain Report Called to,Read Back By and Verified With: Derald Macleod @ 9937 ON [phone] BY [facility] Performed at [facility]    Report Status 01/30/2015 FINAL  Final  Culture, blood (routine x 2)     Status: None (Preliminary result)   Collection Time: 01/27/15 11:50 AM  Result Value Ref Range Status   Specimen Description BLOOD RIGHT FOREARM  Final   Special Requests BAA 5CC   Final   Culture   Final           BLOOD CULTURE RECEIVED NO GROWTH TO DATE CULTURE WILL BE HELD FOR 5 DAYS BEFORE ISSUING A FINAL NEGATIVE REPORT Performed at [facility]    Report Status PENDING  Incomplete     Studies:              All Imaging reviewed and is as per above notation   Scheduled Meds: . atorvastatin  40 mg Oral q1800  . clonazePAM  0.5 mg Oral QHS  . famotidine  20 mg Oral BID  . fenofibrate  160 mg Oral Daily  . gabapentin  200 mg Oral QHS  . glipiZIDE  5 mg Oral Q breakfast  . labetalol  300 mg Oral BID  . linagliptin  5 mg Oral Daily  . mometasone-formoterol  2 puff Inhalation BID  . vancomycin  1,500 mg Intravenous Q12H   Continuous Infusions:    Assessment/Plan:  1. Acute purulent cellulitis of lower extremity in the setting of Buerger's disease-appreciate greatly general surgery, vascular surgery input.  Continue vancomycin-woun appears to be pointing with some pruulence as above. 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 2. 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  Donnetta Hutching by Dr. Bridgett Larsson 3. Tobacco abuse-patient insistent on not needing tobacco patch-pre-contemplative 4. Mood disorder not otherwise specified- Continue clonazepam 0.5  twice a day. 5. 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  Home meds glipizide 5 mg, linagliptin 5 mg,   hold off on his metformin because of his marginal renal function for now.  Continue present plan and medications-continue glipizide and  linagliptin 10 cautiously as these are renally cleared as  well-continue gabapentin cautiously 200 daily at bedtime 6. Acute renal resolving, repeat labs in the morning 7. Nonoperative severe lower extremity claudication-patient does not have any ulceration on his lower extremities however is followed by vascular surgery Dr. Bridgett Larsson for AAA 8. Stage II Gold COPD-continue Atrovent 0.5 every 4 when necessary, Dulera 100-5 daily 9. Hyperlipidemia-continue Lipitor 40 daily, fenofibrate 160 daily 10. Hypertension continue labetalol 300 twice a day-this medication has no evidence and probably should be substituted as an outpatient for metoprolol or Coreg  Code Status: Full Family Communication: Discussed with patient and wife at bedside Disposition Plan: Inpatient pending resolution-probably can discharge in 1-2 days   Verneita Griffes, MD  [facility] Pager [phone] 01/31/2015, 3:56 PM    LOS: 4 days

## 2015-01-31 NOTE — Progress Notes (Signed)
ANTIBIOTIC CONSULT NOTE - FOLLOW UP  Pharmacy Consult for Vancomycin Indication: Cellulitis and possible GPC bacteremia  Allergies  Allergen Reactions  . Oxycodone Other (See Comments)    Sedation/somnolence    Patient Measurements: Height: 6' (182.9 cm) Weight: (!) 335 lb 1.6 oz (152 kg) IBW/kg (Calculated) : 77.6 Adjusted Body Weight:    Vital Signs: Temp: 99.3 F (37.4 C) (06/12 0601) Temp Source: Oral (06/12 0601) BP: 142/66 mmHg (06/12 0601) Pulse Rate: 74 (06/12 0601) Intake/Output from previous day: 06/11 0701 - 06/12 0700 In: 700 [P.O.:700] Out: -  Intake/Output from this shift:    Labs:  Recent Labs  01/29/15 0415 01/31/15 0405  WBC 6.9 4.7  HGB 11.6* 11.3*  PLT 80* 107*  CREATININE 1.44* 1.19   Estimated Creatinine Clearance: 95.3 mL/min (by C-G formula based on Cr of 1.19). No results for input(s): VANCOTROUGH, VANCOPEAK, VANCORANDOM, GENTTROUGH, GENTPEAK, GENTRANDOM, TOBRATROUGH, TOBRAPEAK, TOBRARND, AMIKACINPEAK, AMIKACINTROU, AMIKACIN in the last 72 hours.   Microbiology: Recent Results (from the past 720 hour(s))  Wound culture     Status: None   Collection Time: 01/27/15 11:22 AM  Result Value Ref Range Status   Specimen Description WOUND LEFT LEG  Final   Special Requests Normal  Final   Gram Stain   Final    NO WBC SEEN NO SQUAMOUS EPITHELIAL CELLS SEEN NO ORGANISMS SEEN Performed at [facility]    Culture   Final    NO GROWTH 2 DAYS Performed at [facility]    Report Status 01/29/2015 FINAL  Final  Culture, blood (routine x 2)     Status: None   Collection Time: 01/27/15 11:45 AM  Result Value Ref Range Status   Specimen Description BLOOD LEFT ASSIST CONTROL  Final   Special Requests BAA 5CC  Final   Culture   Final    STAPHYLOCOCCUS SPECIES (COAGULASE NEGATIVE) Note: THE SIGNIFICANCE OF ISOLATING THIS ORGANISM FROM A SINGLE SET OF BLOOD CULTURES WHEN MULTIPLE SETS ARE DRAWN IS UNCERTAIN. PLEASE NOTIFY THE  MICROBIOLOGY DEPARTMENT WITHIN ONE WEEK IF SPECIATION AND SENSITIVITIES ARE REQUIRED. Note: Gram Stain Report Called to,Read Back By and Verified With: Derald Macleod @ 0962 ON [phone] BY [facility] Performed at [facility]    Report Status 01/30/2015 FINAL  Final  Culture, blood (routine x 2)     Status: None (Preliminary result)   Collection Time: 01/27/15 11:50 AM  Result Value Ref Range Status   Specimen Description BLOOD RIGHT FOREARM  Final   Special Requests BAA 5CC   Final   Culture   Final           BLOOD CULTURE RECEIVED NO GROWTH TO DATE CULTURE WILL BE HELD FOR 5 DAYS BEFORE ISSUING A FINAL NEGATIVE REPORT Performed at [facility]    Report Status PENDING  Incomplete    Anti-infectives    Start     Dose/Rate Route Frequency Ordered Stop   01/29/15 2100  vancomycin (VANCOCIN) 1,750 mg in sodium chloride 0.9 % 500 mL IVPB     1,750 mg 250 mL/hr over 120 Minutes Intravenous Every 24 hours 01/28/15 1939     01/28/15 1945  vancomycin (VANCOCIN) 2,000 mg in sodium chloride 0.9 % 500 mL IVPB     2,000 mg 250 mL/hr over 120 Minutes Intravenous  Once 01/28/15 1939 01/28/15 2214   01/28/15 1400  clindamycin (CLEOCIN) capsule 600 mg  Status:  Discontinued     600 mg Oral 3 times per day  01/28/15 1324 01/28/15 1839   01/27/15 2200  clindamycin (CLEOCIN) IVPB 600 mg  Status:  Discontinued     600 mg 100 mL/hr over 30 Minutes Intravenous 3 times per day 01/27/15 1906 01/28/15 1324   01/27/15 1430  vancomycin (VANCOCIN) IVPB 1000 mg/200 mL premix     1,000 mg 200 mL/hr over 60 Minutes Intravenous  Once 01/27/15 1422 01/27/15 1631      Assessment: 63 YOM presented with left leg swelling, redness and pain s/p insect bite.  Now with GPC bacteremia and Pharmacy consulted to transition antibiotic to vancomycin.    Infectious Disease: Vanc D#4 for bacteremia + left leg cellulitis (left knee down to foot, skin sloughing) , hx insect bite. WBC nml, afeb. Leg CT with no drainable  abscess. Renal function improving  Vanc 6/8 >> Clinda 6/8 >> 6/9  6/8 BCx - CNS x 1 6/8 left leg wound cx - NGTD  Goal of Therapy:  Vancomycin trough level 15-20 mcg/ml  Plan:  - With improving renal function, need to increase Vancomycin to '1500mg'$  IV q12h. - Monitor renal fxn, clinical course, vanc trough at Css  Eric [name], [employer] 01/31/2015,10:15 AM

## 2015-02-01 ENCOUNTER — Other Ambulatory Visit: Payer: Self-pay | Admitting: *Deleted

## 2015-02-01 DIAGNOSIS — I739 Peripheral vascular disease, unspecified: Secondary | ICD-10-CM

## 2015-02-01 DIAGNOSIS — I724 Aneurysm of artery of lower extremity: Secondary | ICD-10-CM

## 2015-02-01 DIAGNOSIS — I714 Abdominal aortic aneurysm, without rupture, unspecified: Secondary | ICD-10-CM

## 2015-02-01 LAB — COMPREHENSIVE METABOLIC PANEL
ALBUMIN: 3.2 g/dL — AB (ref 3.5–5.0)
ALT: 20 U/L (ref 17–63)
AST: 27 U/L (ref 15–41)
Alkaline Phosphatase: 33 U/L — ABNORMAL LOW (ref 38–126)
Anion gap: 9 (ref 5–15)
BUN: 13 mg/dL (ref 6–20)
CO2: 25 mmol/L (ref 22–32)
Calcium: 9.3 mg/dL (ref 8.9–10.3)
Chloride: 107 mmol/L (ref 101–111)
Creatinine, Ser: 1.24 mg/dL (ref 0.61–1.24)
GFR calc Af Amer: >60 mL/min (ref >60)
GFR calc non Af Amer: 60 mL/min — ABNORMAL LOW (ref >60)
Glucose, Bld: 87 mg/dL (ref 65–99)
Potassium: 4.2 mmol/L (ref 3.5–5.1)
Sodium: 141 mmol/L (ref 135–145)
TOTAL PROTEIN: 6 g/dL — AB (ref 6.5–8.1)
Total Bilirubin: 0.6 mg/dL (ref 0.3–1.2)

## 2015-02-01 LAB — PROTIME-INR
INR: 1.07 (ref 0.00–1.49)
Prothrombin Time: 14.1 seconds (ref 11.6–15.2)

## 2015-02-01 LAB — GLUCOSE, CAPILLARY: Glucose-Capillary: 103 mg/dL — ABNORMAL HIGH (ref 65–99)

## 2015-02-01 MED ORDER — LIDOCAINE-EPINEPHRINE 1 %-1:100000 IJ SOLN
10.0000 mL | Freq: Once | INTRAMUSCULAR | Status: DC
  Filled 2015-02-01: qty 10

## 2015-02-01 MED ORDER — HYDROCODONE-ACETAMINOPHEN 5-325 MG PO TABS
1.0000 | ORAL_TABLET | ORAL | Status: DC
  Administered 2015-02-01 (×3): 1 via ORAL
  Filled 2015-02-01 (×3): qty 1

## 2015-02-01 NOTE — Progress Notes (Signed)
Patient ID: Eric [name], male   DOB: 1950/11/12, 64 y.o.   MRN: [id number]     [place]      [address] [place], [place] [address]    Phone: [phone] FAX: [phone]     Subjective: Sore.  Afebrile.    Objective:  Vital signs:  Filed Vitals:   01/31/15 2240 01/31/15 2242 02/01/15 0500 02/01/15 0606  BP: 140/58   122/47  Pulse: 68   71  Temp:  98.7 F (37.1 C)  98.9 F (37.2 C)  TempSrc:  Oral  Oral  Resp:  19  22  Height:      Weight:   148.825 kg (328 lb 1.6 oz)   SpO2:  96%  95%    Last BM Date: 01/29/15  Intake/Output   Yesterday:  06/12 0701 - 06/13 0700 In: 920 [P.O.:920] Out: -  This shift:  Total I/O In: 240 [P.O.:240] Out: -    Physical Exam: General: Pt awake/alert/oriented x4 in no acute distress Skin: lle erythema, 2 areas about 1cm that are fluctuant and draining.     Problem List:   Principal Problem:   Cellulitis of left leg Active Problems:   Obstructive sleep apnea   Essential hypertension   Bilateral lower extremity edema   Anxiety and depression   COPD (chronic obstructive pulmonary disease)   Dyslipidemia   Diabetes with neurologic complications   Thrombocytopenia   Hypokalemia   Acute renal failure    Results:   Labs: Results for orders placed or performed during the hospital encounter of 01/27/15 (from the past 48 hour(s))  Basic metabolic panel     Status: None   Collection Time: 01/31/15  4:05 AM  Result Value Ref Range   Sodium 136 135 - 145 mmol/L   Potassium 4.1 3.5 - 5.1 mmol/L    Comment: DELTA CHECK NOTED   Chloride 102 101 - 111 mmol/L   CO2 25 22 - 32 mmol/L   Glucose, Bld 86 65 - 99 mg/dL   BUN 14 6 - 20 mg/dL   Creatinine, Ser 1.19 0.61 - 1.24 mg/dL   Calcium 8.9 8.9 - 10.3 mg/dL   GFR calc non Af Amer >60 >60 mL/min   GFR calc Af Amer >60 >60 mL/min    Comment: (NOTE) The eGFR has been calculated using the CKD EPI equation. This calculation  has not been validated in all clinical situations. eGFR's persistently <60 mL/min signify possible Chronic Kidney Disease.    Anion gap 9 5 - 15  CBC with Differential/Platelet     Status: Abnormal   Collection Time: 01/31/15  4:05 AM  Result Value Ref Range   WBC 4.7 4.0 - 10.5 K/uL   RBC 3.35 (L) 4.22 - 5.81 MIL/uL   Hemoglobin 11.3 (L) 13.0 - 17.0 g/dL   HCT 34.7 (L) 39.0 - 52.0 %   MCV 103.6 (H) 78.0 - 100.0 fL   MCH 33.7 26.0 - 34.0 pg   MCHC 32.6 30.0 - 36.0 g/dL   RDW 14.1 11.5 - 15.5 %   Platelets 107 (L) 150 - 400 K/uL    Comment: CONSISTENT WITH PREVIOUS RESULT   Neutrophils Relative % 55 43 - 77 %   Neutro Abs 2.6 1.7 - 7.7 K/uL   Lymphocytes Relative 31 12 - 46 %   Lymphs Abs 1.5 0.7 - 4.0 K/uL   Monocytes Relative 10 3 - 12 %   Monocytes Absolute 0.5  0.1 - 1.0 K/uL   Eosinophils Relative 3 0 - 5 %   Eosinophils Absolute 0.2 0.0 - 0.7 K/uL   Basophils Relative 0 0 - 1 %   Basophils Absolute 0.0 0.0 - 0.1 K/uL  Phosphorus     Status: None   Collection Time: 01/31/15  4:05 AM  Result Value Ref Range   Phosphorus 4.0 2.5 - 4.6 mg/dL  Glucose, capillary     Status: Abnormal   Collection Time: 01/31/15  8:07 AM  Result Value Ref Range   Glucose-Capillary 101 (H) 65 - 99 mg/dL  Comprehensive metabolic panel     Status: Abnormal   Collection Time: 02/01/15  4:42 AM  Result Value Ref Range   Sodium 141 135 - 145 mmol/L   Potassium 4.2 3.5 - 5.1 mmol/L   Chloride 107 101 - 111 mmol/L   CO2 25 22 - 32 mmol/L   Glucose, Bld 87 65 - 99 mg/dL   BUN 13 6 - 20 mg/dL   Creatinine, Ser 1.24 0.61 - 1.24 mg/dL   Calcium 9.3 8.9 - 10.3 mg/dL   Total Protein 6.0 (L) 6.5 - 8.1 g/dL   Albumin 3.2 (L) 3.5 - 5.0 g/dL   AST 27 15 - 41 U/L   ALT 20 17 - 63 U/L   Alkaline Phosphatase 33 (L) 38 - 126 U/L   Total Bilirubin 0.6 0.3 - 1.2 mg/dL   GFR calc non Af Amer 60 (L) >60 mL/min   GFR calc Af Amer >60 >60 mL/min    Comment: (NOTE) The eGFR has been calculated using the CKD  EPI equation. This calculation has not been validated in all clinical situations. eGFR's persistently <60 mL/min signify possible Chronic Kidney Disease.    Anion gap 9 5 - 15  Protime-INR     Status: None   Collection Time: 02/01/15  4:42 AM  Result Value Ref Range   Prothrombin Time 14.1 11.6 - 15.2 seconds   INR 1.07 0.00 - 1.49  Glucose, capillary     Status: Abnormal   Collection Time: 02/01/15  8:08 AM  Result Value Ref Range   Glucose-Capillary 103 (H) 65 - 99 mg/dL   Comment 1 Notify RN     Imaging / Studies: No results found.  Medications / Allergies:  Scheduled Meds: . atorvastatin  40 mg Oral q1800  . clonazePAM  0.5 mg Oral QHS  . famotidine  20 mg Oral BID  . fenofibrate  160 mg Oral Daily  . gabapentin  200 mg Oral QHS  . glipiZIDE  5 mg Oral Q breakfast  . labetalol  300 mg Oral BID  . linagliptin  5 mg Oral Daily  . mometasone-formoterol  2 puff Inhalation BID  . vancomycin  1,500 mg Intravenous Q12H   Continuous Infusions:  PRN Meds:.acetaminophen **OR** acetaminophen, HYDROcodone-acetaminophen, ipratropium  Antibiotics: Anti-infectives    Start     Dose/Rate Route Frequency Ordered Stop   01/31/15 1100  vancomycin (VANCOCIN) 1,500 mg in sodium chloride 0.9 % 500 mL IVPB     1,500 mg 250 mL/hr over 120 Minutes Intravenous Every 12 hours 01/31/15 1017     01/29/15 2100  vancomycin (VANCOCIN) 1,750 mg in sodium chloride 0.9 % 500 mL IVPB  Status:  Discontinued     1,750 mg 250 mL/hr over 120 Minutes Intravenous Every 24 hours 01/28/15 1939 01/31/15 1017   01/28/15 1945  vancomycin (VANCOCIN) 2,000 mg in sodium chloride 0.9 % 500 mL IVPB  2,000 mg 250 mL/hr over 120 Minutes Intravenous  Once 01/28/15 1939 01/28/15 2214   01/28/15 1400  clindamycin (CLEOCIN) capsule 600 mg  Status:  Discontinued     600 mg Oral 3 times per day 01/28/15 1324 01/28/15 1839   01/27/15 2200  clindamycin (CLEOCIN) IVPB 600 mg  Status:  Discontinued     600 mg 100 mL/hr  over 30 Minutes Intravenous 3 times per day 01/27/15 1906 01/28/15 1324   01/27/15 1430  vancomycin (VANCOCIN) IVPB 1000 mg/200 mL premix     1,000 mg 200 mL/hr over 60 Minutes Intravenous  Once 01/27/15 1422 01/27/15 1631        Assessment/Plan LLE cellulitis possible abscess -bedside I&D today -will obtain cultures.  -Vanc  Erby Pian, [facility] Surgery Pager [phone]) For consults and floor pages call [phone](7A-4:30P)  02/01/2015 11:20 AM

## 2015-02-01 NOTE — Progress Notes (Signed)
Eric [name] TDH:[id number] DOB: 03-26-1951 DOA: 01/27/2015 PCP: Cathlean Cower, MD  Brief narrative:  64 y/o ? 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 Bridgett [facility] of [facility]--Buerger's disease and poor surgical candidate admitted 01/27/15 c LE cellulitis and also incidental AKI/CKD He developed LLE swelling after a bite to his LLE ~1 week pta-refused to be seen by MD.  Since the pain got so bad he decided to come to [facility] ED His antiemetics were adjusted while he was in the hospital he grew out one bottle of gram-positive cocci and was transitioned back to IV vancomycin on that day. He developed a bleb over the left lateral aspect of the calf and both vascular surgeon saw the patient as well as general surgery, mainly because he has a significant claudication history He has initally stabilized but wound started to point and Gen surgery did I & D on 6/13  Antibiotics:  IV Vanc 6/8------>ongoing  IV clinda 6/8-->6/9   Procedures ABI Bedside I & D   Subjective   Pain is intolerable at times Cannot reliably keep legs up  No other real c/o other than Tobacco cravings   Objective    Interim History:   Telemetry:    Objective: Filed Vitals:   01/31/15 2242 02/01/15 0500 02/01/15 0606 02/01/15 1443  BP:   122/47 146/56  Pulse:   71 72  Temp: 98.7 F (37.1 C)  98.9 F (37.2 C) 98.1 F (36.7 C)  TempSrc: Oral  Oral Oral  Resp: '19  22 20  '$ Height:      Weight:  148.825 kg (328 lb 1.6 oz)    SpO2: 96%  95% 96%    Intake/Output Summary (Last 24 hours) at 02/01/15 1723 Last data filed at 02/01/15 1445  Gross per 24 hour  Intake   1400 ml  Output      0 ml  Net   1400 ml    Exam:  General: Morbid obesity, Body mass index is 44.49 kg/(m^2). Thick Neck, cannot app JVD Cardiovascular: S1-S2 no murmur rub or gallop Respiratory: Clinically clear no added sound no rales no rhonchi Abdomen: Soft  nontender nondistended but obese Skin: covered today    Data Reviewed: Basic Metabolic Panel:  Recent Labs Lab 01/27/15 1118 01/29/15 0415 01/31/15 0405 02/01/15 0442  NA 140 135 136 141  K 3.4* 3.4* 4.1 4.2  CL 101 103 102 107  CO2 '27 22 25 25  '$ GLUCOSE 109* 123* 86 87  BUN 33* '19 14 13  '$ CREATININE 1.76* 1.44* 1.19 1.24  CALCIUM 8.9 9.0 8.9 9.3  PHOS  --   --  4.0  --    Liver Function Tests:  Recent Labs Lab 01/27/15 1118 01/29/15 0415 02/01/15 0442  AST '18 21 27  '$ ALT 13* 14* 20  ALKPHOS 39 26* 33*  BILITOT 0.6 1.1 0.6  PROT 6.2* 6.1* 6.0*  ALBUMIN 3.6 3.2* 3.2*   No results for input(s): LIPASE, AMYLASE in the last 168 hours. No results for input(s): AMMONIA in the last 168 hours. CBC:  Recent Labs Lab 01/27/15 1118 01/29/15 0415 01/31/15 0405  WBC 8.4 6.9 4.7  NEUTROABS 6.4 5.1 2.6  HGB 12.8* 11.6* 11.3*  HCT 39.0 34.8* 34.7*  MCV 103.4* 102.1* 103.6*  PLT 81* 80* 107*   Cardiac Enzymes: No results for input(s): CKTOTAL, CKMB, CKMBINDEX, TROPONINI in the last 168 hours. BNP: Invalid input(s): POCBNP CBG:  Recent Labs Lab 01/28/15 2107 01/29/15 0808 01/30/15 0747 01/31/15 0807 02/01/15 0808  GLUCAP 82 86 101* 101* 103*    Recent Results (from the past 240 hour(s))  Wound culture     Status: None   Collection Time: 01/27/15 11:22 AM  Result Value Ref Range Status   Specimen Description WOUND LEFT LEG  Final   Special Requests Normal  Final   Gram Stain   Final    NO WBC SEEN NO SQUAMOUS EPITHELIAL CELLS SEEN NO ORGANISMS SEEN Performed at [facility]    Culture   Final    NO GROWTH 2 DAYS Performed at [facility]    Report Status 01/29/2015 FINAL  Final  Culture, blood (routine x 2)     Status: None   Collection Time: 01/27/15 11:45 AM  Result Value Ref Range Status   Specimen Description BLOOD LEFT ASSIST CONTROL  Final   Special Requests BAA 5CC  Final   Culture   Final    STAPHYLOCOCCUS SPECIES  (COAGULASE NEGATIVE) Note: THE SIGNIFICANCE OF ISOLATING THIS ORGANISM FROM A SINGLE SET OF BLOOD CULTURES WHEN MULTIPLE SETS ARE DRAWN IS UNCERTAIN. PLEASE NOTIFY THE MICROBIOLOGY DEPARTMENT WITHIN ONE WEEK IF SPECIATION AND SENSITIVITIES ARE REQUIRED. Note: Gram Stain Report Called to,Read Back By and Verified With: Derald Macleod @ [phone] ON [phone] BY [facility] Performed at [facility]    Report Status 01/30/2015 FINAL  Final  Culture, blood (routine x 2)     Status: None (Preliminary result)   Collection Time: 01/27/15 11:50 AM  Result Value Ref Range Status   Specimen Description BLOOD RIGHT FOREARM  Final   Special Requests BAA 5CC   Final   Culture   Final           BLOOD CULTURE RECEIVED NO GROWTH TO DATE CULTURE WILL BE HELD FOR 5 DAYS BEFORE ISSUING A FINAL NEGATIVE REPORT Performed at [facility]    Report Status PENDING  Incomplete     Studies:              All Imaging reviewed and is as per above notation   Scheduled Meds: . atorvastatin  40 mg Oral q1800  . clonazePAM  0.5 mg Oral QHS  . famotidine  20 mg Oral BID  . fenofibrate  160 mg Oral Daily  . gabapentin  200 mg Oral QHS  . glipiZIDE  5 mg Oral Q breakfast  . HYDROcodone-acetaminophen  1 tablet Oral Q4H  . labetalol  300 mg Oral BID  . lidocaine-EPINEPHrine  10 mL Infiltration Once  . linagliptin  5 mg Oral Daily  . mometasone-formoterol  2 puff Inhalation BID  . vancomycin  1,500 mg Intravenous Q12H   Continuous Infusions:    Assessment/Plan:  1. Acute purulent cellulitis of lower extremity in the setting of Buerger's disease-appreciate greatly general surgery, vascular surgery input.  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  Continue IV abx.  Pain meds . to scheduled vicodin q 4 prn 2. 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  Donnetta Hutching by Dr. Bridgett Larsson 3. Tobacco abuse-patient insistent on not needing tobacco patch-pre-contemplative 4. Mood disorder not otherwise specified- Continue clonazepam 0.5 twice a day. 5. 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  Home meds glipizide 5  mg, linagliptin 5 mg,   hold off on his metformin because of his marginal renal function for now.  Continue present plan and medications-continue glipizide and  linagliptin 10 cautiously as these are renally cleared as well-continue gabapentin cautiously 200 daily at bedtime 6. Acute kidney injury- resolved-hold chem 7's for now 7. Nonoperative moderate to severe lower extremity claudication-ABI on R sideworse than leftpatient does not have any ulceration on his lower extremities however is followed by vascular surgery Dr. Bridgett Larsson for AAA 8. Stage II Gold COPD-continue Atrovent 0.5 every 4 when necessary, Dulera 100-5 daily 9. Hyperlipidemia-continue Lipitor 40 daily, fenofibrate 160 daily 10. Hypertension continue labetalol 300 twice a day-   Code Status: Full Family Communication: Discussed with patient and wife at bedside Disposition Plan: Inpatient pending resolution-probably can discharge in 1-2 days   Verneita Griffes, MD  [facility] Pager [phone] 02/01/2015, 5:23 PM    LOS: 5 days

## 2015-02-01 NOTE — Progress Notes (Signed)
Eric [name] VPX:[id number] DOB: Mar 30, 1951 DOA: 01/27/2015 PCP: Cathlean Cower, MD  Brief narrative:  64 y/o ? 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 Bridgett [facility]sson of [facility]--Buerger's disease and poor surgical candidate admitted 01/27/15 c LE cellulitis and also incidental AKI/CKD He developed LLE swelling after a bite to his LLE ~1 week pta-refused to be seen by MD.  Since the pain got so bad he decided to come to [facility] ED His antiemetics were adjusted while he was in the hospital he grew out one bottle of gram-positive cocci and was transitioned back to IV vancomycin on that day. He developed a bleb over the left lateral aspect of the calf and both vascular surgeon saw the patient as well as general surgery, mainly because he has a significant claudication history He had I& D on 6/13 and that grew Gr + cocci   Antibiotics:  IV Vanc 6/8->6/13  IV clinda 6/8-->6/9  Po doxycycline 100 bid 6/14    Subjective   Doing fair tol diet Worried about his stomach issues and wants to know what is going on Asks if he can walk around Pain in abd ~7/10 Wants ot know if he can have his iron back   Objective    Interim History:   Telemetry:    Objective: Filed Vitals:   01/31/15 2240 01/31/15 2242 02/01/15 0500 02/01/15 0606  BP: 140/58   122/47  Pulse: 68   71  Temp:  98.7 F (37.1 C)  98.9 F (37.2 C)  TempSrc:  Oral  Oral  Resp:  19  22  Height:      Weight:   148.825 kg (328 lb 1.6 oz)   SpO2:  96%  95%    Intake/Output Summary (Last 24 hours) at 02/01/15 1212 Last data filed at 02/01/15 0840  Gross per 24 hour  Intake   1160 ml  Output      0 ml  Net   1160 ml    Exam:  General: Morbid obesity, Body mass index is 44.49 kg/(m^2). Thick Neck, cannot app JVD Cardiovascular: S1-S2 no murmur rub or gallop Respiratory: Clinically clear no added sound no rales no rhonchi Abdomen: Soft nontender  nondistended but obese Skin: red LLE edema calf--wound not exoday    Data Reviewed: Basic Metabolic Panel:  Recent Labs Lab 01/27/15 1118 01/29/15 0415 01/31/15 0405 02/01/15 0442  NA 140 135 136 141  K 3.4* 3.4* 4.1 4.2  CL 101 103 102 107  CO2 '27 22 25 25  '$ GLUCOSE 109* 123* 86 87  BUN 33* '19 14 13  '$ CREATININE 1.76* 1.44* 1.19 1.24  CALCIUM 8.9 9.0 8.9 9.3  PHOS  --   --  4.0  --    Liver Function Tests:  Recent Labs Lab 01/27/15 1118 01/29/15 0415 02/01/15 0442  AST '18 21 27  '$ ALT 13* 14* 20  ALKPHOS 39 26* 33*  BILITOT 0.6 1.1 0.6  PROT 6.2* 6.1* 6.0*  ALBUMIN 3.6 3.2* 3.2*   No results for input(s): LIPASE, AMYLASE in the last 168 hours. No results for input(s): AMMONIA in the last 168 hours. CBC:  Recent Labs Lab 01/27/15 1118 01/29/15 0415 01/31/15 0405  WBC 8.4 6.9 4.7  NEUTROABS 6.4 5.1 2.6  HGB 12.8* 11.6* 11.3*  HCT 39.0 34.8* 34.7*  MCV 103.4* 102.1* 103.6*  PLT 81* 80* 107*   Cardiac Enzymes: No results for input(s): CKTOTAL,  CKMB, CKMBINDEX, TROPONINI in the last 168 hours. BNP: Invalid input(s): POCBNP CBG:  Recent Labs Lab 01/28/15 2107 01/29/15 0808 01/30/15 0747 01/31/15 0807 02/01/15 0808  GLUCAP 82 86 101* 101* 103*    Recent Results (from the past 240 hour(s))  Wound culture     Status: None   Collection Time: 01/27/15 11:22 AM  Result Value Ref Range Status   Specimen Description WOUND LEFT LEG  Final   Special Requests Normal  Final   Gram Stain   Final    NO WBC SEEN NO SQUAMOUS EPITHELIAL CELLS SEEN NO ORGANISMS SEEN Performed at [facility]    Culture   Final    NO GROWTH 2 DAYS Performed at [facility]    Report Status 01/29/2015 FINAL  Final  Culture, blood (routine x 2)     Status: None   Collection Time: 01/27/15 11:45 AM  Result Value Ref Range Status   Specimen Description BLOOD LEFT ASSIST CONTROL  Final   Special Requests BAA 5CC  Final   Culture   Final    STAPHYLOCOCCUS  SPECIES (COAGULASE NEGATIVE) Note: THE SIGNIFICANCE OF ISOLATING THIS ORGANISM FROM A SINGLE SET OF BLOOD CULTURES WHEN MULTIPLE SETS ARE DRAWN IS UNCERTAIN. PLEASE NOTIFY THE MICROBIOLOGY DEPARTMENT WITHIN ONE WEEK IF SPECIATION AND SENSITIVITIES ARE REQUIRED. Note: Gram Stain Report Called to,Read Back By and Verified With: Derald Macleod @ [phone] ON [phone] BY [facility] Performed at [facility]    Report Status 01/30/2015 FINAL  Final  Culture, blood (routine x 2)     Status: None (Preliminary result)   Collection Time: 01/27/15 11:50 AM  Result Value Ref Range Status   Specimen Description BLOOD RIGHT FOREARM  Final   Special Requests BAA 5CC   Final   Culture   Final           BLOOD CULTURE RECEIVED NO GROWTH TO DATE CULTURE WILL BE HELD FOR 5 DAYS BEFORE ISSUING A FINAL NEGATIVE REPORT Performed at [facility]    Report Status PENDING  Incomplete     Studies:              All Imaging reviewed and is as per above notation   Scheduled Meds: . atorvastatin  40 mg Oral q1800  . clonazePAM  0.5 mg Oral QHS  . famotidine  20 mg Oral BID  . fenofibrate  160 mg Oral Daily  . gabapentin  200 mg Oral QHS  . glipiZIDE  5 mg Oral Q breakfast  . labetalol  300 mg Oral BID  . lidocaine-EPINEPHrine  10 mL Infiltration Once  . linagliptin  5 mg Oral Daily  . mometasone-formoterol  2 puff Inhalation BID  . vancomycin  1,500 mg Intravenous Q12H   Continuous Infusions:    Assessment/Plan:  1. Acute purulent cellulitis of lower extremity in the setting of Buerger's disease-appreciate greatly general surgery, vascular surgery input.  Continue vancomycin-wound I & d's at bedside by Gen surg 02/01/15 . 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 2. 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 Bridgett Larsson 3. Tobacco  abuse-patient insistent on not needing tobacco patch-pre-contemplative 4. Mood disorder not otherwise specified- Continue clonazepam 0.5 twice a day. 5. 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  Home meds glipizide 5 mg, linagliptin 5 mg,  hold off on his metformin because of his marginal renal function for now.  Continue present plan and medications-continue glipizide and  linagliptin 10 cautiously as these are renally cleared as well-continue gabapentin cautiously 200 daily at bedtime 6. Acute renal resolving, repeat labs in the morning 7. Nonoperative severe lower extremity claudication-patient does not have any ulceration on his lower extremities however is followed by vascular surgery Dr. Bridgett Larsson for AAA 8. Stage II Gold COPD-continue Atrovent 0.5 every 4 when necessary, Dulera 100-5 daily 9. Hyperlipidemia-continue Lipitor 40 daily, fenofibrate 160 daily 10. Hypertension continue labetalol 300 twice a day-  Code Status: Full Family Communication: Discussed with patient and wife at bedside Disposition Plan: Inpatient pending resolution-probably can discharge 24 hours   Verneita Griffes, MD  [facility] Pager [phone] 02/01/2015, 12:12 PM    LOS: 5 days

## 2015-02-01 NOTE — Procedures (Signed)
Incision and Drainage Procedure Note  Pre-operative Diagnosis: left lower extremity cellulitis with abscess  Post-operative Diagnosis: same  Indications: Eric [name] is a 64 year old male with a history of obesity, OSA, tobacco use and  DM found to have lower extremity cellulitis.  Despite being on Vancomycin, he failed to significantly improve.  After further examination, it was felt he had an abscess.  Anesthesia: 1% lidocaine with epinephrine  Procedure Details  The procedure, risks and complications have been discussed in detail (including, but not limited to delayed wound healing, open wound, infection, bleeding) with the patient, and the patient has verbally consented to the procedure.   The skin was sterilely prepped and draped over the affected area in the usual fashion. After adequate local anesthesia, I&D with a #11 blade was performed on the left lower extremity, lateral region. Purulent drainage: present and a wound culture was obtained.  Cavity was 1.5cmx2.5cm without additional pockets. The patient tolerated the procedure well and was observed until stable.  Wound was packed with a NS 4x4 guaze.   Findings: As above3  EBL: <10 cc's  Drains: none  Condition: Tolerated procedure well and Stable   Complications: none.   Tiane Szydlowski, ANP-BC

## 2015-02-01 NOTE — Care Management Note (Signed)
Case Management Note  Patient Details  Name: Eric [name] MRN: [id number] Date of Birth: 1951/08/15  Subjective/Objective:   Pt cont on IV Vancomycin for LLE cellulitis.  S/P I& D today.                   Action/Plan: Will follow progress.  Pt will likely need HHRN at dc for wound care/packing of wound.    Expected Discharge Date:                  Expected Discharge Plan:  [place]  In-House Referral:     Discharge planning Services  CM Consult  Post Acute Care Choice:    Choice offered to:     DME Arranged:    DME Agency:     HH Arranged:    [place] Agency:     Status of Service:  In process, will continue to follow  Medicare Important Message Given:  Yes Date Medicare IM Given:  02/01/15 Medicare IM give by:  Ellan Lambert, Rn, BSN  Date Additional Medicare IM Given:    Additional Medicare Important Message give by:     If discussed at [place] of Stay Meetings, dates discussed:    Additional Comments:  Reinaldo Raddle, RN, BSN  Trauma/Neuro ICU Case Manager [phone]

## 2015-02-02 ENCOUNTER — Telehealth: Payer: Self-pay | Admitting: Vascular Surgery

## 2015-02-02 LAB — CULTURE, BLOOD (ROUTINE X 2): CULTURE: NO GROWTH

## 2015-02-02 LAB — CBC
HEMATOCRIT: 36.8 % — AB (ref 39.0–52.0)
Hemoglobin: 11.9 g/dL — ABNORMAL LOW (ref 13.0–17.0)
MCH: 33.2 pg (ref 26.0–34.0)
MCHC: 32.3 g/dL (ref 30.0–36.0)
MCV: 102.8 fL — ABNORMAL HIGH (ref 78.0–100.0)
Platelets: 127 10*3/uL — ABNORMAL LOW (ref 150–400)
RBC: 3.58 MIL/uL — AB (ref 4.22–5.81)
RDW: 13.7 % (ref 11.5–15.5)
WBC: 4.2 10*3/uL (ref 4.0–10.5)

## 2015-02-02 LAB — GLUCOSE, CAPILLARY: Glucose-Capillary: 93 mg/dL (ref 65–99)

## 2015-02-02 LAB — VANCOMYCIN, TROUGH: Vancomycin Tr: 21 ug/mL — ABNORMAL HIGH (ref 10.0–20.0)

## 2015-02-02 MED ORDER — VANCOMYCIN HCL 10 G IV SOLR
1250.0000 mg | Freq: Two times a day (BID) | INTRAVENOUS | Status: DC
  Filled 2015-02-02: qty 1250

## 2015-02-02 MED ORDER — HYDROCODONE-ACETAMINOPHEN 5-325 MG PO TABS
1.0000 | ORAL_TABLET | ORAL | Status: DC
  Filled 2015-02-02: qty 2

## 2015-02-02 MED ORDER — DOXYCYCLINE HYCLATE 100 MG PO TABS
100.0000 mg | ORAL_TABLET | Freq: Two times a day (BID) | ORAL | Status: DC
Start: 2015-02-02 — End: 2015-02-03
  Administered 2015-02-02 – 2015-02-03 (×3): 100 mg via ORAL
  Filled 2015-02-02 (×3): qty 1

## 2015-02-02 MED ORDER — HYDROCODONE-ACETAMINOPHEN 5-325 MG PO TABS
1.0000 | ORAL_TABLET | ORAL | Status: DC | PRN
  Administered 2015-02-02 (×3): 2 via ORAL
  Filled 2015-02-02 (×2): qty 2

## 2015-02-02 MED ORDER — HYDROCODONE-ACETAMINOPHEN 5-325 MG PO TABS
1.0000 | ORAL_TABLET | ORAL | Status: DC
Start: 2015-02-02 — End: 2015-02-03
  Administered 2015-02-02 – 2015-02-03 (×6): 1 via ORAL
  Filled 2015-02-02 (×6): qty 1

## 2015-02-02 NOTE — Telephone Encounter (Signed)
-----   Message from Mena Goes, RN sent at 02/01/2015  9:54 AM EDT ----- Regarding: Schedule   ----- Message -----    From: Rosetta Posner, MD    Sent: 01/30/2015   8:57 AM      To: [facility] Charge Pool  This patient needs outpatient follow-up with Dr. Bridgett Larsson in January for one-year follow-up of small aortic aneurysm and popliteal ectasia. Needs ultrasound of abdominal aorta and popliteal arteries at his follow-up visit

## 2015-02-02 NOTE — Progress Notes (Signed)
[facility] Progress Note     Subjective: Pt says pain is still present, leg is still swollen.  Pt is sitting on the side of the bed.  Tolerating diet.  Says he cant prop his leg up due to SOB when he's in bed.  Says he can't sit in the chair due to his back hurting.    Objective: Vital signs in last 24 hours: Temp:  [97.2 F (36.2 C)-98.1 F (36.7 C)] 97.2 F (36.2 C) (06/14 0515) Pulse Rate:  [68-72] 68 (06/14 0515) Resp:  [20] 20 (06/14 0515) BP: (139-146)/(56-64) 139/59 mmHg (06/14 0515) SpO2:  [94 %-96 %] 96 % (06/14 0515) Last BM Date: 02/01/15  Intake/Output from previous day: 06/13 0701 - 06/14 0700 In: 3460 [P.O.:1961; IV Piggyback:1500] Out: 1 [Urine:1] Intake/Output this shift:    PE: Gen:  Alert, NAD Left leg:  Swelling, edema, erythema to left leg.  No further pus drainage, wound is with 90% slough.  No foul smell.  Tender to palp.  Lab Results:   Recent Labs  01/31/15 0405 02/02/15 0428  WBC 4.7 4.2  HGB 11.3* 11.9*  HCT 34.7* 36.8*  PLT 107* 127*   BMET  Recent Labs  01/31/15 0405 02/01/15 0442  NA 136 141  K 4.1 4.2  CL 102 107  CO2 25 25  GLUCOSE 86 87  BUN 14 13  CREATININE 1.19 1.24  CALCIUM 8.9 9.3   PT/INR  Recent Labs  02/01/15 0442  LABPROT 14.1  INR 1.07   CMP     Component Value Date/Time   NA 141 02/01/2015 0442   K 4.2 02/01/2015 0442   CL 107 02/01/2015 0442   CO2 25 02/01/2015 0442   GLUCOSE 87 02/01/2015 0442   BUN 13 02/01/2015 0442   CREATININE 1.24 02/01/2015 0442   CALCIUM 9.3 02/01/2015 0442   PROT 6.0* 02/01/2015 0442   ALBUMIN 3.2* 02/01/2015 0442   AST 27 02/01/2015 0442   ALT 20 02/01/2015 0442   ALKPHOS 33* 02/01/2015 0442   BILITOT 0.6 02/01/2015 0442   GFRNONAA 60* 02/01/2015 0442   GFRAA >60 02/01/2015 0442   Lipase  No results found for: LIPASE     Studies/Results: No results found.  Anti-infectives: Anti-infectives    Start     Dose/Rate Route Frequency Ordered Stop    01/31/15 1100  vancomycin (VANCOCIN) 1,500 mg in sodium chloride 0.9 % 500 mL IVPB     1,500 mg 250 mL/hr over 120 Minutes Intravenous Every 12 hours 01/31/15 1017     01/29/15 2100  vancomycin (VANCOCIN) 1,750 mg in sodium chloride 0.9 % 500 mL IVPB  Status:  Discontinued     1,750 mg 250 mL/hr over 120 Minutes Intravenous Every 24 hours 01/28/15 1939 01/31/15 1017   01/28/15 1945  vancomycin (VANCOCIN) 2,000 mg in sodium chloride 0.9 % 500 mL IVPB     2,000 mg 250 mL/hr over 120 Minutes Intravenous  Once 01/28/15 1939 01/28/15 2214   01/28/15 1400  clindamycin (CLEOCIN) capsule 600 mg  Status:  Discontinued     600 mg Oral 3 times per day 01/28/15 1324 01/28/15 1839   01/27/15 2200  clindamycin (CLEOCIN) IVPB 600 mg  Status:  Discontinued     600 mg 100 mL/hr over 30 Minutes Intravenous 3 times per day 01/27/15 1906 01/28/15 1324   01/27/15 1430  vancomycin (VANCOCIN) IVPB 1000 mg/200 mL premix     1,000 mg 200 mL/hr over 60 Minutes Intravenous  Once  01/27/15 1422 01/27/15 1631       Assessment/Plan LLE cellulitis & abscess s/p bedside I&D (ER 02/02/15) -Cultures pending - gram + cocci in clusters seen -Dressing changes WD BID -Vanc Day #7  -Would is likely to take a significant time to heal (if it does so at all) will need wound care referral at discharge and follow up with the wound center.    LOS: 6 days    Nat Christen 02/02/2015, 8:36 AM Pager: [phone]

## 2015-02-02 NOTE — Telephone Encounter (Signed)
Spoke with pt, dpm °

## 2015-02-02 NOTE — Progress Notes (Signed)
ANTIBIOTIC CONSULT NOTE - FOLLOW UP  Pharmacy Consult:  Vancomycin Indication:  CoNS bacteremia + left leg cellulitis  Allergies  Allergen Reactions  . Oxycodone Other (See Comments)    Sedation/somnolence    Patient Measurements: Height: 6' (182.9 cm) Weight: (!) 328 lb 1.6 oz (148.825 kg) (standing scale) IBW/kg (Calculated) : 77.6  Vital Signs: Temp: 97.2 F (36.2 C) (06/14 0515) Temp Source: Oral (06/14 0515) BP: 139/59 mmHg (06/14 0515) Pulse Rate: 68 (06/14 0515) Intake/Output from previous day: 06/13 0701 - 06/14 0700 In: 3460 [P.O.:1961; IV Piggyback:1500] Out: 1 [Urine:1]  Labs:  Recent Labs  01/31/15 0405 02/01/15 0442 02/02/15 0428  WBC 4.7  --  4.2  HGB 11.3*  --  11.9*  PLT 107*  --  127*  CREATININE 1.19 1.24  --    Estimated Creatinine Clearance: 90.3 mL/min (by C-G formula based on Cr of 1.24).  Recent Labs  02/02/15 1006  Rosalia 21*     Microbiology: Recent Results (from the past 720 hour(s))  Wound culture     Status: None   Collection Time: 01/27/15 11:22 AM  Result Value Ref Range Status   Specimen Description WOUND LEFT LEG  Final   Special Requests Normal  Final   Gram Stain   Final    NO WBC SEEN NO SQUAMOUS EPITHELIAL CELLS SEEN NO ORGANISMS SEEN Performed at [facility]    Culture   Final    NO GROWTH 2 DAYS Performed at [facility]    Report Status 01/29/2015 FINAL  Final  Culture, blood (routine x 2)     Status: None   Collection Time: 01/27/15 11:45 AM  Result Value Ref Range Status   Specimen Description BLOOD LEFT ASSIST CONTROL  Final   Special Requests BAA 5CC  Final   Culture   Final    STAPHYLOCOCCUS SPECIES (COAGULASE NEGATIVE) Note: THE SIGNIFICANCE OF ISOLATING THIS ORGANISM FROM A SINGLE SET OF BLOOD CULTURES WHEN MULTIPLE SETS ARE DRAWN IS UNCERTAIN. PLEASE NOTIFY THE MICROBIOLOGY DEPARTMENT WITHIN ONE WEEK IF SPECIATION AND SENSITIVITIES ARE REQUIRED. Note: Gram Stain Report Called  to,Read Back By and Verified With: Derald Macleod @ 1610 ON [phone] BY [facility] Performed at [facility]    Report Status 01/30/2015 FINAL  Final  Culture, blood (routine x 2)     Status: None   Collection Time: 01/27/15 11:50 AM  Result Value Ref Range Status   Specimen Description BLOOD RIGHT FOREARM  Final   Special Requests BAA 5CC   Final   Culture   Final    NO GROWTH 5 DAYS Performed at [facility]    Report Status 02/02/2015 FINAL  Final  Culture, routine-abscess     Status: None (Preliminary result)   Collection Time: 02/01/15  3:00 PM  Result Value Ref Range Status   Specimen Description ABSCESS LEFT LEG  Final   Special Requests NONE  Final   Gram Stain   Final    FEW WBC PRESENT, PREDOMINANTLY PMN NO SQUAMOUS EPITHELIAL CELLS SEEN FEW GRAM POSITIVE COCCI IN CLUSTERS Performed at [facility]    Culture NO GROWTH Performed at [facility]   Final   Report Status PENDING  Incomplete      Assessment: 22 YOM on vancomycin for CoNS bacteremia and left leg cellulitis, s/p I&D on 02/01/15.  Patient's renal function has been improving and is vancomycin trough is slightly supra-therapeutic.  Per RN, almost half of this morning's vancomycin dose  was given.  Vanc 6/8 >> Clinda 6/8 >> 6/9  6/14 VT = 21 mcg/mL on '1500mg'$  q12 >> '1250mg'$  q12  6/8 BCx - CoNS (1 of 2) 6/8 left leg wound cx - negative 6/13 left leg abscess cx - GPC on Gram stain   Goal of Therapy:  Vancomycin trough level 15-20 mcg/ml   Plan:  - Decrease vanc to '1250mg'$  IV Q12H, start tonight - Monitor renal fxn, clinical course, weekly vanc trough to r/o accumulation - MD, please specify whether you think BCx is contaminated as it will dictate vanc trough goal and dose.  Thanks!    Lakysha Kossman D. Mina Marble, PharmD, BCPS Pager:  [phone] 02/02/2015, 12:36 PM

## 2015-02-03 ENCOUNTER — Telehealth: Payer: Self-pay | Admitting: Internal Medicine

## 2015-02-03 LAB — GLUCOSE, CAPILLARY: Glucose-Capillary: 98 mg/dL (ref 65–99)

## 2015-02-03 MED ORDER — DOXYCYCLINE HYCLATE 100 MG PO TABS: 100.0000 mg | ORAL_TABLET | Freq: Two times a day (BID) | ORAL | Status: DC

## 2015-02-03 MED ORDER — HYDROCODONE-ACETAMINOPHEN 5-325 MG PO TABS: 1.0000 | ORAL_TABLET | ORAL | Status: DC

## 2015-02-03 MED ORDER — FLUTICASONE-SALMETEROL 250-50 MCG/DOSE IN AEPB: 1.0000 | INHALATION_SPRAY | Freq: Two times a day (BID) | RESPIRATORY_TRACT | Status: DC

## 2015-02-03 MED ORDER — LINAGLIPTIN 5 MG PO TABS: 5.0000 mg | ORAL_TABLET | Freq: Every day | ORAL | Status: DC

## 2015-02-03 NOTE — Care Management Note (Signed)
Case Management Note  Patient Details  Name: Eric [name] MRN: [id number] Date of Birth: [birth year]  Subjective/Objective:         Pt for dc home today with spouse.  He needs HHRN follow up for wound care.  He is reluctant to set this up, as he states "my wife won't like anybody in her house" and " I have two big dogs."  Explained to pt that nurse needs to come out to evaluate wound and teach someone in the home to change dressing.  He asks that I call his wife.  Spoke with [name] by [phone]):  She is agreeable to [facility] follow up, and states that dogs can be put up prior to [facility] staff arriving.  Referral to [facility], per wife's choice.  Start of care 24h post dc date.             Action/Plan: Notified [facility] of need to call pt prior to arrival to pt home, as he has 2 large dogs, and they will need to be contained prior to their visit for safety.  Pt notified of my conversation with his wife.    Expected Discharge Date:   02/03/15               Expected Discharge Plan:  [place]  In-House Referral:     Discharge planning Services  CM Consult  Post Acute Care Choice:  Home Health Choice offered to:  Spouse  DME Arranged:    DME Agency:     HH Arranged:  RN [place] Agency:  [place]  Status of Service:  In process, will continue to follow  Medicare Important Message Given:  Yes Date Medicare IM Given:  02/01/15 Medicare IM give by:  Ellan Lambert, Rn, BSN  Date Additional Medicare IM Given:    Additional Medicare Important Message give by:     If discussed at [place] of Stay Meetings, dates discussed:    Additional Comments:  Reinaldo Raddle, RN, BSN  Trauma/Neuro ICU Case Manager [phone]

## 2015-02-03 NOTE — Discharge Summary (Signed)
Physician Discharge Summary  Eric [name] VPX:[id number] DOB: 1950/11/19 DOA: 01/27/2015  PCP: Cathlean Cower, MD  Admit date: 01/27/2015 Discharge date: 02/03/2015  Time spent: 35 minutes  Recommendations for Outpatient Follow-up:  1. Needs follow up c PCP ~ 1 week 2. Smoking cessation counseled significantly this [facility] stay 3. Needs OP follow up Dr. Donney Dice for his mod Buerger's disease 4. Needs weight loss counselling 5. Dress wound with wet-dry dressings daily 6.  to meds as below Order on Discharge  - doxycycline (VIBRA-TABS) 100 MG tablet  - Fluticasone-Salmeterol (ADVAIR DISKUS) 250-50 MCG/DOSE AEPB\  - HYDROcodone-acetaminophen (NORCO/VICODIN) 5-325 MG per tablet [limited Rx given as post-op]  - linagliptin (TRADJENTA) 5 MG TABS tablet  Stop Taking on Discharge  - irbesartan (AVAPRO) 300 MG tablet [stopped 2/2 to AKI this admit - pioglitazone (ACTOS) 30 MG tablet [stoppped because of assosciated incidence Heart failure with this class of meds]   Discharge Diagnoses:  Principal Problem:   Cellulitis of left leg Active Problems:   Obstructive sleep apnea   Essential hypertension   Bilateral lower extremity edema   Anxiety and depression   COPD (chronic obstructive pulmonary disease)   Dyslipidemia   Diabetes with neurologic complications   Thrombocytopenia   Hypokalemia   Acute renal failure   Discharge Condition: fair  Diet recommendation:  hh low salt  Filed Weights   01/30/15 0520 01/31/15 0601 02/01/15 0500  Weight: 150.277 kg (331 lb 4.8 oz) 152 kg (335 lb 1.6 oz) 148.825 kg (328 lb 1.6 oz)    Hospital Course:  64 y/o ? 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 Bridgett Larsson of [facility]--Buerger's disease and poor surgical candidate admitted 01/27/15 c LE cellulitis and also incidental AKI/CKD He developed LLE swelling after a bite to his LLE ~1 week pta-refused to be seen by MD. Since the pain  got so bad he decided to come to [facility] ED His antiemetics were adjusted while he was in the hospital he grew out one bottle of gram-positive cocci and was transitioned back to IV vancomycin on that day. He developed a bleb over the left lateral aspect of the calf and both vascular surgeon saw the patient as well as general surgery, mainly because he has a significant claudication history He has initally stabilized but wound started to point and Gen surgery did I & D on 02/01/15   1. Acute purulent cellulitis of lower extremity in the setting of Buerger's disease-appreciate greatly general surgery, vascular surgery input. 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  IV abx  2. . Pain meds . to scheduled vicodin q 4 prn 3. 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  Donnetta Hutching by Dr. Bridgett Larsson 4. Tobacco abuse-patient insistent on not needing tobacco patch-pre-contemplative 5. Mood disorder not otherwise specified- Continue clonazepam 0.5 twice a day. 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 Home meds glipizide 5 mg, linagliptin 5 mg, hold off on his metformin because of his marginal renal function for now. Continue present plan and medications-continue glipizide and linagliptin 10 cautiously as these are renally cleared as well-continue gabapentin cautiously 200 daily at bedtime.  Evidence Pioglitazone has assosicative CHF risk ?d/c this admission 6. Acute kidney injury- resolved-hold chem 7's for  now-ARB d/c on d/c hom 7. Nonoperative moderate to severe lower extremity claudication-ABI on R sideworse than leftpatient does not have any ulceration on his lower extremities however is followed by vascular surgery Dr. Bridgett Larsson for AAA 8. Stage II Gold COPD-continue Atrovent 0.5  every 4 when necessary, Dulera 100-5 daily 9. Hyperlipidemia-continue Lipitor 40 daily, fenofibrate 160 daily 10. Hypertension continue labetalol 300 twice a day-  Antibiotics: IV Vanc 6/8------>ongoing IV clinda 6/8-->6/9 Doxycycline 6/14---6/21   Procedures ABI Bedside I & D  Discharge Exam: Filed Vitals:   02/03/15 0557  BP: 134/63  Pulse: 74  Temp: 98.7 F (37.1 C)  Resp: 20    General: eomi, ncat Cardiovascular: s1 s2 no m/r/g Respiratory: clear  Discharge Instructions   Discharge Instructions    Change dressing (specify)    Complete by:  As directed   Dressing change: 1x per day  times per day using wet-dry dressings.     Diet - low sodium heart healthy    Complete by:  As directed      Discharge instructions    Complete by:  As directed   You will need another 7 days or Oral Doxycycline-please take all of the tablets Rx A wound nurse will come out to help  You in the up coming days--follow with your primary MD for follow-up We will r/x a limited amount of Pain meds for you Please stop smoking Good luck and take care     Increase activity slowly    Complete by:  As directed           Current Discharge Medication List    START taking these medications   Details  doxycycline (VIBRA-TABS) 100 MG tablet Take 1 tablet (100 mg total) by mouth every 12 (twelve) hours. Qty: 14 tablet, Refills: 0    HYDROcodone-acetaminophen (NORCO/VICODIN) 5-325 MG per tablet Take 1 tablet by mouth every 4 (four) hours. Qty: 30 tablet, Refills: 0      CONTINUE these medications which have CHANGED   Details  Fluticasone-Salmeterol (ADVAIR DISKUS) 250-50 MCG/DOSE AEPB Inhale 1 puff into the lungs 2 (two) times daily. Qty: 1 each, Refills: 11    linagliptin (TRADJENTA) 5 MG TABS tablet Take 1 tablet (5 mg total) by mouth daily. Qty: 90 tablet, Refills: 3      CONTINUE these medications which have NOT CHANGED   Details  aspirin 81 MG tablet Take 81 mg by mouth daily.       Bisacodyl (LAXATIVE PO) Take 1 capsule by mouth daily.    Blood Glucose Monitoring Suppl DEVI 1 Device by Does not apply route daily. Qty: 1 each, Refills: 0    clonazePAM (KLONOPIN) 0.5 MG tablet Take 1-2 tablets (0.5-1 mg total) by mouth at bedtime. Pt can take up to 2 depending on anxiety level Qty: 60 tablet, Refills: 5    fenofibrate 160 MG tablet Take 1 tablet (160 mg total) by mouth daily. Qty: 30 tablet, Refills: 11    furosemide (LASIX) 20 MG tablet Take 0.5 tablets (10 mg total) by mouth daily. Qty: 30 tablet, Refills: 1    gabapentin (NEURONTIN) 100 MG capsule Take 2 capsules (200 mg total) by mouth at bedtime. Qty: 60 capsule, Refills: 3    glipiZIDE (GLUCOTROL XL) 5 MG 24 hr tablet Take 1 tablet (5 mg total) by mouth daily with breakfast. Qty: 90 tablet, Refills: 3    glucose blood test strip Use as instructed Qty: 100 each, Refills: 12    hydrochlorothiazide (HYDRODIURIL)  25 MG tablet Take 1 tablet (25 mg total) by mouth daily. Qty: 30 tablet, Refills: 11   Associated Diagnoses: Peripheral edema    labetalol (NORMODYNE) 300 MG tablet Take 1 tablet (300 mg total) by mouth 2 (two) times daily. Qty: 60 tablet, Refills: 11    Lancets (ACCU-CHEK SOFT TOUCH) lancets Use as instructed Qty: 100 each, Refills: 12    metFORMIN (GLUCOPHAGE) 1000 MG tablet Take 1 tablet (1,000 mg total) by mouth 2 (two) times daily with a meal. Qty: 60 tablet, Refills: 11    simvastatin (ZOCOR) 80 MG tablet Take 1 tablet (80 mg total) by mouth at bedtime. Qty: 30 tablet, Refills: 2      STOP taking these medications     irbesartan (AVAPRO) 300 MG tablet      pioglitazone (ACTOS) 30 MG tablet      famotidine (PEPCID) 20 MG tablet        Allergies  Allergen Reactions  . Oxycodone Other (See Comments)    Sedation/somnolence      The results of significant diagnostics from this hospitalization (including imaging, microbiology, ancillary and laboratory) are listed below for  reference.    Significant Diagnostic Studies: Ct Tibia Fibula Left W Contrast  01/29/2015   CLINICAL DATA:  Redness/swelling of left lower extremity, cellulitis  EXAM: CT OF THE LEFT TIBIA AND FIBULA WITH CONTRAST  TECHNIQUE: Axial CT images of the left lower extremity were obtained following contrast administration. Sagittal and coronal reformatted images were reconstructed.  CONTRAST:  48m OMNIPAQUE IOHEXOL 300 MG/ML  SOLN  COMPARISON:  None.  FINDINGS: No fracture or dislocation is seen.  Soft tissue swelling/subcutaneous edema in the left lower extremity starting at the level of the mid calf and extending to the ankle, compatible with the history of cellulitis.  No evidence of drainable fluid collection/abscess.  No evidence of soft tissue gas.  No cortical osseous destruction on CT.  Incidentally noted is fusiform aneurysmal dilatation of the popliteal artery, measuring up to 1.6 cm (series 4/image 9).  IMPRESSION: Soft tissue swelling/subcutaneous edema extending from the mid calf to the ankle, compatible with the history of cellulitis.  No evidence of abscess.  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 Consider ultrasound screening for AAA as clinically warranted.   Electronically Signed   By: SJulian HyM.D.   On: 01/29/2015 20:01    Microbiology: Recent Results (from the past 240 hour(s))  Wound culture     Status: None   Collection Time: 01/27/15 11:22 AM  Result Value Ref Range Status   Specimen Description WOUND LEFT LEG  Final   Special Requests Normal  Final   Gram Stain   Final    NO WBC SEEN NO SQUAMOUS EPITHELIAL CELLS SEEN NO ORGANISMS SEEN [facility]formed at [facility]   Culture   Final    NO GROWTH 2 DAYS Performed at [facility]   Report Status 01/29/2015 FINAL  Final  Culture, blood (routine x 2)     Status: None   Collection Time: 01/27/15 11:45 AM  Result Value Ref Range Status   Specimen Description  BLOOD LEFT ASSIST CONTROL  Final   Special Requests BAA 5CC  Final   Culture   Final    STAPHYLOCOCCUS SPECIES (COAGULASE NEGATIVE) Note: THE SIGNIFICANCE OF ISOLATING THIS ORGANISM FROM A SINGLE SET OF BLOOD CULTURES WHEN MULTIPLE SETS ARE DRAWN IS UNCERTAIN. PLEASE NOTIFY THE MICROBIOLOGY DEPARTMENT WITHIN ONE WEEK  IF SPECIATION AND SENSITIVITIES ARE REQUIRED. Note: Gram Stain Report Called to,Read Back By and Verified With: Derald Macleod @ 2094 ON [phone] BY [facility] Performed at [facility]    Report Status 01/30/2015 FINAL  Final  Culture, blood (routine x 2)     Status: None   Collection Time: 01/27/15 11:50 AM  Result Value Ref Range Status   Specimen Description BLOOD RIGHT FOREARM  Final   Special Requests BAA 5CC   Final   Culture   Final    NO GROWTH 5 DAYS Performed at [facility]    Report Status 02/02/2015 FINAL  Final  Culture, routine-abscess     Status: None (Preliminary result)   Collection Time: 02/01/15  3:00 PM  Result Value Ref Range Status   Specimen Description ABSCESS LEFT LEG  Final   Special Requests NONE  Final   Gram Stain   Final    FEW WBC PRESENT, PREDOMINANTLY PMN NO SQUAMOUS EPITHELIAL CELLS SEEN FEW GRAM POSITIVE COCCI IN CLUSTERS Performed at [facility]    Culture   Final    MODERATE STAPHYLOCOCCUS AUREUS Note: RIFAMPIN AND GENTAMICIN SHOULD NOT BE USED AS SINGLE DRUGS FOR TREATMENT OF STAPH INFECTIONS. Performed at [facility]    Report Status PENDING  Incomplete     Labs: Basic Metabolic Panel:  Recent Labs Lab 01/29/15 0415 01/31/15 0405 02/01/15 0442  NA 135 136 141  K 3.4* 4.1 4.2  CL 103 102 107  CO2 '22 25 25  '$ GLUCOSE 123* 86 87  BUN '19 14 13  '$ CREATININE 1.44* 1.19 1.24  CALCIUM 9.0 8.9 9.3  PHOS  --  4.0  --    Liver Function Tests:  Recent Labs Lab 01/29/15 0415 02/01/15 0442  AST 21 27  ALT 14* 20  ALKPHOS 26* 33*  BILITOT 1.1 0.6  PROT 6.1* 6.0*  ALBUMIN 3.2* 3.2*   No  results for input(s): LIPASE, AMYLASE in the last 168 hours. No results for input(s): AMMONIA in the last 168 hours. CBC:  Recent Labs Lab 01/29/15 0415 01/31/15 0405 02/02/15 0428  WBC 6.9 4.7 4.2  NEUTROABS 5.1 2.6  --   HGB 11.6* 11.3* 11.9*  HCT 34.8* 34.7* 36.8*  MCV 102.1* 103.6* 102.8*  PLT 80* 107* 127*   Cardiac Enzymes: No results for input(s): CKTOTAL, CKMB, CKMBINDEX, TROPONINI in the last 168 hours. BNP: BNP (last 3 results) No results for input(s): BNP in the last 8760 hours.  ProBNP (last 3 results) No results for input(s): PROBNP in the last 8760 hours.  CBG:  Recent Labs Lab 01/30/15 0747 01/31/15 0807 02/01/15 0808 02/02/15 0741 02/03/15 0716  GLUCAP 101* 101* 103* 93 98       Signed:  Calie Buttrey, [place]  [facility] 02/03/2015, 12:27 PM

## 2015-02-03 NOTE — Telephone Encounter (Signed)
Pt wife called in and several question about the meds that pt is on and she went to pharmacy [facility] and it was $95.00 for 30day and they can not afford that.     Best number [phone]

## 2015-02-03 NOTE — Progress Notes (Signed)
Discharge instructions reviewed with pt and pt's wife and prescriptions given.  Pt and pt's wife verbalized understanding and had no questions.  Pt discharged in stable condition via wheelchair with wife.  Eric [name]

## 2015-02-03 NOTE — Progress Notes (Signed)
[facility] Progress Note     Subjective: Pt c/o a lot of pressure in the left leg.  He doesn't think its improved much.  He's in bed with legs up currently.  No N/V, tolerating diet.  Tolerating dressing changes.  Objective: Vital signs in last 24 hours: Temp:  [98.2 F (36.8 C)-98.7 F (37.1 C)] 98.7 F (37.1 C) (06/15 0557) Pulse Rate:  [66-74] 74 (06/15 0557) Resp:  [20-22] 20 (06/15 0557) BP: (127-152)/(63-93) 134/63 mmHg (06/15 0557) SpO2:  [96 %] 96 % (06/15 0557) Last BM Date: 02/02/15  Intake/Output from previous day: 06/14 0701 - 06/15 0700 In: 480 [P.O.:480] Out: 7 [Urine:6; Stool:1] Intake/Output this shift:    PE: Gen: Alert, NAD Left leg: Swelling, edema, erythema to left leg. Dependent edema, induration and rubor.  No further pus drainage, wound is with 90% slough. No foul smell. Tender to palp.   Lab Results:   Recent Labs  02/02/15 0428  WBC 4.2  HGB 11.9*  HCT 36.8*  PLT 127*   BMET  Recent Labs  02/01/15 0442  NA 141  K 4.2  CL 107  CO2 25  GLUCOSE 87  BUN 13  CREATININE 1.24  CALCIUM 9.3   PT/INR  Recent Labs  02/01/15 0442  LABPROT 14.1  INR 1.07   CMP     Component Value Date/Time   NA 141 02/01/2015 0442   K 4.2 02/01/2015 0442   CL 107 02/01/2015 0442   CO2 25 02/01/2015 0442   GLUCOSE 87 02/01/2015 0442   BUN 13 02/01/2015 0442   CREATININE 1.24 02/01/2015 0442   CALCIUM 9.3 02/01/2015 0442   PROT 6.0* 02/01/2015 0442   ALBUMIN 3.2* 02/01/2015 0442   AST 27 02/01/2015 0442   ALT 20 02/01/2015 0442   ALKPHOS 33* 02/01/2015 0442   BILITOT 0.6 02/01/2015 0442   GFRNONAA 60* 02/01/2015 0442   GFRAA >60 02/01/2015 0442   Lipase  No results found for: LIPASE     Studies/Results: No results found.  Anti-infectives: Anti-infectives    Start     Dose/Rate Route Frequency Ordered Stop   02/02/15 2300  vancomycin (VANCOCIN) 1,250 mg in sodium chloride 0.9 % 250 mL IVPB  Status:  Discontinued      1,250 mg 166.7 mL/hr over 90 Minutes Intravenous Every 12 hours 02/02/15 1238 02/02/15 1515   02/02/15 1530  doxycycline (VIBRA-TABS) tablet 100 mg     100 mg Oral Every 12 hours 02/02/15 1515     01/31/15 1100  vancomycin (VANCOCIN) 1,500 mg in sodium chloride 0.9 % 500 mL IVPB  Status:  Discontinued     1,500 mg 250 mL/hr over 120 Minutes Intravenous Every 12 hours 01/31/15 1017 02/02/15 1237   01/29/15 2100  vancomycin (VANCOCIN) 1,750 mg in sodium chloride 0.9 % 500 mL IVPB  Status:  Discontinued     1,750 mg 250 mL/hr over 120 Minutes Intravenous Every 24 hours 01/28/15 1939 01/31/15 1017   01/28/15 1945  vancomycin (VANCOCIN) 2,000 mg in sodium chloride 0.9 % 500 mL IVPB     2,000 mg 250 mL/hr over 120 Minutes Intravenous  Once 01/28/15 1939 01/28/15 2214   01/28/15 1400  clindamycin (CLEOCIN) capsule 600 mg  Status:  Discontinued     600 mg Oral 3 times per day 01/28/15 1324 01/28/15 1839   01/27/15 2200  clindamycin (CLEOCIN) IVPB 600 mg  Status:  Discontinued     600 mg 100 mL/hr over 30 Minutes Intravenous 3  times per day 01/27/15 1906 01/28/15 1324   01/27/15 1430  vancomycin (VANCOCIN) IVPB 1000 mg/200 mL premix     1,000 mg 200 mL/hr over 60 Minutes Intravenous  Once 01/27/15 1422 01/27/15 1631       Assessment/Plan LLE cellulitis & abscess s/p bedside I&D (ER 02/02/15) -Gram + cocci in clusters seen, staph aureus isolated, sensitivities pending -Dressing changes WD BID -Vanc Day #8  -Would is likely to take a significant time to heal (if it does so at all) will need wound care referral at discharge and follow up with the wound center. -Elevate leg and or compression stockings etc may be helpful    LOS: 7 days    Nat Christen 02/03/2015, 8:46 AM Pager: [phone]

## 2015-02-04 DIAGNOSIS — E876 Hypokalemia: Secondary | ICD-10-CM | POA: Diagnosis not present

## 2015-02-04 DIAGNOSIS — E11628 Type 2 diabetes mellitus with other skin complications: Secondary | ICD-10-CM | POA: Diagnosis not present

## 2015-02-04 DIAGNOSIS — F418 Other specified anxiety disorders: Secondary | ICD-10-CM | POA: Diagnosis not present

## 2015-02-04 DIAGNOSIS — G4733 Obstructive sleep apnea (adult) (pediatric): Secondary | ICD-10-CM | POA: Diagnosis not present

## 2015-02-04 DIAGNOSIS — L03116 Cellulitis of left lower limb: Secondary | ICD-10-CM | POA: Diagnosis not present

## 2015-02-04 DIAGNOSIS — E1149 Type 2 diabetes mellitus with other diabetic neurological complication: Secondary | ICD-10-CM | POA: Diagnosis not present

## 2015-02-04 DIAGNOSIS — J449 Chronic obstructive pulmonary disease, unspecified: Secondary | ICD-10-CM | POA: Diagnosis not present

## 2015-02-04 DIAGNOSIS — I10 Essential (primary) hypertension: Secondary | ICD-10-CM | POA: Diagnosis not present

## 2015-02-04 DIAGNOSIS — Z4800 Encounter for change or removal of nonsurgical wound dressing: Secondary | ICD-10-CM | POA: Diagnosis not present

## 2015-02-04 LAB — CULTURE, ROUTINE-ABSCESS

## 2015-02-04 NOTE — Telephone Encounter (Signed)
Pt advised.

## 2015-02-04 NOTE — Telephone Encounter (Signed)
[place] for this, as most recent renal fxn was ok

## 2015-02-04 NOTE — Telephone Encounter (Signed)
Spouse is requesting to allow pt to continue with Metformin '2000mg'$  qd until his appointment 06/22 due to the cost of Tradjenta, please advise

## 2015-02-09 DIAGNOSIS — E11628 Type 2 diabetes mellitus with other skin complications: Secondary | ICD-10-CM | POA: Diagnosis not present

## 2015-02-09 DIAGNOSIS — E1149 Type 2 diabetes mellitus with other diabetic neurological complication: Secondary | ICD-10-CM | POA: Diagnosis not present

## 2015-02-09 DIAGNOSIS — E876 Hypokalemia: Secondary | ICD-10-CM | POA: Diagnosis not present

## 2015-02-09 DIAGNOSIS — Z4800 Encounter for change or removal of nonsurgical wound dressing: Secondary | ICD-10-CM | POA: Diagnosis not present

## 2015-02-09 DIAGNOSIS — J449 Chronic obstructive pulmonary disease, unspecified: Secondary | ICD-10-CM | POA: Diagnosis not present

## 2015-02-09 DIAGNOSIS — F418 Other specified anxiety disorders: Secondary | ICD-10-CM | POA: Diagnosis not present

## 2015-02-09 DIAGNOSIS — I10 Essential (primary) hypertension: Secondary | ICD-10-CM | POA: Diagnosis not present

## 2015-02-09 DIAGNOSIS — L03116 Cellulitis of left lower limb: Secondary | ICD-10-CM | POA: Diagnosis not present

## 2015-02-09 DIAGNOSIS — G4733 Obstructive sleep apnea (adult) (pediatric): Secondary | ICD-10-CM | POA: Diagnosis not present

## 2015-02-10 ENCOUNTER — Ambulatory Visit (INDEPENDENT_AMBULATORY_CARE_PROVIDER_SITE_OTHER): Payer: Medicare Other | Admitting: Internal Medicine

## 2015-02-10 ENCOUNTER — Encounter: Payer: Self-pay | Admitting: Internal Medicine

## 2015-02-10 VITALS — BP 118/60 | HR 93 | Temp 97.9°F | Ht 72.0 in | Wt 321.0 lb

## 2015-02-10 DIAGNOSIS — E1140 Type 2 diabetes mellitus with diabetic neuropathy, unspecified: Secondary | ICD-10-CM | POA: Diagnosis not present

## 2015-02-10 DIAGNOSIS — N179 Acute kidney failure, unspecified: Secondary | ICD-10-CM | POA: Diagnosis not present

## 2015-02-10 DIAGNOSIS — L03116 Cellulitis of left lower limb: Secondary | ICD-10-CM | POA: Diagnosis not present

## 2015-02-10 DIAGNOSIS — Z0000 Encounter for general adult medical examination without abnormal findings: Secondary | ICD-10-CM

## 2015-02-10 DIAGNOSIS — Z0001 Encounter for general adult medical examination with abnormal findings: Secondary | ICD-10-CM | POA: Insufficient documentation

## 2015-02-10 DIAGNOSIS — I10 Essential (primary) hypertension: Secondary | ICD-10-CM

## 2015-02-10 MED ORDER — GLUCOSE BLOOD VI STRP: ORAL_STRIP | Status: AC

## 2015-02-10 MED ORDER — PIOGLITAZONE HCL 15 MG PO TABS: 15.0000 mg | ORAL_TABLET | Freq: Every day | ORAL | Status: DC

## 2015-02-10 MED ORDER — BLOOD GLUCOSE MONITORING SUPPL DEVI: 1.0000 | Freq: Every day | Status: AC

## 2015-02-10 MED ORDER — GLUCOSE BLOOD VI STRP: ORAL_STRIP | Status: DC

## 2015-02-10 NOTE — Patient Instructions (Signed)
OK to stay off the irbesartan for now  OK to not take the tradjenta  OK to decrease the actos and re-start at 15 mg daily  Please continue all other medications as before, and refills have been done if requested.  Please have the pharmacy call with any other refills you may need.  Please continue your efforts at being more active, low cholesterol diet, and weight control.  You are otherwise up to date with prevention measures today.  Please keep your appointments with your specialists as you may have planned  No further lab work needed today  Continue your home health wound care; you should not need antibiotics further at this time  Please return in 3 months, or sooner if needed

## 2015-02-10 NOTE — Assessment & Plan Note (Signed)
Resolved at recent d/c,  to f/u any worsening symptoms or concerns, pt and wife reassured

## 2015-02-10 NOTE — Assessment & Plan Note (Signed)
With excellent a1c, but unable to afford tradjenta, ok for lower dose actos at 15 mg aily, cont diet, f/u 6 mo, cont all other tx

## 2015-02-10 NOTE — Progress Notes (Signed)
Pre visit review using our clinic review tool, if applicable. No additional management support is needed unless otherwise documented below in the visit note. 

## 2015-02-10 NOTE — Assessment & Plan Note (Signed)
Ok to stay off the irbesartan for now,  to f/u any worsening symptoms or concerns BP Readings from Last 3 Encounters:  02/10/15 118/60  02/03/15 116/80  09/28/14 122/66

## 2015-02-10 NOTE — Addendum Note (Signed)
Addended by: Biagio Borg on: 02/10/2015 04:08 PM   Modules accepted: Orders

## 2015-02-10 NOTE — Assessment & Plan Note (Signed)
Resolved,  to f/u any worsening symptoms or concerns  

## 2015-02-10 NOTE — Assessment & Plan Note (Addendum)

## 2015-02-10 NOTE — Progress Notes (Signed)
Subjective:    Patient ID: Eric [name], male    DOB: [birth year], 64 y.o.   MRN: [id number]  HPI  Here for wellness and f/u;  Overall doing ok;  Pt denies Chest pain, worsening SOB, DOE, wheezing, orthopnea, PND, worsening LE edema, palpitations, dizziness or syncope.  Pt denies neurological change such as new headache, facial or extremity weakness.  Pt denies polydipsia, polyuria, or low sugar symptoms. Pt states overall good compliance with treatment and medications, good tolerability, and has been trying to follow appropriate diet.  Pt denies worsening depressive symptoms, suicidal ideation or panic. No fever, night sweats, wt loss, loss of appetite, or other constitutional symptoms.  Pt states good ability with ADL's, has low fall risk, home safety reviewed and adequate, no other significant changes in hearing or vision, and only occasionally active with exercise.   Cannot afford tradjenta, asks for re-start actos.  Declines further pain medication.  Also with LLE cellulitis, now resolved, but still with left prox lateral wound approx 1 cm x .5 cm, .5 cm deep, with HH for wound care.Finished doxy yesterday, Not taking actos 30 mg,  Not taking irbesartan Past Medical History  Diagnosis Date  . ALLERGIC RHINITIS 06/24/2007  . 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 ANXIETY 06/24/2007  . CELLULITIS AND ABSCESS OF LEG EXCEPT FOOT 03/28/2010  . COLONIC POLYPS, HX OF 06/24/2007  . CONSTIPATION, CHRONIC, HX OF 06/24/2007  . DEPRESSION 06/24/2007  . DIVERTICULOSIS, COLON 06/24/2007  . HYPERLIPIDEMIA 06/24/2007  . HYPERTENSION 06/24/2007  . INSOMNIA-SLEEP DISORDER-UNSPEC 01/24/2010  . SKIN LESION 01/24/2010  . Unspecified Peripheral Vascular Disease 06/24/2007  . Claudication 06/02/2011  . Coronary artery disease   . COPD (chronic obstructive pulmonary disease)   . DVT (deep venous thrombosis)   . Complication of anesthesia     "they can't put me asleep cause I don't wake up" (01/27/2015)  . OSA (obstructive sleep apnea)     . Heart murmur   . CHF (congestive heart failure)   . Myocardial infarction 1990's X 1    "w/stroke"  . CVA (cerebral vascular accident) 1990's    "w/MI"; denies residual on 01/27/2015  . DIABETES MELLITUS, TYPE II dx'd 2015  . Migraine     "comes over my right eye q now and then" (01/27/2015)  . Arthritis     "hands, knees, hips, neck; q part of the body" (01/27/2015)  . Chronic lower back pain     "I have a steel rod in my back"  . Cellulitis of left leg 01/27/2015   Past Surgical History  Procedure Laterality Date  . Anterior cervical decomp/discectomy fusion  1985    "job related injury"  . Knee arthroscopy Bilateral 1990  . Ligament repair Right 1999    "wrist; job related injury"  . Lumbar laminectomy  1990's  . Back surgery      reports that he has been smoking Cigars.  He has never used smokeless tobacco. He reports that he drinks alcohol. He reports that he does not use illicit drugs. family history includes COPD in his father; Cancer in his mother; Heart attack in his brother; Heart disease in his brother, father, maternal grandfather, and maternal grandmother; Hypertension in his brother; Peripheral vascular disease in his brother. Allergies  Allergen Reactions  . Oxycodone Other (See Comments)    Sedation/somnolence   Current Outpatient Prescriptions on File Prior to Visit  Medication Sig Dispense Refill  . aspirin 81 MG tablet Take 81 mg by mouth daily.      Marland Kitchen  Bisacodyl (LAXATIVE PO) Take 1 capsule by mouth daily.    . clonazePAM (KLONOPIN) 0.5 MG tablet Take 1-2 tablets (0.5-1 mg total) by mouth at bedtime. Pt can take up to 2 depending on anxiety level 60 tablet 5  . fenofibrate 160 MG tablet Take 1 tablet (160 mg total) by mouth daily. 30 tablet 11  . furosemide (LASIX) 20 MG tablet Take 0.5 tablets (10 mg total) by mouth daily. 30 tablet 1  . gabapentin (NEURONTIN) 100 MG capsule Take 2 capsules (200 mg total) by mouth at bedtime. 60 capsule 3  . glipiZIDE  (GLUCOTROL XL) 5 MG 24 hr tablet Take 1 tablet (5 mg total) by mouth daily with breakfast. 90 tablet 3  . hydrochlorothiazide (HYDRODIURIL) 25 MG tablet Take 1 tablet (25 mg total) by mouth daily. 30 tablet 11  . HYDROcodone-acetaminophen (NORCO/VICODIN) 5-325 MG per tablet Take 1 tablet by mouth every 4 (four) hours. 30 tablet 0  . labetalol (NORMODYNE) 300 MG tablet Take 1 tablet (300 mg total) by mouth 2 (two) times daily. 60 tablet 11  . metFORMIN (GLUCOPHAGE) 1000 MG tablet Take 1 tablet (1,000 mg total) by mouth 2 (two) times daily with a meal. 60 tablet 11  . simvastatin (ZOCOR) 80 MG tablet Take 1 tablet (80 mg total) by mouth at bedtime. 30 tablet 2  . doxycycline (VIBRA-TABS) 100 MG tablet Take 1 tablet (100 mg total) by mouth every 12 (twelve) hours. (Patient not taking: Reported on 02/10/2015) 14 tablet 0  . Fluticasone-Salmeterol (ADVAIR DISKUS) 250-50 MCG/DOSE AEPB Inhale 1 puff into the lungs 2 (two) times daily. (Patient not taking: Reported on 02/10/2015) 1 each 11  . Lancets (ACCU-CHEK SOFT TOUCH) lancets Use as instructed (Patient not taking: Reported on 02/10/2015) 100 each 12   No current facility-administered medications on file prior to visit.    Review of Systems Constitutional: Negative for increased diaphoresis, other activity, appetite or siginficant weight change other than noted HENT: Negative for worsening hearing loss, ear pain, facial swelling, mouth sores and neck stiffness.   Eyes: Negative for other worsening pain, redness or visual disturbance.  Respiratory: Negative for shortness of breath and wheezing  Cardiovascular: Negative for chest pain and palpitations.  Gastrointestinal: Negative for diarrhea, blood in stool, abdominal distention or other pain Genitourinary: Negative for hematuria, flank pain or change in urine volume.  Musculoskeletal: Negative for myalgias or other joint complaints.  Skin: Negative for color change and wound or drainage.   Neurological: Negative for syncope and numbness. other than noted Hematological: Negative for adenopathy. or other swelling Psychiatric/Behavioral: Negative for hallucinations, SI, self-injury, decreased concentration or other worsening agitation.      Objective:   Physical Exam BP 118/60 mmHg  Pulse 93  Temp(Src) 97.9 F (36.6 C) (Oral)  Ht 6' (1.829 m)  Wt 321 lb (145.605 kg)  BMI 43.53 kg/m2  SpO2 94% VS noted,  Constitutional: Pt is oriented to person, place, and time. Appears well-developed and well-nourished, in no significant distress Head: Normocephalic and atraumatic.  Right Ear: External ear normal.  Left Ear: External ear normal.  Nose: Nose normal.  Mouth/Throat: Oropharynx is clear and moist.  Eyes: Conjunctivae and EOM are normal. Pupils are equal, round, and reactive to light.  Neck: Normal range of motion. Neck supple. No JVD present. No tracheal deviation present or significant neck LA or mass Cardiovascular: Normal rate, regular rhythm, normal heart sounds and intact distal pulses.   Pulmonary/Chest: Effort normal and breath sounds without rales or wheezing  Abdominal: Soft. Bowel sounds are normal. NT. No HSM  Musculoskeletal: Normal range of motion. Exhibits no edema.  Lymphadenopathy:  Has no cervical adenopathy.  Neurological: Pt is alert and oriented to person, place, and time. Pt has normal reflexes. No cranial nerve deficit. Motor grossly intact Skin: Skin is warm and dry. No rash noted. LLE with 1 x .5 cm wound without red/tender/swelling or asscess, has good granulation Psychiatric:  Has normal mood and affect. Behavior is normal.     Assessment & Plan:

## 2015-02-12 DIAGNOSIS — E11628 Type 2 diabetes mellitus with other skin complications: Secondary | ICD-10-CM | POA: Diagnosis not present

## 2015-02-12 DIAGNOSIS — F418 Other specified anxiety disorders: Secondary | ICD-10-CM | POA: Diagnosis not present

## 2015-02-12 DIAGNOSIS — Z4800 Encounter for change or removal of nonsurgical wound dressing: Secondary | ICD-10-CM | POA: Diagnosis not present

## 2015-02-12 DIAGNOSIS — J449 Chronic obstructive pulmonary disease, unspecified: Secondary | ICD-10-CM | POA: Diagnosis not present

## 2015-02-12 DIAGNOSIS — L03116 Cellulitis of left lower limb: Secondary | ICD-10-CM | POA: Diagnosis not present

## 2015-02-12 DIAGNOSIS — E1149 Type 2 diabetes mellitus with other diabetic neurological complication: Secondary | ICD-10-CM | POA: Diagnosis not present

## 2015-02-12 DIAGNOSIS — G4733 Obstructive sleep apnea (adult) (pediatric): Secondary | ICD-10-CM | POA: Diagnosis not present

## 2015-02-12 DIAGNOSIS — I10 Essential (primary) hypertension: Secondary | ICD-10-CM | POA: Diagnosis not present

## 2015-02-12 DIAGNOSIS — E876 Hypokalemia: Secondary | ICD-10-CM | POA: Diagnosis not present

## 2015-02-16 DIAGNOSIS — F418 Other specified anxiety disorders: Secondary | ICD-10-CM | POA: Diagnosis not present

## 2015-02-16 DIAGNOSIS — J449 Chronic obstructive pulmonary disease, unspecified: Secondary | ICD-10-CM | POA: Diagnosis not present

## 2015-02-16 DIAGNOSIS — E1149 Type 2 diabetes mellitus with other diabetic neurological complication: Secondary | ICD-10-CM | POA: Diagnosis not present

## 2015-02-16 DIAGNOSIS — I10 Essential (primary) hypertension: Secondary | ICD-10-CM | POA: Diagnosis not present

## 2015-02-16 DIAGNOSIS — G4733 Obstructive sleep apnea (adult) (pediatric): Secondary | ICD-10-CM | POA: Diagnosis not present

## 2015-02-16 DIAGNOSIS — E11628 Type 2 diabetes mellitus with other skin complications: Secondary | ICD-10-CM | POA: Diagnosis not present

## 2015-02-16 DIAGNOSIS — Z4800 Encounter for change or removal of nonsurgical wound dressing: Secondary | ICD-10-CM | POA: Diagnosis not present

## 2015-02-16 DIAGNOSIS — E876 Hypokalemia: Secondary | ICD-10-CM | POA: Diagnosis not present

## 2015-02-16 DIAGNOSIS — L03116 Cellulitis of left lower limb: Secondary | ICD-10-CM | POA: Diagnosis not present

## 2015-02-19 DIAGNOSIS — E11628 Type 2 diabetes mellitus with other skin complications: Secondary | ICD-10-CM | POA: Diagnosis not present

## 2015-02-19 DIAGNOSIS — F418 Other specified anxiety disorders: Secondary | ICD-10-CM | POA: Diagnosis not present

## 2015-02-19 DIAGNOSIS — I10 Essential (primary) hypertension: Secondary | ICD-10-CM | POA: Diagnosis not present

## 2015-02-19 DIAGNOSIS — E1149 Type 2 diabetes mellitus with other diabetic neurological complication: Secondary | ICD-10-CM | POA: Diagnosis not present

## 2015-02-19 DIAGNOSIS — L03116 Cellulitis of left lower limb: Secondary | ICD-10-CM | POA: Diagnosis not present

## 2015-02-19 DIAGNOSIS — J449 Chronic obstructive pulmonary disease, unspecified: Secondary | ICD-10-CM | POA: Diagnosis not present

## 2015-02-19 DIAGNOSIS — G4733 Obstructive sleep apnea (adult) (pediatric): Secondary | ICD-10-CM | POA: Diagnosis not present

## 2015-02-19 DIAGNOSIS — Z4800 Encounter for change or removal of nonsurgical wound dressing: Secondary | ICD-10-CM | POA: Diagnosis not present

## 2015-02-19 DIAGNOSIS — E876 Hypokalemia: Secondary | ICD-10-CM | POA: Diagnosis not present

## 2015-02-25 ENCOUNTER — Telehealth: Payer: Self-pay | Admitting: Internal Medicine

## 2015-02-25 NOTE — Telephone Encounter (Signed)
Verbal authorization given via confidential VM

## 2015-02-25 NOTE — Telephone Encounter (Signed)
Lattie Haw from [facility] called stated patient has a wound to his left leg and need a  verbal order for skill nursing 1x week for 4 weeks phone # [phone]

## 2015-03-02 DIAGNOSIS — Z4800 Encounter for change or removal of nonsurgical wound dressing: Secondary | ICD-10-CM | POA: Diagnosis not present

## 2015-03-02 DIAGNOSIS — I10 Essential (primary) hypertension: Secondary | ICD-10-CM | POA: Diagnosis not present

## 2015-03-02 DIAGNOSIS — F418 Other specified anxiety disorders: Secondary | ICD-10-CM | POA: Diagnosis not present

## 2015-03-02 DIAGNOSIS — E11628 Type 2 diabetes mellitus with other skin complications: Secondary | ICD-10-CM | POA: Diagnosis not present

## 2015-03-02 DIAGNOSIS — E876 Hypokalemia: Secondary | ICD-10-CM | POA: Diagnosis not present

## 2015-03-02 DIAGNOSIS — E1149 Type 2 diabetes mellitus with other diabetic neurological complication: Secondary | ICD-10-CM | POA: Diagnosis not present

## 2015-03-02 DIAGNOSIS — J449 Chronic obstructive pulmonary disease, unspecified: Secondary | ICD-10-CM | POA: Diagnosis not present

## 2015-03-02 DIAGNOSIS — L03116 Cellulitis of left lower limb: Secondary | ICD-10-CM | POA: Diagnosis not present

## 2015-03-02 DIAGNOSIS — G4733 Obstructive sleep apnea (adult) (pediatric): Secondary | ICD-10-CM | POA: Diagnosis not present

## 2015-03-17 ENCOUNTER — Other Ambulatory Visit: Payer: Self-pay

## 2015-03-17 MED ORDER — SIMVASTATIN 80 MG PO TABS: 80.0000 mg | ORAL_TABLET | Freq: Every day | ORAL | Status: DC

## 2015-03-22 ENCOUNTER — Other Ambulatory Visit: Payer: Self-pay | Admitting: *Deleted

## 2015-03-22 MED ORDER — GLIPIZIDE ER 5 MG PO TB24: 5.0000 mg | ORAL_TABLET | Freq: Every day | ORAL | Status: DC

## 2015-04-22 ENCOUNTER — Other Ambulatory Visit: Payer: Self-pay | Admitting: Family Medicine

## 2015-04-22 NOTE — Telephone Encounter (Signed)
Refill done.  

## 2015-05-24 ENCOUNTER — Other Ambulatory Visit: Payer: Self-pay | Admitting: *Deleted

## 2015-05-24 MED ORDER — METFORMIN HCL 1000 MG PO TABS: 1000.0000 mg | ORAL_TABLET | Freq: Two times a day (BID) | ORAL | Status: DC

## 2015-05-24 MED ORDER — PIOGLITAZONE HCL 30 MG PO TABS: ORAL_TABLET | ORAL | Status: DC

## 2015-06-01 ENCOUNTER — Ambulatory Visit: Payer: Medicare Other | Admitting: Internal Medicine

## 2015-06-15 ENCOUNTER — Other Ambulatory Visit: Payer: Self-pay

## 2015-06-15 MED ORDER — LABETALOL HCL 300 MG PO TABS: 300.0000 mg | ORAL_TABLET | Freq: Two times a day (BID) | ORAL | Status: DC

## 2015-06-22 ENCOUNTER — Ambulatory Visit (INDEPENDENT_AMBULATORY_CARE_PROVIDER_SITE_OTHER): Payer: Medicare Other | Admitting: Internal Medicine

## 2015-06-22 ENCOUNTER — Encounter: Payer: Self-pay | Admitting: Internal Medicine

## 2015-06-22 ENCOUNTER — Other Ambulatory Visit: Payer: Medicare Other

## 2015-06-22 ENCOUNTER — Other Ambulatory Visit: Payer: Self-pay | Admitting: Internal Medicine

## 2015-06-22 ENCOUNTER — Ambulatory Visit (INDEPENDENT_AMBULATORY_CARE_PROVIDER_SITE_OTHER)
Admission: RE | Admit: 2015-06-22 | Discharge: 2015-06-22 | Disposition: A | Discharge status: Home / Self Care | Payer: Medicare Other | Source: Ambulatory Visit | Attending: Internal Medicine | Admitting: Internal Medicine

## 2015-06-22 VITALS — BP 118/66 | HR 83 | Temp 98.0°F | Ht 72.0 in | Wt 329.0 lb

## 2015-06-22 DIAGNOSIS — M545 Low back pain, unspecified: Secondary | ICD-10-CM

## 2015-06-22 DIAGNOSIS — J441 Chronic obstructive pulmonary disease with (acute) exacerbation: Secondary | ICD-10-CM | POA: Diagnosis not present

## 2015-06-22 DIAGNOSIS — M25562 Pain in left knee: Secondary | ICD-10-CM

## 2015-06-22 DIAGNOSIS — Z23 Encounter for immunization: Secondary | ICD-10-CM

## 2015-06-22 DIAGNOSIS — R0602 Shortness of breath: Secondary | ICD-10-CM

## 2015-06-22 DIAGNOSIS — R059 Cough, unspecified: Secondary | ICD-10-CM | POA: Insufficient documentation

## 2015-06-22 DIAGNOSIS — R05 Cough: Secondary | ICD-10-CM

## 2015-06-22 DIAGNOSIS — J449 Chronic obstructive pulmonary disease, unspecified: Secondary | ICD-10-CM

## 2015-06-22 DIAGNOSIS — M25561 Pain in right knee: Secondary | ICD-10-CM

## 2015-06-22 DIAGNOSIS — Z0000 Encounter for general adult medical examination without abnormal findings: Secondary | ICD-10-CM | POA: Diagnosis not present

## 2015-06-22 DIAGNOSIS — H538 Other visual disturbances: Secondary | ICD-10-CM

## 2015-06-22 LAB — POCT INFLUENZA A/B: INFLUENZA A, POC: NEGATIVE

## 2015-06-22 MED ORDER — LEVOFLOXACIN 500 MG PO TABS: 500.0000 mg | ORAL_TABLET | Freq: Every day | ORAL | Status: DC

## 2015-06-22 MED ORDER — HYDROCODONE-HOMATROPINE 5-1.5 MG/5ML PO SYRP: 5.0000 mL | ORAL_SOLUTION | Freq: Four times a day (QID) | ORAL | Status: DC | PRN

## 2015-06-22 MED ORDER — FUROSEMIDE 40 MG PO TABS: 20.0000 mg | ORAL_TABLET | Freq: Every day | ORAL | Status: DC

## 2015-06-22 MED ORDER — FENOFIBRATE 160 MG PO TABS: 160.0000 mg | ORAL_TABLET | Freq: Every day | ORAL | Status: DC

## 2015-06-22 MED ORDER — ALBUTEROL SULFATE HFA 108 (90 BASE) MCG/ACT IN AERS: 2.0000 | INHALATION_SPRAY | Freq: Four times a day (QID) | RESPIRATORY_TRACT | Status: DC | PRN

## 2015-06-22 MED ORDER — PIOGLITAZONE HCL 30 MG PO TABS: ORAL_TABLET | ORAL | Status: DC

## 2015-06-22 MED ORDER — FLUTICASONE-SALMETEROL 250-50 MCG/DOSE IN AEPB: 1.0000 | INHALATION_SPRAY | Freq: Two times a day (BID) | RESPIRATORY_TRACT | Status: DC

## 2015-06-22 MED ORDER — GLIPIZIDE ER 5 MG PO TB24: 5.0000 mg | ORAL_TABLET | Freq: Every day | ORAL | Status: DC

## 2015-06-22 MED ORDER — METFORMIN HCL 1000 MG PO TABS: 1000.0000 mg | ORAL_TABLET | Freq: Two times a day (BID) | ORAL | Status: DC

## 2015-06-22 MED ORDER — PREDNISONE 10 MG PO TABS: ORAL_TABLET | ORAL | Status: DC

## 2015-06-22 MED ORDER — METHYLPREDNISOLONE ACETATE 80 MG/ML IJ SUSP
80.0000 mg | Freq: Once | INTRAMUSCULAR | Status: AC
  Administered 2015-06-22: 80 mg via INTRAMUSCULAR

## 2015-06-22 NOTE — Progress Notes (Signed)
Subjective:    Patient ID: Eric [name], male    DOB: [birth year], 64 y.o.   MRN: [id number]  HPI  Here for wellness and f/u;  Overall doing ok;  Pt denies orthopnea, PND, worsening LE edema, palpitations, dizziness or syncope.  Pt denies neurological change such as new headache, facial or extremity weakness.  Pt denies polydipsia, polyuria, or low sugar symptoms. Pt states overall good compliance with treatment and medications, good tolerability, and has been trying to follow appropriate diet.  Pt denies worsening depressive symptoms, suicidal ideation or panic. No fever, night sweats, wt loss, loss of appetite, or other constitutional symptoms.  Pt states good ability with ADL's, has overall low to mod fall risk, home safety reviewed and adequate, no other significant changes in hearing or vision, and only occasionally active with exercise.  Did have a mistep stepping up on curb at [employer] last weekend, fell backwards, hit lower back with pain on drive home, but better now.  Has also had worseinng balance issues recently, can only walk 10 yds before back pain, sob and dizzness, worse gradually in the past yr. Had to quit going to the gym due to symptoms.  Couldn't swim at beach in the pool due to the lower back pain, site of prior surgury.  Has seen Dr Tamala Julian for knees but not seen recently, and not for the lower back.  Also with worseing left eye blurriness, right eye ok, has not seen optho. Incidentally also with: Here with acute onset mild to mod 2-3 days ST, HA, general weakness and malaise, with prod cough greenish sputum.  Still smoking but cutting back, now at 1/2 ppd but prior 2 ppd for 30 yrs. Not using adviar recently. Past Medical History  Diagnosis Date  . ALLERGIC RHINITIS 06/24/2007  . 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 ANXIETY 06/24/2007  . CELLULITIS AND ABSCESS OF LEG EXCEPT FOOT 03/28/2010  . COLONIC POLYPS, HX OF 06/24/2007  . CONSTIPATION, CHRONIC, HX OF 06/24/2007  . DEPRESSION 06/24/2007    . DIVERTICULOSIS, COLON 06/24/2007  . HYPERLIPIDEMIA 06/24/2007  . HYPERTENSION 06/24/2007  . INSOMNIA-SLEEP DISORDER-UNSPEC 01/24/2010  . SKIN LESION 01/24/2010  . Unspecified Peripheral Vascular Disease 06/24/2007  . Claudication ([place]) 06/02/2011  . Coronary artery disease   . COPD (chronic obstructive pulmonary disease) ([place])   . DVT (deep venous thrombosis) ([place])   . Complication of anesthesia     "they can't put me asleep cause I don't wake up" (01/27/2015)  . OSA (obstructive sleep apnea)   . Heart murmur   . CHF (congestive heart failure) ([place])   . Myocardial infarction ([place]) 1990's X 1    "w/stroke"  . CVA (cerebral vascular accident) [facility]) 1990's    "w/MI"; denies residual on 01/27/2015  . DIABETES MELLITUS, TYPE II dx'd 2015  . Migraine     "comes over my right eye q now and then" (01/27/2015)  . Arthritis     "hands, knees, hips, neck; q part of the body" (01/27/2015)  . Chronic lower back pain     "I have a steel rod in my back"  . Cellulitis of left leg 01/27/2015   Past Surgical History  Procedure Laterality Date  . Anterior cervical decomp/discectomy fusion  1985    "job related injury"  . Knee arthroscopy Bilateral 1990  . Ligament repair Right 1999    "wrist; job related injury"  . Lumbar laminectomy  1990's  . Back surgery      reports that  he has been smoking Cigars.  He has never used smokeless tobacco. He reports that he drinks alcohol. He reports that he does not use illicit drugs. family history includes COPD in his father; Cancer in his mother; Heart attack in his brother; Heart disease in his brother, father, maternal grandfather, and maternal grandmother; Hypertension in his brother; Peripheral vascular disease in his brother. Allergies  Allergen Reactions  . Oxycodone Other (See Comments)    Sedation/somnolence   Current Outpatient Prescriptions on File Prior to Visit  Medication Sig Dispense Refill  . aspirin 81 MG tablet Take 81 mg by mouth daily.      .  Bisacodyl (LAXATIVE PO) Take 1 capsule by mouth daily.    . Blood Glucose Monitoring Suppl DEVI 1 Device by Does not apply route daily. 1 each 0  . clonazePAM (KLONOPIN) 0.5 MG tablet Take 1-2 tablets (0.5-1 mg total) by mouth at bedtime. Pt can take up to 2 depending on anxiety level 60 tablet 5  . Fluticasone-Salmeterol (ADVAIR DISKUS) 250-50 MCG/DOSE AEPB Inhale 1 puff into the lungs 2 (two) times daily. 1 each 11  . furosemide (LASIX) 20 MG tablet Take 0.5 tablets (10 mg total) by mouth daily. 30 tablet 1  . gabapentin (NEURONTIN) 100 MG capsule Take 2 capsules (200 mg total) by mouth at bedtime. 60 capsule 0  . glucose blood test strip Use as instructed 100 each 12  . hydrochlorothiazide (HYDRODIURIL) 25 MG tablet Take 1 tablet (25 mg total) by mouth daily. 30 tablet 11  . HYDROcodone-acetaminophen (NORCO/VICODIN) 5-325 MG per tablet Take 1 tablet by mouth every 4 (four) hours. 30 tablet 0  . labetalol (NORMODYNE) 300 MG tablet Take 1 tablet (300 mg total) by mouth 2 (two) times daily. 60 tablet 5  . Lancets (ACCU-CHEK SOFT TOUCH) lancets Use as instructed 100 each 12  . simvastatin (ZOCOR) 80 MG tablet Take 1 tablet (80 mg total) by mouth at bedtime. 90 tablet 3   No current facility-administered medications on file prior to visit.    Review of Systems Constitutional: Negative for increased diaphoresis, other activity, appetite or siginficant weight change other than noted HENT: Negative for worsening hearing loss, ear pain, facial swelling, mouth sores and neck stiffness.   Eyes: Negative for other worsening pain, redness or visual disturbance.  Respiratory: Negative for shortness of breath and wheezing  Cardiovascular: Negative for chest pain and palpitations.  Gastrointestinal: Negative for diarrhea, blood in stool, abdominal distention or other pain Genitourinary: Negative for hematuria, flank pain or change in urine volume.  Musculoskeletal: Negative for myalgias or other joint  complaints.  Skin: Negative for color change and wound or drainage.  Neurological: Negative for syncope and numbness. other than noted Hematological: Negative for adenopathy. or other swelling Psychiatric/Behavioral: Negative for hallucinations, SI, self-injury, decreased concentration or other worsening agitation.      Objective:   Physical Exam BP 118/66 mmHg  Pulse 83  Temp(Src) 98 F (36.7 C) (Oral)  Ht 6' (1.829 m)  Wt 329 lb (149.233 kg)  BMI 44.61 kg/m2  SpO2 95% VS noted, mild ill Constitutional: Pt is oriented to person, place, and time. Appears well-developed and well-nourished, in no significant distress Head: Normocephalic and atraumatic.  Right Ear: External ear normal.  Left Ear: External ear normal.  Nose: Nose normal.  Mouth/Throat: Oropharynx is clear and moist.  Bilat tm's with mild erythema.  Max sinus areas non tender.  Pharynx with mild erythema, no exudate Eyes: Conjunctivae and EOM are normal.  Pupils are equal, round, and reactive to light.  Neck: Normal range of motion. Neck supple. No JVD present. No tracheal deviation present or significant neck LA or mass Cardiovascular: Normal rate, regular rhythm, normal heart sounds and intact distal pulses.   Pulmonary/Chest: Effort normal and breath sounds decreased with few bilat hweeze without rales Abdominal: Soft. Bowel sounds are normal. NT. No HSM  Musculoskeletal: Normal range of motion. Exhibits chronic trace bilat edema right > left.  Lymphadenopathy:  Has no cervical adenopathy.  Neurological: Pt is alert and oriented to person, place, and time. Pt has normal reflexes. No cranial nerve deficit. Motor grossly intact except for left eye reduced vision Spine with midline tender mid lumbar, no swelling or rash bilat knees with warm, trace effusion Skin: Skin is warm and dry. No rash noted.  Psychiatric:  Has normal mood and affect. Behavior is normal.   Influenza rapid - neg for A and B    Assessment &  Plan:

## 2015-06-22 NOTE — Patient Instructions (Addendum)
Your rapid flu test was neg today  You had the flu shot today, and the steroid shot today  Please take all new medication as prescribed - the antibiotic, cough medicine, prednisone, and albuterol inhaler  Please continue all other medications as before, including the Advair  Please have the pharmacy call with any other refills you may need.  Please continue your efforts at being more active, low cholesterol diet, and weight control.  You are otherwise up to date with prevention measures today.  You will be contacted regarding the referral for: opthomology, pulmonary, and orthopedic, as well as the echocardiogram  Please keep your appointments with your specialists as you may have planned  Please go to the XRAY Department in the Basement (go straight as you get off the elevator) for the x-ray testing  Please go to the LAB in the Basement (turn left off the elevator) for the tests to be done today  You will be contacted by phone if any changes need to be made immediately.  Otherwise, you will receive a letter about your results with an explanation, but please check with MyChart first.  Please remember to sign up for MyChart if you have not done so, as this will be important to you in the future with finding out test results, communicating by private email, and scheduling acute appointments online when needed.  Please return in 6 months, or sooner if needed, with Lab testing done 3-5 days before

## 2015-06-22 NOTE — Progress Notes (Signed)
Pre visit review using our clinic review tool, if applicable. No additional management support is needed unless otherwise documented below in the visit note. 

## 2015-06-23 ENCOUNTER — Telehealth: Payer: Self-pay | Admitting: Internal Medicine

## 2015-06-23 MED ORDER — GABAPENTIN 100 MG PO CAPS: ORAL_CAPSULE | ORAL | Status: DC

## 2015-06-23 MED ORDER — CLONAZEPAM 0.5 MG PO TABS: 0.5000 mg | ORAL_TABLET | Freq: Every day | ORAL | Status: DC

## 2015-06-23 NOTE — Telephone Encounter (Signed)
klnopin Done hardcopy to [facility]  Gabapentin done erx

## 2015-06-23 NOTE — Telephone Encounter (Signed)
Please advise on Klonopin refill?   Gabapentin managed by Dr. Tamala Julian

## 2015-06-23 NOTE — Telephone Encounter (Signed)
Rx faxed to pharmacy  

## 2015-06-23 NOTE — Telephone Encounter (Signed)
Patient was just in yesterday and he is requesting refills for clonazePAM (KLONOPIN) 0.5 MG tablet [[id number] and gabapentin (NEURONTIN) 100 MG capsule   Pharmacy is [facility]

## 2015-06-26 NOTE — Assessment & Plan Note (Signed)
stable overall by history and exam, recent data reviewed with pt, and pt to continue medical treatment as before,  to f/u any worsening symptoms or concerns SpO2 Readings from Last 3 Encounters:  06/22/15 95%  02/10/15 94%  02/03/15 96%

## 2015-06-26 NOTE — Assessment & Plan Note (Signed)
Mild to mod, for depomedrol IM, predpac asd, albut MDI,  to f/u any worsening symptoms or concerns

## 2015-06-26 NOTE — Assessment & Plan Note (Addendum)
Mild to mod, for pain control,  to f/u any worsening symptoms or concerns  Note:  Total time for pt hx, exam, review of record with pt in the room, determination of diagnoses and plan for further eval and tx is > 40 min, with over 50% spent in coordination and counseling of patient

## 2015-06-26 NOTE — Assessment & Plan Note (Signed)
Also for optho referral per pt reqeust

## 2015-06-26 NOTE — Assessment & Plan Note (Signed)
Mild to mod, for antibx course,  to f/u any worsening symptoms or concerns 

## 2015-07-02 ENCOUNTER — Encounter: Payer: Self-pay | Admitting: Internal Medicine

## 2015-07-02 DIAGNOSIS — H2513 Age-related nuclear cataract, bilateral: Secondary | ICD-10-CM | POA: Diagnosis not present

## 2015-07-02 DIAGNOSIS — E119 Type 2 diabetes mellitus without complications: Secondary | ICD-10-CM | POA: Diagnosis not present

## 2015-07-02 LAB — HM DIABETES EYE EXAM

## 2015-08-27 ENCOUNTER — Other Ambulatory Visit (HOSPITAL_COMMUNITY): Payer: Medicare Other

## 2015-08-27 ENCOUNTER — Encounter (HOSPITAL_COMMUNITY): Payer: Medicare Other

## 2015-08-27 ENCOUNTER — Ambulatory Visit: Payer: Medicare Other | Admitting: Vascular Surgery

## 2015-09-15 DIAGNOSIS — H25812 Combined forms of age-related cataract, left eye: Secondary | ICD-10-CM | POA: Diagnosis not present

## 2015-09-15 DIAGNOSIS — H2512 Age-related nuclear cataract, left eye: Secondary | ICD-10-CM | POA: Diagnosis not present

## 2015-10-21 ENCOUNTER — Other Ambulatory Visit: Payer: Self-pay

## 2015-10-21 DIAGNOSIS — R609 Edema, unspecified: Secondary | ICD-10-CM

## 2015-10-21 MED ORDER — HYDROCHLOROTHIAZIDE 25 MG PO TABS: 25.0000 mg | ORAL_TABLET | Freq: Every day | ORAL | Status: DC

## 2015-11-12 ENCOUNTER — Telehealth: Payer: Self-pay

## 2015-11-12 MED ORDER — GABAPENTIN 100 MG PO CAPS: ORAL_CAPSULE | ORAL | Status: DC

## 2015-11-12 NOTE — Addendum Note (Signed)
Addended by: Biagio Borg on: 11/12/2015 12:26 PM   Modules accepted: Orders

## 2015-11-12 NOTE — Telephone Encounter (Signed)
Please advise, patient is requesting refill on gabapentin

## 2015-11-12 NOTE — Telephone Encounter (Signed)
rx done erx

## 2015-12-21 ENCOUNTER — Ambulatory Visit (INDEPENDENT_AMBULATORY_CARE_PROVIDER_SITE_OTHER): Payer: Medicare Other | Admitting: Internal Medicine

## 2015-12-21 ENCOUNTER — Other Ambulatory Visit: Payer: Self-pay

## 2015-12-21 ENCOUNTER — Encounter: Payer: Self-pay | Admitting: Internal Medicine

## 2015-12-21 ENCOUNTER — Other Ambulatory Visit (INDEPENDENT_AMBULATORY_CARE_PROVIDER_SITE_OTHER): Payer: Medicare Other

## 2015-12-21 ENCOUNTER — Telehealth: Payer: Self-pay

## 2015-12-21 VITALS — BP 144/60 | HR 79 | Temp 98.3°F | Resp 20 | Wt 325.0 lb

## 2015-12-21 DIAGNOSIS — R6889 Other general symptoms and signs: Secondary | ICD-10-CM

## 2015-12-21 DIAGNOSIS — M545 Low back pain, unspecified: Secondary | ICD-10-CM

## 2015-12-21 DIAGNOSIS — E1140 Type 2 diabetes mellitus with diabetic neuropathy, unspecified: Secondary | ICD-10-CM

## 2015-12-21 DIAGNOSIS — I10 Essential (primary) hypertension: Secondary | ICD-10-CM

## 2015-12-21 DIAGNOSIS — R7989 Other specified abnormal findings of blood chemistry: Secondary | ICD-10-CM

## 2015-12-21 DIAGNOSIS — Z1159 Encounter for screening for other viral diseases: Secondary | ICD-10-CM

## 2015-12-21 DIAGNOSIS — Z0001 Encounter for general adult medical examination with abnormal findings: Secondary | ICD-10-CM | POA: Diagnosis not present

## 2015-12-21 DIAGNOSIS — E785 Hyperlipidemia, unspecified: Secondary | ICD-10-CM

## 2015-12-21 DIAGNOSIS — J449 Chronic obstructive pulmonary disease, unspecified: Secondary | ICD-10-CM

## 2015-12-21 HISTORY — DX: Morbid (severe) obesity due to excess calories: E66.01

## 2015-12-21 LAB — CBC WITH DIFFERENTIAL/PLATELET
Basophils Absolute: 0 10*3/uL (ref 0.0–0.1)
Basophils Relative: 0.6 % (ref 0.0–3.0)
Eosinophils Absolute: 0.2 10*3/uL (ref 0.0–0.7)
Eosinophils Relative: 3 % (ref 0.0–5.0)
HCT: 45.9 % (ref 39.0–52.0)
Hemoglobin: 15.8 g/dL (ref 13.0–17.0)
LYMPHS ABS: 1.8 10*3/uL (ref 0.7–4.0)
Lymphocytes Relative: 29.4 % (ref 12.0–46.0)
MCHC: 34.4 g/dL (ref 30.0–36.0)
MCV: 100.6 fl — ABNORMAL HIGH (ref 78.0–100.0)
MONO ABS: 0.4 10*3/uL (ref 0.1–1.0)
Monocytes Relative: 7 % (ref 3.0–12.0)
NEUTROS PCT: 60 % (ref 43.0–77.0)
Neutro Abs: 3.7 10*3/uL (ref 1.4–7.7)
Platelets: 91 10*3/uL — ABNORMAL LOW (ref 150.0–400.0)
RBC: 4.56 Mil/uL (ref 4.22–5.81)
RDW: 14.1 % (ref 11.5–15.5)
WBC: 6.1 10*3/uL (ref 4.0–10.5)

## 2015-12-21 LAB — URINALYSIS, ROUTINE W REFLEX MICROSCOPIC
Bilirubin Urine: NEGATIVE
Hgb urine dipstick: NEGATIVE
Ketones, ur: NEGATIVE
LEUKOCYTES UA: NEGATIVE
Nitrite: NEGATIVE
RBC / HPF: NONE SEEN (ref 0–?)
TOTAL PROTEIN, URINE-UPE24: NEGATIVE
URINE GLUCOSE: NEGATIVE
Urobilinogen, UA: 0.2 (ref 0.0–1.0)
WBC, UA: NONE SEEN (ref 0–?)
pH: 6.5 (ref 5.0–8.0)

## 2015-12-21 LAB — LIPID PANEL
CHOL/HDL RATIO: 4
Cholesterol: 138 mg/dL (ref 0–200)
HDL: 33 mg/dL — AB (ref >39.00)
NONHDL: 104.57
TRIGLYCERIDES: 211 mg/dL — AB (ref 0.0–149.0)
VLDL: 42.2 mg/dL — ABNORMAL HIGH (ref 0.0–40.0)

## 2015-12-21 LAB — BASIC METABOLIC PANEL
BUN: 21 mg/dL (ref 6–23)
CHLORIDE: 97 meq/L (ref 96–112)
CO2: 31 meq/L (ref 19–32)
Calcium: 10.5 mg/dL (ref 8.4–10.5)
Creatinine, Ser: 1.14 mg/dL (ref 0.40–1.50)
GFR: 68.35 mL/min (ref >60.00)
GLUCOSE: 120 mg/dL — AB (ref 70–99)
POTASSIUM: 4.3 meq/L (ref 3.5–5.1)
Sodium: 138 mEq/L (ref 135–145)

## 2015-12-21 LAB — HEPATIC FUNCTION PANEL
ALBUMIN: 4.7 g/dL (ref 3.5–5.2)
ALK PHOS: 34 U/L — AB (ref 39–117)
ALT: 17 U/L (ref 0–53)
AST: 20 U/L (ref 0–37)
Bilirubin, Direct: 0.2 mg/dL (ref 0.0–0.3)
Total Bilirubin: 0.6 mg/dL (ref 0.2–1.2)
Total Protein: 6.9 g/dL (ref 6.0–8.3)

## 2015-12-21 LAB — LDL CHOLESTEROL, DIRECT: Direct LDL: 79 mg/dL

## 2015-12-21 LAB — HEMOGLOBIN A1C: Hgb A1c MFr Bld: 6.1 % (ref 4.6–6.5)

## 2015-12-21 LAB — MICROALBUMIN / CREATININE URINE RATIO
Creatinine,U: 17.2 mg/dL
MICROALB/CREAT RATIO: 4.1 mg/g (ref 0.0–30.0)
Microalb, Ur: <0.7 mg/dL (ref 0.0–1.9)

## 2015-12-21 LAB — PSA: PSA: 0.15 ng/mL (ref 0.10–4.00)

## 2015-12-21 LAB — TSH: TSH: 2.23 u[IU]/mL (ref 0.35–4.50)

## 2015-12-21 MED ORDER — BUDESONIDE-FORMOTEROL FUMARATE 160-4.5 MCG/ACT IN AERO: 2.0000 | INHALATION_SPRAY | Freq: Two times a day (BID) | RESPIRATORY_TRACT | Status: DC

## 2015-12-21 MED ORDER — CLONAZEPAM 0.5 MG PO TABS: 0.5000 mg | ORAL_TABLET | Freq: Every day | ORAL | Status: DC

## 2015-12-21 MED ORDER — LABETALOL HCL 300 MG PO TABS: 300.0000 mg | ORAL_TABLET | Freq: Two times a day (BID) | ORAL | Status: DC

## 2015-12-21 NOTE — Telephone Encounter (Signed)
Please advise patient is requesting refill on clonazepam

## 2015-12-21 NOTE — Addendum Note (Signed)
Addended by: Biagio Borg on: 12/21/2015 07:28 PM   Modules accepted: Orders

## 2015-12-21 NOTE — Assessment & Plan Note (Signed)
stable overall by history and exam, recent data reviewed with pt, and pt to continue medical treatment as before excep to change the adviar to symbicort asd,  to f/u any worsening symptoms or concerns SpO2 Readings from Last 3 Encounters:  12/21/15 93%  06/22/15 95%  02/10/15 94%   May have some element of obesity hypovent syndrome, refuses pulm referral,

## 2015-12-21 NOTE — Assessment & Plan Note (Signed)
Pt inquires about rx for wheelchair, but I asked him to see ortho and PT first before resorting to this, but he declines

## 2015-12-21 NOTE — Assessment & Plan Note (Signed)
Encourage less calories, more active as possible, reduce wt

## 2015-12-21 NOTE — Assessment & Plan Note (Signed)
Overall doing well, age appropriate education and counseling updated, referrals for preventative services and immunizations addressed, dietary and smoking counseling addressed, most recent labs reviewed.  I have personally reviewed and have noted:  1) the patient's medical and social history 2) The pt's use of alcohol, tobacco, and illicit drugs 3) The patient's current medications and supplements 4) Functional ability including ADL's, fall risk, home safety risk, hearing and visual impairment 5) Diet and physical activities 6) Evidence for depression or mood disorder 7) The patient's height, weight, and BMI have been recorded in the chart  I have made referrals, and provided counseling and education based on review of the above Lab Results  Component Value Date   WBC 4.2 02/02/2015   HGB 11.9* 02/02/2015   HCT 36.8* 02/02/2015   PLT 127* 02/02/2015   GLUCOSE 87 02/01/2015   CHOL 142 05/26/2014   TRIG 218.0* 05/26/2014   HDL 28.10* 05/26/2014   LDLDIRECT 82.5 05/26/2014   LDLCALC 56 11/27/2013   ALT 20 02/01/2015   AST 27 02/01/2015   NA 141 02/01/2015   K 4.2 02/01/2015   CL 107 02/01/2015   CREATININE 1.24 02/01/2015   BUN 13 02/01/2015   CO2 25 02/01/2015   TSH 2.50 05/26/2014   PSA 0.24 05/26/2014   INR 1.07 02/01/2015   HGBA1C 6.2* 01/27/2015   MICROALBUR 2.1* 05/26/2014   For f/u lab today

## 2015-12-21 NOTE — Progress Notes (Signed)
Subjective:    Patient ID: Eric [name], male    DOB: 03/05/1951, 65 y.o.   MRN: [id number]  HPI  Here for wellness and f/u;  Overall doing ok;  Pt denies Chest pain, worsening SOB, DOE, wheezing, orthopnea, PND, worsening LE edema, palpitations, dizziness or syncope, though weight makes some sob with exertion, as well as having a "head cold" for several months, breathes through his mouth. Has significant doe/back pain/and occas balance issue where he has fallen over 6 times in the past year. Not using the advair since doesn't seem to help.  S/p lumbar surgury approx 30 yrs ago.  Has had knee cortisone shots that helped, but still having the back issues.  Has not yet had xray, MRI, ortho eval.  .Does have several wks ongoing nasal allergy symptoms with clearish congestion, itch and sneezing, without fever, pain, ST, cough, swelling or wheezing.  Pt denies neurological change such as new headache, facial or extremity weakness.  Pt denies polydipsia, polyuria, or low sugar symptoms. Pt states overall good compliance with treatment and medications, good tolerability, and has been trying to follow appropriate diet.  Pt denies worsening depressive symptoms, suicidal ideation or panic. No fever, night sweats, wt loss, loss of appetite, or other constitutional symptoms.  Pt states good ability with ADL's, has low fall risk, home safety reviewed and adequate, no other significant changes in hearing or vision, and only occasionally active with exercise.  Has had more stress recently, wife had recent MI.   Recalls he was 250 about 1999, with steady gain since then, and at lesat 25 lbs of that since oct 2014.   Wt Readings from Last 3 Encounters:  12/21/15 325 lb (147.419 kg)  06/22/15 329 lb (149.233 kg)  02/10/15 321 lb (145.605 kg)  Declines colonoscopy due to difficulty with sedation.  Allergic to plastic of CPAP so not using, has signficant daytime somnolence.  Past Medical History  Diagnosis Date  .  ALLERGIC RHINITIS 06/24/2007  . 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 ANXIETY 06/24/2007  . CELLULITIS AND ABSCESS OF LEG EXCEPT FOOT 03/28/2010  . COLONIC POLYPS, HX OF 06/24/2007  . CONSTIPATION, CHRONIC, HX OF 06/24/2007  . DEPRESSION 06/24/2007  . DIVERTICULOSIS, COLON 06/24/2007  . HYPERLIPIDEMIA 06/24/2007  . HYPERTENSION 06/24/2007  . INSOMNIA-SLEEP DISORDER-UNSPEC 01/24/2010  . SKIN LESION 01/24/2010  . Unspecified Peripheral Vascular Disease 06/24/2007  . Claudication ([place]) 06/02/2011  . Coronary artery disease   . COPD (chronic obstructive pulmonary disease) ([place])   . DVT (deep venous thrombosis) ([place])   . Complication of anesthesia     "they can't put me asleep cause I don't wake up" (01/27/2015)  . OSA (obstructive sleep apnea)   . Heart murmur   . CHF (congestive heart failure) ([place])   . Myocardial infarction ([place]) 1990's X 1    "w/stroke"  . CVA (cerebral vascular accident) [facility]) 1990's    "w/MI"; denies residual on 01/27/2015  . DIABETES MELLITUS, TYPE II dx'd 2015  . Migraine     "comes over my right eye q now and then" (01/27/2015)  . Arthritis     "hands, knees, hips, neck; q part of the body" (01/27/2015)  . Chronic lower back pain     "I have a steel rod in my back"  . Cellulitis of left leg 01/27/2015   Past Surgical History  Procedure Laterality Date  . Anterior cervical decomp/discectomy fusion  1985    "job related injury"  . Knee arthroscopy Bilateral 1990  .  Ligament repair Right 1999    "wrist; job related injury"  . Lumbar laminectomy  1990's  . Back surgery      reports that he has been smoking Cigars.  He has never used smokeless tobacco. He reports that he drinks alcohol. He reports that he does not use illicit drugs. family history includes COPD in his father; Cancer in his mother; Heart attack in his brother; Heart disease in his brother, father, maternal grandfather, and maternal grandmother; Hypertension in his brother; Peripheral vascular disease in his  brother. Allergies  Allergen Reactions  . Oxycodone Other (See Comments)    Sedation/somnolence   Current Outpatient Prescriptions on File Prior to Visit  Medication Sig Dispense Refill  . albuterol (PROVENTIL HFA;VENTOLIN HFA) 108 (90 BASE) MCG/ACT inhaler Inhale 2 puffs into the lungs every 6 (six) hours as needed for wheezing or shortness of breath. 1 Inhaler 11  . aspirin 81 MG tablet Take 81 mg by mouth daily.      . Bisacodyl (LAXATIVE PO) Take 1 capsule by mouth daily.    . Blood Glucose Monitoring Suppl DEVI 1 Device by Does not apply route daily. 1 each 0  . clonazePAM (KLONOPIN) 0.5 MG tablet Take 1-2 tablets (0.5-1 mg total) by mouth at bedtime. Pt can take up to 2 depending on anxiety level 60 tablet 5  . fenofibrate 160 MG tablet Take 1 tablet (160 mg total) by mouth daily. 90 tablet 3  . Fluticasone-Salmeterol (ADVAIR DISKUS) 250-50 MCG/DOSE AEPB Inhale 1 puff into the lungs 2 (two) times daily. 1 each 11  . furosemide (LASIX) 40 MG tablet Take 0.5 tablets (20 mg total) by mouth daily. 30 tablet 5  . gabapentin (NEURONTIN) 100 MG capsule Take 2 capsules (200 mg total) by mouth at bedtime. 180 capsule 1  . glipiZIDE (GLUCOTROL XL) 5 MG 24 hr tablet Take 1 tablet (5 mg total) by mouth daily with breakfast. 90 tablet 3  . glucose blood test strip Use as instructed 100 each 12  . hydrochlorothiazide (HYDRODIURIL) 25 MG tablet Take 1 tablet (25 mg total) by mouth daily. 30 tablet 4  . HYDROcodone-acetaminophen (NORCO/VICODIN) 5-325 MG per tablet Take 1 tablet by mouth every 4 (four) hours. 30 tablet 0  . labetalol (NORMODYNE) 300 MG tablet Take 1 tablet (300 mg total) by mouth 2 (two) times daily. 60 tablet 5  . Lancets (ACCU-CHEK SOFT TOUCH) lancets Use as instructed 100 each 12  . levofloxacin (LEVAQUIN) 500 MG tablet Take 1 tablet (500 mg total) by mouth daily. 10 tablet 0  . metFORMIN (GLUCOPHAGE) 1000 MG tablet Take 1 tablet (1,000 mg total) by mouth 2 (two) times daily with a  meal. 180 tablet 3  . pioglitazone (ACTOS) 30 MG tablet Take 1 tablet by mouth daily 90 tablet 3  . predniSONE (DELTASONE) 10 MG tablet 3 tabs by mouth per day for 3 days,2tabs per day for 3 days,1tab per day for 3 days 18 tablet 0  . simvastatin (ZOCOR) 80 MG tablet Take 1 tablet (80 mg total) by mouth at bedtime. 90 tablet 3   No current facility-administered medications on file prior to visit.   Review of Systems Constitutional: Negative for increased diaphoresis, or other activity, appetite or siginficant weight change other than noted HENT: Negative for worsening hearing loss, ear pain, facial swelling, mouth sores and neck stiffness.   Eyes: Negative for other worsening pain, redness or visual disturbance.  Respiratory: Negative for choking or stridor Cardiovascular: Negative for other chest  pain and palpitations.  Gastrointestinal: Negative for worsening diarrhea, blood in stool, or abdominal distention Genitourinary: Negative for hematuria, flank pain or change in urine volume.  Musculoskeletal: Negative for myalgias or other joint complaints.  Skin: Negative for other color change and wound or drainage.  Neurological: Negative for syncope and numbness. other than noted Hematological: Negative for adenopathy. or other swelling Psychiatric/Behavioral: Negative for hallucinations, SI, self-injury, decreased concentration or other worsening agitation.      Objective:   Physical Exam BP 144/60 mmHg  Pulse 79  Temp(Src) 98.3 F (36.8 C) (Oral)  Resp 20  Wt 325 lb (147.419 kg)  SpO2 93% VS noted, morbid obese Constitutional: Pt is oriented to person, place, and time. Appears well-developed and well-nourished, in no significant distress Head: Normocephalic and atraumatic  Eyes: Conjunctivae and EOM are normal. Pupils are equal, round, and reactive to light Right Ear: External ear normal.  Left Ear: External ear normal Nose: Nose normal.  Mouth/Throat: Oropharynx is clear and  moist  Bilat tm's with mild erythema.  Max sinus areas non tender.  Pharynx with mild erythema, no exudate Neck: Normal range of motion. Neck supple. No JVD present. No tracheal deviation present or significant neck LA or mass Cardiovascular: Normal rate, regular rhythm, normal heart sounds and intact distal pulses.   Pulmonary/Chest: Effort normal and breath sounds without rales or wheezing  Abdominal: Soft. Bowel sounds are normal. NT. No HSM  Musculoskeletal: Normal range of motion. Exhibits no edema Lymphadenopathy: Has no cervical adenopathy.  Neurological: Pt is alert and oriented to person, place, and time. Pt has normal reflexes. No cranial nerve deficit. Motor grossly intact Skin: Skin is warm and dry. No rash noted or new ulcers Psychiatric:  Has normal mood and affect. Behavior is normal.  Spine: nontender     Assessment & Plan:

## 2015-12-21 NOTE — Assessment & Plan Note (Signed)
stable overall by history and exam, recent data reviewed with pt, and pt to continue medical treatment as before,  to f/u any worsening symptoms or concerns Lab Results  Component Value Date   LDLCALC 56 11/27/2013   For f/u lab, cont diet

## 2015-12-21 NOTE — Telephone Encounter (Signed)
Done hardcopy to Corinne  

## 2015-12-21 NOTE — Progress Notes (Signed)
Pre visit review using our clinic review tool, if applicable. No additional management support is needed unless otherwise documented below in the visit note. 

## 2015-12-21 NOTE — Assessment & Plan Note (Signed)
stable overall by history and exam, recent data reviewed with pt, and pt to continue medical treatment as before,  to f/u any worsening symptoms or concerns Lab Results  Component Value Date   HGBA1C 6.2* 01/27/2015   For f/u lab

## 2015-12-21 NOTE — Assessment & Plan Note (Signed)
stable overall by history and exam, recent data reviewed with pt, and pt to continue medical treatment as before,  to f/u any worsening symptoms or concerns BP Readings from Last 3 Encounters:  12/21/15 144/60  06/22/15 118/66  02/10/15 118/60

## 2015-12-21 NOTE — Patient Instructions (Signed)
Please take all new medication as prescribed - the OTC allegra and nasacort for the allergies every day  OK to stop the advair  Please take all new medication as prescribed - the symbicort as directed  Please continue all other medications as before, and refills have been done if requested.  Please have the pharmacy call with any other refills you may need.  Please continue your efforts at being more active, low cholesterol diet, and weight control.  You are otherwise up to date with prevention measures today.  Please keep your appointments with your specialists as you may have planned  Please call if you change your mind about the orthopedic and Physical Therapy evaluations for your lower back  Please go to the LAB in the Basement (turn left off the elevator) for the tests to be done today  You will be contacted by phone if any changes need to be made immediately.  Otherwise, you will receive a letter about your results with an explanation, but please check with MyChart first.  Please remember to sign up for MyChart if you have not done so, as this will be important to you in the future with finding out test results, communicating by private email, and scheduling acute appointments online when needed.  Please return in 6 months, or sooner if needed, with Lab testing done 3-5 days before

## 2015-12-22 LAB — HEPATITIS C ANTIBODY: HCV AB: NEGATIVE

## 2015-12-22 NOTE — Telephone Encounter (Signed)
Medication faxed to pharmacy 

## 2015-12-28 ENCOUNTER — Telehealth: Payer: Self-pay | Admitting: *Deleted

## 2015-12-28 MED ORDER — PREDNISONE 10 MG PO TABS: ORAL_TABLET | ORAL | Status: DC

## 2015-12-28 NOTE — Telephone Encounter (Signed)
Receive call wife states they saw MD last week, and was told that he would send rx in for prednisone. Wife states pharmacy never received rx. Does pt need pls advise...Eric [name]

## 2015-12-28 NOTE — Telephone Encounter (Signed)
[place] for prednisone - done erx

## 2015-12-29 NOTE — Telephone Encounter (Signed)
Notified pt MD sent prednisone to pharmacy...Johny Chess

## 2016-03-25 ENCOUNTER — Other Ambulatory Visit: Payer: Self-pay | Admitting: Family Medicine

## 2016-03-27 NOTE — Telephone Encounter (Signed)
Refill denied. Pt has not been seen within a year.

## 2016-03-28 ENCOUNTER — Other Ambulatory Visit: Payer: Self-pay | Admitting: Emergency Medicine

## 2016-03-28 DIAGNOSIS — R609 Edema, unspecified: Secondary | ICD-10-CM

## 2016-03-28 DIAGNOSIS — R600 Localized edema: Secondary | ICD-10-CM

## 2016-03-28 MED ORDER — HYDROCHLOROTHIAZIDE 25 MG PO TABS: 25.0000 mg | ORAL_TABLET | Freq: Every day | ORAL | 5 refills | Status: DC

## 2016-04-01 ENCOUNTER — Other Ambulatory Visit: Payer: Self-pay | Admitting: Family Medicine

## 2016-04-03 NOTE — Telephone Encounter (Signed)
Refill done.  

## 2016-04-27 DIAGNOSIS — D485 Neoplasm of uncertain behavior of skin: Secondary | ICD-10-CM | POA: Diagnosis not present

## 2016-05-02 ENCOUNTER — Other Ambulatory Visit: Payer: Self-pay | Admitting: *Deleted

## 2016-05-02 MED ORDER — METFORMIN HCL 1000 MG PO TABS: 1000.0000 mg | ORAL_TABLET | Freq: Two times a day (BID) | ORAL | 1 refills | Status: DC

## 2016-05-02 MED ORDER — GLIPIZIDE ER 5 MG PO TB24: 5.0000 mg | ORAL_TABLET | Freq: Every day | ORAL | 1 refills | Status: DC

## 2016-05-16 ENCOUNTER — Telehealth: Payer: Self-pay

## 2016-05-16 MED ORDER — LABETALOL HCL 300 MG PO TABS: 300.0000 mg | ORAL_TABLET | Freq: Two times a day (BID) | ORAL | 5 refills | Status: DC

## 2016-05-16 NOTE — Telephone Encounter (Signed)
Medication refill sent to pharmacy  

## 2016-05-30 ENCOUNTER — Other Ambulatory Visit: Payer: Self-pay | Admitting: *Deleted

## 2016-05-30 MED ORDER — SIMVASTATIN 80 MG PO TABS: 80.0000 mg | ORAL_TABLET | Freq: Every day | ORAL | 2 refills | Status: DC

## 2016-05-30 NOTE — Telephone Encounter (Signed)
Wife called requesting refill on pt simvastatin. Sent med to [facility]...Johny Chess

## 2016-06-05 ENCOUNTER — Other Ambulatory Visit: Payer: Self-pay | Admitting: Ophthalmology

## 2016-06-05 DIAGNOSIS — L570 Actinic keratosis: Secondary | ICD-10-CM | POA: Diagnosis not present

## 2016-06-05 DIAGNOSIS — D2312 Other benign neoplasm of skin of left eyelid, including canthus: Secondary | ICD-10-CM | POA: Diagnosis not present

## 2016-06-12 DIAGNOSIS — C44119 Basal cell carcinoma of skin of left eyelid, including canthus: Secondary | ICD-10-CM | POA: Diagnosis not present

## 2016-06-21 ENCOUNTER — Telehealth: Payer: Self-pay | Admitting: *Deleted

## 2016-06-21 MED ORDER — CLONAZEPAM 0.5 MG PO TABS: 0.5000 mg | ORAL_TABLET | Freq: Every day | ORAL | 5 refills | Status: DC

## 2016-06-21 NOTE — Telephone Encounter (Signed)
Rec'd fax pt requesting refill on Clonazepam.../lmb

## 2016-06-21 NOTE — Telephone Encounter (Signed)
Done hardcopy to Corinne  

## 2016-06-21 NOTE — Telephone Encounter (Signed)
Faxed script back to [facility]...Johny Chess

## 2016-06-22 ENCOUNTER — Other Ambulatory Visit: Payer: Self-pay | Admitting: *Deleted

## 2016-06-22 ENCOUNTER — Ambulatory Visit (INDEPENDENT_AMBULATORY_CARE_PROVIDER_SITE_OTHER): Payer: Medicare Other | Admitting: Internal Medicine

## 2016-06-22 ENCOUNTER — Other Ambulatory Visit (INDEPENDENT_AMBULATORY_CARE_PROVIDER_SITE_OTHER): Payer: Medicare Other

## 2016-06-22 VITALS — BP 140/80 | HR 82 | Temp 98.8°F | Resp 20 | Wt 323.0 lb

## 2016-06-22 DIAGNOSIS — E1140 Type 2 diabetes mellitus with diabetic neuropathy, unspecified: Secondary | ICD-10-CM

## 2016-06-22 DIAGNOSIS — Z0001 Encounter for general adult medical examination with abnormal findings: Secondary | ICD-10-CM

## 2016-06-22 DIAGNOSIS — E785 Hyperlipidemia, unspecified: Secondary | ICD-10-CM | POA: Diagnosis not present

## 2016-06-22 DIAGNOSIS — J309 Allergic rhinitis, unspecified: Secondary | ICD-10-CM

## 2016-06-22 DIAGNOSIS — F419 Anxiety disorder, unspecified: Secondary | ICD-10-CM

## 2016-06-22 DIAGNOSIS — I10 Essential (primary) hypertension: Secondary | ICD-10-CM

## 2016-06-22 DIAGNOSIS — F418 Other specified anxiety disorders: Secondary | ICD-10-CM

## 2016-06-22 DIAGNOSIS — F329 Major depressive disorder, single episode, unspecified: Secondary | ICD-10-CM

## 2016-06-22 LAB — LIPID PANEL
CHOL/HDL RATIO: 4
Cholesterol: 122 mg/dL (ref 0–200)
HDL: 33.2 mg/dL — AB (ref >39.00)
NONHDL: 88.48
TRIGLYCERIDES: 211 mg/dL — AB (ref 0.0–149.0)
VLDL: 42.2 mg/dL — AB (ref 0.0–40.0)

## 2016-06-22 LAB — HEPATIC FUNCTION PANEL
ALT: 14 U/L (ref 0–53)
AST: 18 U/L (ref 0–37)
Albumin: 4.4 g/dL (ref 3.5–5.2)
Alkaline Phosphatase: 33 U/L — ABNORMAL LOW (ref 39–117)
BILIRUBIN DIRECT: 0.1 mg/dL (ref 0.0–0.3)
TOTAL PROTEIN: 6.7 g/dL (ref 6.0–8.3)
Total Bilirubin: 0.6 mg/dL (ref 0.2–1.2)

## 2016-06-22 LAB — BASIC METABOLIC PANEL
BUN: 15 mg/dL (ref 6–23)
CALCIUM: 9.9 mg/dL (ref 8.4–10.5)
CO2: 29 meq/L (ref 19–32)
CREATININE: 1.01 mg/dL (ref 0.40–1.50)
Chloride: 99 mEq/L (ref 96–112)
GFR: 78.47 mL/min (ref >60.00)
Glucose, Bld: 97 mg/dL (ref 70–99)
Potassium: 3.5 mEq/L (ref 3.5–5.1)
Sodium: 136 mEq/L (ref 135–145)

## 2016-06-22 LAB — HEMOGLOBIN A1C: Hgb A1c MFr Bld: 5.6 % (ref 4.6–6.5)

## 2016-06-22 LAB — LDL CHOLESTEROL, DIRECT: Direct LDL: 75 mg/dL

## 2016-06-22 MED ORDER — GABAPENTIN 100 MG PO CAPS: ORAL_CAPSULE | ORAL | 1 refills | Status: DC

## 2016-06-22 MED ORDER — GLIPIZIDE ER 2.5 MG PO TB24: 2.5000 mg | ORAL_TABLET | Freq: Every day | ORAL | 3 refills | Status: DC

## 2016-06-22 NOTE — Progress Notes (Signed)
Pre visit review using our clinic review tool, if applicable. No additional management support is needed unless otherwise documented below in the visit note. 

## 2016-06-22 NOTE — Progress Notes (Signed)
Subjective:    Patient ID: Eric [name], male    DOB: 04-22-1951, 65 y.o.   MRN: [id number]  HPI  Here to f/u; overall doing ok,  Pt denies chest pain, increasing sob or doe, wheezing, orthopnea, PND, increased LE swelling, palpitations, dizziness or syncope.  Pt denies new neurological symptoms such as new headache, or facial or extremity weakness or numbness.   Pt denies new neurological symptoms such as new headache, or facial or extremity weakness or numbness.   Pt states overall good compliance with meds, mostly trying to follow appropriate diet, with wt overall stable,  but little exercise however.   Pt denies polydipsia, polyuria, but has had several low sugar symptoms with weakness and hunger improved with po intake/bread  Pt states overall good compliance with meds, trying to follow lower cholesterol, diabetic diet, wt overall stable but little exercise however.  Did have at least one low sugar to 17 one time.  Has lost several lbs intentionally Wt Readings from Last 3 Encounters:  06/22/16 (!) 323 lb (146.5 kg)  12/21/15 (!) 325 lb (147.4 kg)  06/22/15 (!) 329 lb (149.2 kg)   S/p left facial skin cancer removed 1 mo ago, also left cataract removed, also has f/u on nov 8 for repeat left face surgury to check margins.  C/o worsening mild depression over this situation which has really bothered him. Has been thinking about, nervous. Declines counseling or med tx.  No SI or HI. Declines any specific tx  Does have several wks ongoing nasal allergy symptoms with clearish congestion, itch and sneezing, without fever, pain, ST, cough, swelling or wheezing.  Past Medical History:  Diagnosis Date  . ALLERGIC RHINITIS 06/24/2007  . 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 ANXIETY 06/24/2007  . Arthritis    "hands, knees, hips, neck; q part of the body" (01/27/2015)  . CELLULITIS AND ABSCESS OF LEG EXCEPT FOOT 03/28/2010  . Cellulitis of left leg 01/27/2015  . CHF (congestive heart failure) ([place])   . Chronic  lower back pain    "I have a steel rod in my back"  . Claudication ([place]) 06/02/2011  . COLONIC POLYPS, HX OF 06/24/2007  . Complication of anesthesia    "they can't put me asleep cause I don't wake up" (01/27/2015)  . CONSTIPATION, CHRONIC, HX OF 06/24/2007  . COPD (chronic obstructive pulmonary disease) ([place])   . Coronary artery disease   . CVA (cerebral vascular accident) [facility]) 1990's   "w/MI"; denies residual on 01/27/2015  . DEPRESSION 06/24/2007  . DIABETES MELLITUS, TYPE II dx'd 2015  . DIVERTICULOSIS, COLON 06/24/2007  . DVT (deep venous thrombosis) ([place])   . Heart murmur   . HYPERLIPIDEMIA 06/24/2007  . HYPERTENSION 06/24/2007  . INSOMNIA-SLEEP DISORDER-UNSPEC 01/24/2010  . Migraine    "comes over my right eye q now and then" (01/27/2015)  . Morbid obesity ([place]) 12/21/2015  . Myocardial infarction 1990's X 1   "w/stroke"  . OSA (obstructive sleep apnea)   . SKIN LESION 01/24/2010  . Unspecified Peripheral Vascular Disease 06/24/2007   Past Surgical History:  Procedure Laterality Date  . ANTERIOR CERVICAL DECOMP/DISCECTOMY FUSION  1985   "job related injury"  . BACK SURGERY    . KNEE ARTHROSCOPY Bilateral 1990  . LIGAMENT REPAIR Right 1999   "wrist; job related injury"  . LUMBAR LAMINECTOMY  1990's    reports that he has been smoking Cigars.  He has smoked for the past 48.00 years. He has never used smokeless tobacco.  He reports that he drinks alcohol. He reports that he does not use drugs. family history includes COPD in his father; Cancer in his mother; Heart attack in his brother; Heart disease in his brother, father, maternal grandfather, and maternal grandmother; Hypertension in his brother; Peripheral vascular disease in his brother. Allergies  Allergen Reactions  . Oxycodone Other (See Comments)    Sedation/somnolence   Current Outpatient Prescriptions on File Prior to Visit  Medication Sig Dispense Refill  . albuterol (PROVENTIL HFA;VENTOLIN HFA) 108 (90 BASE) MCG/ACT  inhaler Inhale 2 puffs into the lungs every 6 (six) hours as needed for wheezing or shortness of breath. 1 Inhaler 11  . aspirin 81 MG tablet Take 81 mg by mouth daily.      . Bisacodyl (LAXATIVE PO) Take 1 capsule by mouth daily.    . Blood Glucose Monitoring Suppl DEVI 1 Device by Does not apply route daily. 1 each 0  . budesonide-formoterol (SYMBICORT) 160-4.5 MCG/ACT inhaler Inhale 2 puffs into the lungs 2 (two) times daily. 1 Inhaler 11  . clonazePAM (KLONOPIN) 0.5 MG tablet Take 1-2 tablets (0.5-1 mg total) by mouth at bedtime. Pt can take up to 2 depending on anxiety level 60 tablet 5  . fenofibrate 160 MG tablet Take 1 tablet (160 mg total) by mouth daily. 90 tablet 3  . furosemide (LASIX) 20 MG tablet Take 0.5 tablets (10 mg total) by mouth daily. 30 tablet 5  . glucose blood test strip Use as instructed 100 each 12  . hydrochlorothiazide (HYDRODIURIL) 25 MG tablet Take 1 tablet (25 mg total) by mouth daily. 30 tablet 5  . HYDROcodone-acetaminophen (NORCO/VICODIN) 5-325 MG per tablet Take 1 tablet by mouth every 4 (four) hours. 30 tablet 0  . labetalol (NORMODYNE) 300 MG tablet Take 1 tablet (300 mg total) by mouth 2 (two) times daily. 60 tablet 5  . Lancets (ACCU-CHEK SOFT TOUCH) lancets Use as instructed 100 each 12  . metFORMIN (GLUCOPHAGE) 1000 MG tablet Take 1 tablet (1,000 mg total) by mouth 2 (two) times daily with a meal. 180 tablet 1  . pioglitazone (ACTOS) 30 MG tablet Take 1 tablet by mouth daily 90 tablet 3  . simvastatin (ZOCOR) 80 MG tablet Take 1 tablet (80 mg total) by mouth at bedtime. 90 tablet 2   No current facility-administered medications on file prior to visit.    Review of Systems  Constitutional: Negative for unusual diaphoresis or night sweats HENT: Negative for ear swelling or discharge Eyes: Negative for worsening visual haziness  Respiratory: Negative for choking and stridor.   Gastrointestinal: Negative for distension or worsening  eructation Genitourinary: Negative for retention or change in urine volume.  Musculoskeletal: Negative for other MSK pain or swelling Skin: Negative for color change and worsening wound Neurological: Negative for tremors and numbness other than noted  Psychiatric/Behavioral: Negative for decreased concentration or agitation other than above   All other system neg per pt    Objective:   Physical Exam BP 140/80   Pulse 82   Temp 98.8 F (37.1 C) (Oral)   Resp 20   Wt (!) 323 lb (146.5 kg)   SpO2 94%   BMI 43.81 kg/m  VS noted,  Constitutional: Pt appears in no apparent distress HENT: Head: NCAT.  Right Ear: External ear normal.  Left Ear: External ear normal.  Eyes: . Pupils are equal, round, and reactive to light. Conjunctivae and EOM are normal Neck: Normal range of motion. Neck supple.  Cardiovascular: Normal rate  and regular rhythm.   Pulmonary/Chest: Effort normal and breath sounds without rales or wheezing.  Abd:  Soft, NT, ND, + BS Neurological: Pt is alert. Not confused , motor grossly intact Skin: Skin is warm. No rash, no LE edema Psychiatric: Pt behavior is normal. No agitation. + mild depressed affect No other significant exam findings    Assessment & Plan:

## 2016-06-22 NOTE — Assessment & Plan Note (Signed)
Mild situational, declines need for tx such as counseling or med tx

## 2016-06-22 NOTE — Assessment & Plan Note (Signed)
Mild to mod, encouraged to restart nasacort asd,,  to f/u any worsening symptoms or concerns

## 2016-06-22 NOTE — Assessment & Plan Note (Addendum)
Overcontrolled, to decrease the glipizide ER to 2.5 qd, o/w stable overall by history and exam, recent data reviewed with pt, and pt to continue medical treatment as before,  to f/u any worsening symptoms or concerns Lab Results  Component Value Date   HGBA1C 5.6 06/22/2016

## 2016-06-22 NOTE — Assessment & Plan Note (Signed)
stable overall by history and exam, recent data reviewed with pt, and pt to continue medical treatment as before,  to f/u any worsening symptoms or concerns BP Readings from Last 3 Encounters:  06/22/16 140/80  12/21/15 (!) 144/60  06/22/15 118/66

## 2016-06-22 NOTE — Patient Instructions (Addendum)
You had the flu shot today  OK to change the glipizide ER 5 mg to the 2.5 mg per day  Please call if you change your mind about treatment for the depression  Please take your nasal spray for the allergies every day  Please continue all other medications as before, and refills have been done if requested.  Please have the pharmacy call with any other refills you may need.  Please continue your efforts at being more active, low cholesterol diabetic diet, and weight control.  Please keep your appointments with your specialists as you may have planned  Please go to the LAB in the Basement (turn left off the elevator) for the tests to be done today  You will be contacted by phone if any changes need to be made immediately.  Otherwise, you will receive a letter about your results with an explanation, but please check with MyChart first.  Please remember to sign up for MyChart if you have not done so, as this will be important to you in the future with finding out test results, communicating by private email, and scheduling acute appointments online when needed.  Please return in 6 months, or sooner if needed, with Lab testing done 3-5 days before

## 2016-06-23 ENCOUNTER — Encounter: Payer: Self-pay | Admitting: Internal Medicine

## 2016-06-28 DIAGNOSIS — C44129 Squamous cell carcinoma of skin of left eyelid, including canthus: Secondary | ICD-10-CM | POA: Diagnosis not present

## 2016-08-22 ENCOUNTER — Other Ambulatory Visit: Payer: Self-pay | Admitting: *Deleted

## 2016-08-22 MED ORDER — PIOGLITAZONE HCL 30 MG PO TABS: ORAL_TABLET | ORAL | 1 refills | Status: DC

## 2016-08-22 MED ORDER — FENOFIBRATE 160 MG PO TABS: 160.0000 mg | ORAL_TABLET | Freq: Every day | ORAL | 1 refills | Status: DC

## 2016-10-21 ENCOUNTER — Other Ambulatory Visit: Payer: Self-pay | Admitting: Internal Medicine

## 2016-10-21 DIAGNOSIS — R609 Edema, unspecified: Secondary | ICD-10-CM

## 2016-11-22 ENCOUNTER — Other Ambulatory Visit: Payer: Self-pay | Admitting: Internal Medicine

## 2016-11-22 NOTE — Telephone Encounter (Signed)
Done hardcopy to Shirron  

## 2016-11-23 NOTE — Telephone Encounter (Signed)
Faxed

## 2016-12-18 ENCOUNTER — Other Ambulatory Visit: Payer: Self-pay | Admitting: Internal Medicine

## 2016-12-18 DIAGNOSIS — R609 Edema, unspecified: Secondary | ICD-10-CM

## 2017-01-17 ENCOUNTER — Other Ambulatory Visit: Payer: Self-pay | Admitting: Internal Medicine

## 2017-01-17 DIAGNOSIS — R609 Edema, unspecified: Secondary | ICD-10-CM

## 2017-01-23 ENCOUNTER — Telehealth: Payer: Self-pay | Admitting: Internal Medicine

## 2017-01-23 NOTE — Telephone Encounter (Signed)
Called pt to schedule awv. Lvm for pt to call office to schedule appt.  °

## 2017-01-24 ENCOUNTER — Telehealth: Payer: Self-pay | Admitting: Internal Medicine

## 2017-01-24 DIAGNOSIS — R609 Edema, unspecified: Secondary | ICD-10-CM

## 2017-01-24 MED ORDER — HYDROCHLOROTHIAZIDE 25 MG PO TABS: 25.0000 mg | ORAL_TABLET | Freq: Every day | ORAL | 0 refills | Status: DC

## 2017-01-24 NOTE — Telephone Encounter (Signed)
30d supply has been sent in

## 2017-01-24 NOTE — Telephone Encounter (Signed)
Pt called in and setup a fu in July , he want to if his bp meds can be sent into the [facility] pharmacy now?   He is completely out

## 2017-01-24 NOTE — Addendum Note (Signed)
Addended by: Juliet Rude on: 01/24/2017 01:47 PM   Modules accepted: Orders

## 2017-01-25 ENCOUNTER — Encounter: Payer: Self-pay | Admitting: Internal Medicine

## 2017-01-25 ENCOUNTER — Other Ambulatory Visit (INDEPENDENT_AMBULATORY_CARE_PROVIDER_SITE_OTHER): Payer: Medicare Other

## 2017-01-25 ENCOUNTER — Ambulatory Visit (INDEPENDENT_AMBULATORY_CARE_PROVIDER_SITE_OTHER): Payer: Medicare Other | Admitting: Internal Medicine

## 2017-01-25 VITALS — BP 152/82 | HR 72 | Ht 72.0 in | Wt 323.0 lb

## 2017-01-25 DIAGNOSIS — E1140 Type 2 diabetes mellitus with diabetic neuropathy, unspecified: Secondary | ICD-10-CM | POA: Diagnosis not present

## 2017-01-25 DIAGNOSIS — Z0000 Encounter for general adult medical examination without abnormal findings: Secondary | ICD-10-CM

## 2017-01-25 LAB — HEPATIC FUNCTION PANEL
ALBUMIN: 4.6 g/dL (ref 3.5–5.2)
ALT: 14 U/L (ref 0–53)
AST: 20 U/L (ref 0–37)
Alkaline Phosphatase: 32 U/L — ABNORMAL LOW (ref 39–117)
Bilirubin, Direct: 0.2 mg/dL (ref 0.0–0.3)
Total Bilirubin: 0.6 mg/dL (ref 0.2–1.2)
Total Protein: 6.8 g/dL (ref 6.0–8.3)

## 2017-01-25 LAB — LIPID PANEL
CHOLESTEROL: 126 mg/dL (ref 0–200)
HDL: 34 mg/dL — AB (ref >39.00)
LDL Cholesterol: 63 mg/dL (ref 0–99)
NonHDL: 91.56
TRIGLYCERIDES: 141 mg/dL (ref 0.0–149.0)
Total CHOL/HDL Ratio: 4
VLDL: 28.2 mg/dL (ref 0.0–40.0)

## 2017-01-25 LAB — BASIC METABOLIC PANEL
BUN: 15 mg/dL (ref 6–23)
CALCIUM: 10.1 mg/dL (ref 8.4–10.5)
CO2: 29 mEq/L (ref 19–32)
Chloride: 101 mEq/L (ref 96–112)
Creatinine, Ser: 1.06 mg/dL (ref 0.40–1.50)
GFR: 74.08 mL/min (ref >60.00)
GLUCOSE: 91 mg/dL (ref 70–99)
Potassium: 3.8 mEq/L (ref 3.5–5.1)
Sodium: 138 mEq/L (ref 135–145)

## 2017-01-25 LAB — CBC WITH DIFFERENTIAL/PLATELET
Basophils Absolute: 0 10*3/uL (ref 0.0–0.1)
Basophils Relative: 0.7 % (ref 0.0–3.0)
EOS PCT: 2.7 % (ref 0.0–5.0)
Eosinophils Absolute: 0.1 10*3/uL (ref 0.0–0.7)
HCT: 46.7 % (ref 39.0–52.0)
HEMOGLOBIN: 15.9 g/dL (ref 13.0–17.0)
Lymphocytes Relative: 33.6 % (ref 12.0–46.0)
Lymphs Abs: 1.6 10*3/uL (ref 0.7–4.0)
MCHC: 34.1 g/dL (ref 30.0–36.0)
MCV: 103.7 fl — ABNORMAL HIGH (ref 78.0–100.0)
MONOS PCT: 7.7 % (ref 3.0–12.0)
Monocytes Absolute: 0.4 10*3/uL (ref 0.1–1.0)
Neutro Abs: 2.6 10*3/uL (ref 1.4–7.7)
Neutrophils Relative %: 55.3 % (ref 43.0–77.0)
Platelets: 80 10*3/uL — ABNORMAL LOW (ref 150.0–400.0)
RBC: 4.5 Mil/uL (ref 4.22–5.81)
RDW: 13.7 % (ref 11.5–15.5)
WBC: 4.7 10*3/uL (ref 4.0–10.5)

## 2017-01-25 LAB — URINALYSIS, ROUTINE W REFLEX MICROSCOPIC
BILIRUBIN URINE: NEGATIVE
Hgb urine dipstick: NEGATIVE
KETONES UR: NEGATIVE
LEUKOCYTES UA: NEGATIVE
NITRITE: NEGATIVE
Specific Gravity, Urine: 1.01 (ref 1.000–1.030)
TOTAL PROTEIN, URINE-UPE24: NEGATIVE
URINE GLUCOSE: NEGATIVE
UROBILINOGEN UA: 0.2 (ref 0.0–1.0)
pH: 6 (ref 5.0–8.0)

## 2017-01-25 LAB — PSA: PSA: 0.2 ng/mL (ref 0.10–4.00)

## 2017-01-25 LAB — MICROALBUMIN / CREATININE URINE RATIO
Creatinine,U: 75.8 mg/dL
Microalb Creat Ratio: 4.1 mg/g (ref 0.0–30.0)
Microalb, Ur: 3.1 mg/dL — ABNORMAL HIGH (ref 0.0–1.9)

## 2017-01-25 LAB — TSH: TSH: 3.12 u[IU]/mL (ref 0.35–4.50)

## 2017-01-25 LAB — HEMOGLOBIN A1C: Hgb A1c MFr Bld: 6.1 % (ref 4.6–6.5)

## 2017-01-25 MED ORDER — BLOOD GLUCOSE MONITOR KIT: PACK | 0 refills | Status: DC

## 2017-01-25 MED ORDER — ACCU-CHEK SOFT TOUCH LANCETS MISC: 12 refills | Status: AC

## 2017-01-25 NOTE — Patient Instructions (Signed)
Please continue all other medications as before, and refills have been done if requested.  Please have the pharmacy call with any other refills you may need.  Please continue your efforts at being more active, low cholesterol diet, and weight control.  You are otherwise up to date with prevention measures today.  Please keep your appointments with your specialists as you may have planned  Your new glucometer is given in hardcopy prescription  Please go to the LAB in the Basement (turn left off the elevator) for the tests to be done today  You will be contacted by phone if any changes need to be made immediately.  Otherwise, you will receive a letter about your results with an explanation, but please check with MyChart first.  Please remember to sign up for MyChart if you have not done so, as this will be important to you in the future with finding out test results, communicating by private email, and scheduling acute appointments online when needed.  If you have Medicare related insurance (such as traditional Medicare, [employer] or [employer], or similar), Please make an appointment at the [facility] with Sharee Pimple, the [employer], for your Wellness Visit in this office, which is a benefit with your insurance.  Please return in 6 months, or sooner if needed, with Lab testing done 3-5 days before

## 2017-01-25 NOTE — Progress Notes (Signed)
Subjective:    Patient ID: Eric [name], male    DOB: 1950-08-27, 66 y.o.   MRN: [id number]  HPI  Here for wellness and f/u;  Overall doing ok;  Pt denies Chest pain, worsening SOB, DOE, wheezing, orthopnea, PND, worsening LE edema, palpitations, dizziness or syncope.  Pt denies neurological change such as new headache, facial or extremity weakness.  Pt denies polydipsia, polyuria, or low sugar symptoms. Pt states overall good compliance with treatment and medications, good tolerability, and has been trying to follow appropriate diet.  Pt denies worsening depressive symptoms, suicidal ideation or panic. No fever, night sweats, wt loss, loss of appetite, or other constitutional symptoms.  Pt states good ability with ADL's, has low fall risk, home safety reviewed and adequate, no other significant changes in hearing or vision, not o/w active with exercise.  Dog with hind leg bone cancer and will "flop" down when tired and pt has fallen seveal times b/c of tripping on the dog, but has fallen at least twice with dizziness  Pt continues to have recurring LBP without change in severity, bowel or bladder change, fever, wt loss,  worsening LE pain/numbness/weakness, gait change or falls, and has come near to falling in the shower several times. Does not use cane in the shower, Tried shower chair but found to be impractical.  And does not think a walker would be practical in his living situation.  Does ask for Lift Chair to get up.  Still drives, gets out of the house once per wk to [place] night.  Wife wont go with him and stays busy otherwise  Son is not much help overall.   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   Past Medical History:  Diagnosis Date  . ALLERGIC RHINITIS 06/24/2007  . 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 ANXIETY 06/24/2007  . Arthritis    "hands, knees, hips, neck; q part of the body" (01/27/2015)  . CELLULITIS AND ABSCESS OF LEG EXCEPT FOOT 03/28/2010  . Cellulitis of left leg  01/27/2015  . CHF (congestive heart failure) ([place])   . Chronic lower back pain    "I have a steel rod in my back"  . Claudication ([place]) 06/02/2011  . COLONIC POLYPS, HX OF 06/24/2007  . Complication of anesthesia    "they can't put me asleep cause I don't wake up" (01/27/2015)  . CONSTIPATION, CHRONIC, HX OF 06/24/2007  . COPD (chronic obstructive pulmonary disease) ([place])   . Coronary artery disease   . CVA (cerebral vascular accident) [facility]) 1990's   "w/MI"; denies residual on 01/27/2015  . DEPRESSION 06/24/2007  . DIABETES MELLITUS, TYPE II dx'd 2015  . DIVERTICULOSIS, COLON 06/24/2007  . DVT (deep venous thrombosis) ([place])   . Heart murmur   . HYPERLIPIDEMIA 06/24/2007  . HYPERTENSION 06/24/2007  . INSOMNIA-SLEEP DISORDER-UNSPEC 01/24/2010  . Migraine    "comes over my right eye q now and then" (01/27/2015)  . Morbid obesity ([place]) 12/21/2015  . Myocardial infarction ([place]) 1990's X 1   "w/stroke"  . OSA (obstructive sleep apnea)   . SKIN LESION 01/24/2010  . Unspecified Peripheral Vascular Disease 06/24/2007   Past Surgical History:  Procedure Laterality Date  . ANTERIOR CERVICAL DECOMP/DISCECTOMY FUSION  1985   "job related injury"  . BACK SURGERY    . KNEE ARTHROSCOPY Bilateral 1990  . LIGAMENT REPAIR Right 1999   "wrist; job related injury"  . LUMBAR LAMINECTOMY  1990's    reports that he has  been smoking Cigars.  He has smoked for the past 48.00 years. He has never used smokeless tobacco. He reports that he drinks alcohol. He reports that he does not use drugs. family history includes COPD in his father; Cancer in his mother; Heart attack in his brother; Heart disease in his brother, father, maternal grandfather, and maternal grandmother; Hypertension in his brother; Peripheral vascular disease in his brother. Allergies  Allergen Reactions  . Oxycodone Other (See Comments)    Sedation/somnolence   Current Outpatient Prescriptions on File Prior to Visit  Medication Sig Dispense Refill    . albuterol (PROVENTIL HFA;VENTOLIN HFA) 108 (90 BASE) MCG/ACT inhaler Inhale 2 puffs into the lungs every 6 (six) hours as needed for wheezing or shortness of breath. 1 Inhaler 11  . aspirin 81 MG tablet Take 81 mg by mouth daily.      . Bisacodyl (LAXATIVE PO) Take 1 capsule by mouth daily.    . Blood Glucose Monitoring Suppl DEVI 1 Device by Does not apply route daily. 1 each 0  . budesonide-formoterol (SYMBICORT) 160-4.5 MCG/ACT inhaler Inhale 2 puffs into the lungs 2 (two) times daily. 1 Inhaler 11  . clonazePAM (KLONOPIN) 0.5 MG tablet TAKE 1-2 TABLETS BY MOUTH AT BEDTIME. CAN TAKE UP TO 2 DEPENDING ON ANXIETY LEVEL 60 tablet 5  . fenofibrate 160 MG tablet Take 1 tablet (160 mg total) by mouth daily. 90 tablet 1  . furosemide (LASIX) 20 MG tablet Take 0.5 tablets (10 mg total) by mouth daily. 30 tablet 5  . gabapentin (NEURONTIN) 100 MG capsule Take 2 capsules (200 mg total) by mouth at bedtime. Yearly physical w/labs due in May must see MD for refills 60 capsule 0  . glipiZIDE (GLIPIZIDE XL) 2.5 MG 24 hr tablet Take 1 tablet (2.5 mg total) by mouth daily with breakfast. 90 tablet 3  . glucose blood test strip Use as instructed 100 each 12  . hydrochlorothiazide (HYDRODIURIL) 25 MG tablet Take 1 tablet (25 mg total) by mouth daily. Yearly physical w/labs due in May must see MD for refills 30 tablet 0  . HYDROcodone-acetaminophen (NORCO/VICODIN) 5-325 MG per tablet Take 1 tablet by mouth every 4 (four) hours. 30 tablet 0  . labetalol (NORMODYNE) 300 MG tablet Take 1 tablet (300 mg total) by mouth 2 (two) times daily. 60 tablet 6  . metFORMIN (GLUCOPHAGE) 1000 MG tablet Take 1 tablet (1,000 mg total) by mouth 2 (two) times daily with a meal. 180 tablet 2  . pioglitazone (ACTOS) 30 MG tablet Take 1 tablet by mouth daily 90 tablet 1  . simvastatin (ZOCOR) 80 MG tablet Take 1 tablet (80 mg total) by mouth at bedtime. 90 tablet 1   No current facility-administered medications on file prior to  visit.    Review of Systems Constitutional: Negative for other unusual diaphoresis, sweats, appetite or weight changes HENT: Negative for other worsening hearing loss, ear pain, facial swelling, mouth sores or neck stiffness.   Eyes: Negative for other worsening pain, redness or other visual disturbance.  Respiratory: Negative for other stridor or swelling Cardiovascular: Negative for other palpitations or other chest pain  Gastrointestinal: Negative for worsening diarrhea or loose stools, blood in stool, distention or other pain Genitourinary: Negative for hematuria, flank pain or other change in urine volume.  Musculoskeletal: Negative for myalgias or other joint swelling.  Skin: Negative for other color change, or other wound or worsening drainage.  Neurological: Negative for other syncope or numbness. Hematological: Negative for other adenopathy  or swelling Psychiatric/Behavioral: Negative for hallucinations, other worsening agitation, SI, self-injury, or new decreased concentration All other system neg per pt    Objective:   Physical Exam BP (!) 152/82   Pulse 72   Ht 6' (1.829 m)   Wt (!) 323 lb (146.5 kg)   SpO2 98%   BMI 43.81 kg/m  VS noted, morbid obese, moves slowly with cane use, able for up on exam table with mild assist Constitutional: Pt is oriented to person, place, and time. Appears well-developed and well-nourished, in no significant distress and comfortable Head: Normocephalic and atraumatic  Eyes: Conjunctivae and EOM are normal. Pupils are equal, round, and reactive to light Right Ear: External ear normal without discharge Left Ear: External ear normal without discharge Nose: Nose without discharge or deformity Mouth/Throat: Oropharynx is without other ulcerations and moist  Neck: Normal range of motion. Neck supple. No JVD present. No tracheal deviation present or significant neck LA or mass Cardiovascular: Normal rate, regular rhythm, normal heart sounds and  intact distal pulses.   Pulmonary/Chest: WOB normal and breath sounds without rales or wheezing  Abdominal: Soft. Bowel sounds are normal. NT. No HSM  Musculoskeletal: Normal range of motion. Exhibits no edema Lymphadenopathy: Has no other cervical adenopathy.  Neurological: Pt is alert and oriented to person, place, and time. Pt has normal reflexes. No cranial nerve deficit. Motor grossly intact, Gait intact Skin: Skin is warm and dry. No rash noted or new ulcerations Psychiatric:  Has normal mood and affect. Behavior is normal without agitation No other exam findings Lab Results  Component Value Date   WBC 4.7 01/25/2017   HGB 15.9 01/25/2017   HCT 46.7 01/25/2017   PLT 80.0 (L) 01/25/2017   GLUCOSE 91 01/25/2017   CHOL 126 01/25/2017   TRIG 141.0 01/25/2017   HDL 34.00 (L) 01/25/2017   LDLDIRECT 75.0 06/22/2016   LDLCALC 63 01/25/2017   ALT 14 01/25/2017   AST 20 01/25/2017   NA 138 01/25/2017   K 3.8 01/25/2017   CL 101 01/25/2017   CREATININE 1.06 01/25/2017   BUN 15 01/25/2017   CO2 29 01/25/2017   TSH 3.12 01/25/2017   PSA 0.20 01/25/2017   INR 1.07 02/01/2015   HGBA1C 6.1 01/25/2017   MICROALBUR 3.1 (H) 01/25/2017       Assessment & Plan:

## 2017-01-26 NOTE — Assessment & Plan Note (Signed)
With known peripheral neuropathy, stable overall by history and exam, recent data reviewed with pt, and pt to continue medical treatment as before,  to f/u any worsening symptoms or concerns

## 2017-01-26 NOTE — Assessment & Plan Note (Signed)

## 2017-02-16 ENCOUNTER — Other Ambulatory Visit: Payer: Self-pay | Admitting: Internal Medicine

## 2017-02-16 DIAGNOSIS — R609 Edema, unspecified: Secondary | ICD-10-CM

## 2017-02-23 ENCOUNTER — Ambulatory Visit: Payer: Medicare Other | Admitting: Internal Medicine

## 2017-03-27 ENCOUNTER — Other Ambulatory Visit: Payer: Self-pay | Admitting: Internal Medicine

## 2017-05-22 ENCOUNTER — Other Ambulatory Visit: Payer: Self-pay | Admitting: Internal Medicine

## 2017-05-22 NOTE — Telephone Encounter (Signed)
faxed

## 2017-05-22 NOTE — Telephone Encounter (Signed)
Done hardcopy to Shirron  

## 2017-06-21 ENCOUNTER — Ambulatory Visit (INDEPENDENT_AMBULATORY_CARE_PROVIDER_SITE_OTHER): Payer: Medicare Other | Admitting: General Practice

## 2017-06-21 DIAGNOSIS — Z23 Encounter for immunization: Secondary | ICD-10-CM

## 2017-06-26 ENCOUNTER — Other Ambulatory Visit: Payer: Self-pay | Admitting: Internal Medicine

## 2017-07-25 ENCOUNTER — Other Ambulatory Visit: Payer: Self-pay | Admitting: Internal Medicine

## 2017-08-01 ENCOUNTER — Ambulatory Visit: Payer: Medicare Other | Admitting: Internal Medicine

## 2017-08-31 ENCOUNTER — Ambulatory Visit (INDEPENDENT_AMBULATORY_CARE_PROVIDER_SITE_OTHER): Payer: Medicare Other | Admitting: Internal Medicine

## 2017-08-31 ENCOUNTER — Other Ambulatory Visit (INDEPENDENT_AMBULATORY_CARE_PROVIDER_SITE_OTHER): Payer: Medicare Other

## 2017-08-31 ENCOUNTER — Ambulatory Visit (INDEPENDENT_AMBULATORY_CARE_PROVIDER_SITE_OTHER)
Admission: RE | Admit: 2017-08-31 | Discharge: 2017-08-31 | Disposition: A | Discharge status: Home / Self Care | Payer: Medicare Other | Source: Ambulatory Visit | Attending: Internal Medicine | Admitting: Internal Medicine

## 2017-08-31 ENCOUNTER — Encounter: Payer: Self-pay | Admitting: Internal Medicine

## 2017-08-31 VITALS — BP 136/82 | HR 87 | Temp 98.0°F | Ht 72.0 in | Wt 324.0 lb

## 2017-08-31 DIAGNOSIS — R609 Edema, unspecified: Secondary | ICD-10-CM | POA: Insufficient documentation

## 2017-08-31 DIAGNOSIS — G8929 Other chronic pain: Secondary | ICD-10-CM

## 2017-08-31 DIAGNOSIS — J449 Chronic obstructive pulmonary disease, unspecified: Secondary | ICD-10-CM

## 2017-08-31 DIAGNOSIS — D696 Thrombocytopenia, unspecified: Secondary | ICD-10-CM

## 2017-08-31 DIAGNOSIS — M179 Osteoarthritis of knee, unspecified: Secondary | ICD-10-CM | POA: Diagnosis not present

## 2017-08-31 DIAGNOSIS — M545 Low back pain, unspecified: Secondary | ICD-10-CM

## 2017-08-31 DIAGNOSIS — M25562 Pain in left knee: Secondary | ICD-10-CM

## 2017-08-31 DIAGNOSIS — R0600 Dyspnea, unspecified: Secondary | ICD-10-CM

## 2017-08-31 DIAGNOSIS — R0602 Shortness of breath: Secondary | ICD-10-CM | POA: Diagnosis not present

## 2017-08-31 DIAGNOSIS — L03115 Cellulitis of right lower limb: Secondary | ICD-10-CM | POA: Diagnosis not present

## 2017-08-31 DIAGNOSIS — I714 Abdominal aortic aneurysm, without rupture, unspecified: Secondary | ICD-10-CM

## 2017-08-31 DIAGNOSIS — M25561 Pain in right knee: Secondary | ICD-10-CM

## 2017-08-31 DIAGNOSIS — E1140 Type 2 diabetes mellitus with diabetic neuropathy, unspecified: Secondary | ICD-10-CM

## 2017-08-31 DIAGNOSIS — I10 Essential (primary) hypertension: Secondary | ICD-10-CM | POA: Diagnosis not present

## 2017-08-31 LAB — CBC WITH DIFFERENTIAL/PLATELET
BASOS ABS: 0 10*3/uL (ref 0.0–0.1)
Basophils Relative: 0.6 % (ref 0.0–3.0)
Eosinophils Absolute: 0.1 10*3/uL (ref 0.0–0.7)
Eosinophils Relative: 2.4 % (ref 0.0–5.0)
HCT: 43.8 % (ref 39.0–52.0)
Hemoglobin: 14.9 g/dL (ref 13.0–17.0)
LYMPHS ABS: 1.1 10*3/uL (ref 0.7–4.0)
Lymphocytes Relative: 26.5 % (ref 12.0–46.0)
MCHC: 34 g/dL (ref 30.0–36.0)
MCV: 106.4 fl — AB (ref 78.0–100.0)
MONO ABS: 0.4 10*3/uL (ref 0.1–1.0)
MONOS PCT: 8.5 % (ref 3.0–12.0)
NEUTROS ABS: 2.6 10*3/uL (ref 1.4–7.7)
NEUTROS PCT: 62 % (ref 43.0–77.0)
PLATELETS: 83 10*3/uL — AB (ref 150.0–400.0)
RBC: 4.12 Mil/uL — ABNORMAL LOW (ref 4.22–5.81)
RDW: 13.5 % (ref 11.5–15.5)
WBC: 4.3 10*3/uL (ref 4.0–10.5)

## 2017-08-31 LAB — LIPID PANEL
CHOLESTEROL: 114 mg/dL (ref 0–200)
HDL: 33.1 mg/dL — ABNORMAL LOW (ref >39.00)
LDL CALC: 46 mg/dL (ref 0–99)
NONHDL: 81.09
Total CHOL/HDL Ratio: 3
Triglycerides: 173 mg/dL — ABNORMAL HIGH (ref 0.0–149.0)
VLDL: 34.6 mg/dL (ref 0.0–40.0)

## 2017-08-31 LAB — BASIC METABOLIC PANEL
BUN: 22 mg/dL (ref 6–23)
CALCIUM: 9.3 mg/dL (ref 8.4–10.5)
CO2: 26 mEq/L (ref 19–32)
CREATININE: 1.28 mg/dL (ref 0.40–1.50)
Chloride: 99 mEq/L (ref 96–112)
GFR: 59.49 mL/min — ABNORMAL LOW (ref >60.00)
Glucose, Bld: 87 mg/dL (ref 70–99)
Potassium: 3.7 mEq/L (ref 3.5–5.1)
Sodium: 136 mEq/L (ref 135–145)

## 2017-08-31 LAB — HEPATIC FUNCTION PANEL
ALBUMIN: 4.5 g/dL (ref 3.5–5.2)
ALK PHOS: 39 U/L (ref 39–117)
ALT: 12 U/L (ref 0–53)
AST: 19 U/L (ref 0–37)
BILIRUBIN TOTAL: 0.5 mg/dL (ref 0.2–1.2)
Bilirubin, Direct: 0.1 mg/dL (ref 0.0–0.3)
Total Protein: 6.8 g/dL (ref 6.0–8.3)

## 2017-08-31 LAB — BRAIN NATRIURETIC PEPTIDE: Pro B Natriuretic peptide (BNP): 150 pg/mL — ABNORMAL HIGH (ref 0.0–100.0)

## 2017-08-31 LAB — HEMOGLOBIN A1C: Hgb A1c MFr Bld: 5.7 % (ref 4.6–6.5)

## 2017-08-31 MED ORDER — HYDROCHLOROTHIAZIDE 25 MG PO TABS: 25.0000 mg | ORAL_TABLET | Freq: Every day | ORAL | 3 refills | Status: DC

## 2017-08-31 NOTE — Patient Instructions (Addendum)
You had the Pneumovax pneumonia shot today  OK to stop the actos 30mg    Please continue all other medications as before, and refills have been done if requested.  Please have the pharmacy call with any other refills you may need.  Please continue your efforts at being more active, low cholesterol diabetic diet, and weight control.  Please keep your appointments with your specialists as you may have planned  You will be contacted regarding the referral for: cardiology, orthopedic, and echocardiogram, aorta ultrasound, and lumbar MRI for the lower back  Please go to the XRAY Department in the Basement (go straight as you get off the elevator) for the x-ray testing  Please go to the LAB in the Basement (turn left off the elevator) for the tests to be done today  You will be contacted by phone if any changes need to be made immediately.  Otherwise, you will receive a letter about your results with an explanation, but please check with MyChart first.  Please remember to sign up for MyChart if you have not done so, as this will be important to you in the future with finding out test results, communicating by private email, and scheduling acute appointments online when needed.  Please return in 6 months, or sooner if needed

## 2017-08-31 NOTE — Progress Notes (Signed)
Subjective:    Patient ID: Eric [name], male    DOB: 1951/02/06, 67 y.o.   MRN: [id number]  HPI  Here to f/u; overall doing ok,  Pt denies chest pain, increasing sob or doe, wheezing, orthopnea, PND, palpitations, dizziness or syncope.  Pt denies new neurological symptoms such as new headache, or facial or extremity weakness or numbness.  Pt denies polydipsia, polyuria, but having significant low sugar episode "several" times per wk.  Pt states overall fair compliance with meds, mostly trying to follow appropriate diet, with wt overall stable,  but little exercise however. Walks with cane, can no longer walk well without it due to chronic LBP and knee pain. Has wheelchair at home   Also - out of HCTZ for 2 wks due to some refill confusion at the pharmacy regarding refills per pt.  Gets SOB at 50 ft and with 2 wks worsening bilat right > left edema, can no longer go shopping with wife, wondering if now needs scooter. Last knee xrays 2015 with DJD.  Has seen sports medicine then, but as with vascular surgury, he is not consistently compliant with physician followup. Does also have acute onset mild constant right heat, pain, redness and swelling to pretibial area above the ankle, without ulcer, drainage, fever, though did have 2 small blister like areas to pretibial right side below knee that now seem healed.  No overt bleeding or bruising.   Past Medical History:  Diagnosis Date  . ALLERGIC RHINITIS 06/24/2007  . 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 ANXIETY 06/24/2007  . Arthritis    "hands, knees, hips, neck; q part of the body" (01/27/2015)  . CELLULITIS AND ABSCESS OF LEG EXCEPT FOOT 03/28/2010  . Cellulitis of left leg 01/27/2015  . CHF (congestive heart failure) ([place])   . Chronic lower back pain    "I have a steel rod in my back"  . Claudication ([place]) 06/02/2011  . COLONIC POLYPS, HX OF 06/24/2007  . Complication of anesthesia    "they can't put me asleep cause I don't wake up" (01/27/2015)  . CONSTIPATION,  CHRONIC, HX OF 06/24/2007  . COPD (chronic obstructive pulmonary disease) ([place])   . Coronary artery disease   . CVA (cerebral vascular accident) [facility]) 1990's   "w/MI"; denies residual on 01/27/2015  . DEPRESSION 06/24/2007  . DIABETES MELLITUS, TYPE II dx'd 2015  . DIVERTICULOSIS, COLON 06/24/2007  . DVT (deep venous thrombosis) ([place])   . Heart murmur   . HYPERLIPIDEMIA 06/24/2007  . HYPERTENSION 06/24/2007  . INSOMNIA-SLEEP DISORDER-UNSPEC 01/24/2010  . Migraine    "comes over my right eye q now and then" (01/27/2015)  . Morbid obesity ([place]) 12/21/2015  . Myocardial infarction ([place]) 1990's X 1   "w/stroke"  . OSA (obstructive sleep apnea)   . SKIN LESION 01/24/2010  . Unspecified Peripheral Vascular Disease 06/24/2007   Past Surgical History:  Procedure Laterality Date  . ANTERIOR CERVICAL DECOMP/DISCECTOMY FUSION  1985   "job related injury"  . BACK SURGERY    . KNEE ARTHROSCOPY Bilateral 1990  . LIGAMENT REPAIR Right 1999   "wrist; job related injury"  . LUMBAR LAMINECTOMY  1990's    reports that he has been smoking cigars.  He has smoked for the past 48.00 years. he has never used smokeless tobacco. He reports that he drinks alcohol. He reports that he does not use drugs. family history includes COPD in his father; Cancer in his mother; Heart attack in his brother; Heart disease in his  brother, father, maternal grandfather, and maternal grandmother; Hypertension in his brother; Peripheral vascular disease in his brother. Allergies  Allergen Reactions  . Oxycodone Other (See Comments)    Sedation/somnolence   Current Outpatient Medications on File Prior to Visit  Medication Sig Dispense Refill  . albuterol (PROVENTIL HFA;VENTOLIN HFA) 108 (90 BASE) MCG/ACT inhaler Inhale 2 puffs into the lungs every 6 (six) hours as needed for wheezing or shortness of breath. 1 Inhaler 11  . aspirin 81 MG tablet Take 81 mg by mouth daily.      . Bisacodyl (LAXATIVE PO) Take 1 capsule by mouth daily.      . Blood Glucose Monitoring Suppl (ACCU-CHEK NANO SMARTVIEW) w/Device KIT use as directed to check insulin levels 1 kit 0  . Blood Glucose Monitoring Suppl DEVI 1 Device by Does not apply route daily. 1 each 0  . budesonide-formoterol (SYMBICORT) 160-4.5 MCG/ACT inhaler Inhale 2 puffs into the lungs 2 (two) times daily. 1 Inhaler 11  . clonazePAM (KLONOPIN) 0.5 MG tablet take 1-2 TABLET BY MOUTH at bedtime. can take up to 2 depending on anxiety 60 tablet 5  . fenofibrate 160 MG tablet Take 1 tablet (160 mg total) by mouth daily. 90 tablet 3  . furosemide (LASIX) 20 MG tablet Take 0.5 tablets (10 mg total) by mouth daily. 30 tablet 5  . gabapentin (NEURONTIN) 100 MG capsule Take 2 capsules (200 mg total) by mouth at bedtime. Yearly physical w/labs due in May must see MD for refills 60 capsule 0  . gabapentin (NEURONTIN) 100 MG capsule Take 2 capsules (200 mg total) by mouth at bedtime. 180 capsule 3  . glipiZIDE (GLUCOTROL XL) 2.5 MG 24 hr tablet Take 1 tablet (2.5 mg total) by mouth daily with breakfast. 90 tablet 1  . glucose blood test strip Use as instructed 100 each 12  . HYDROcodone-acetaminophen (NORCO/VICODIN) 5-325 MG per tablet Take 1 tablet by mouth every 4 (four) hours. 30 tablet 0  . labetalol (NORMODYNE) 300 MG tablet Take 1 tablet (300 mg total) by mouth 2 (two) times daily. 60 tablet 5  . Lancets (ACCU-CHEK SOFT TOUCH) lancets Use as instructed 100 each 12  . metFORMIN (GLUCOPHAGE) 1000 MG tablet Take 1 tablet (1,000 mg total) by mouth 2 (two) times daily with a meal. 180 tablet 1  . simvastatin (ZOCOR) 80 MG tablet Take 1 tablet (80 mg total) by mouth at bedtime. 30 tablet 5   No current facility-administered medications on file prior to visit.    Review of Systems  Constitutional: Negative for other unusual diaphoresis or sweats HENT: Negative for ear discharge or swelling Eyes: Negative for other worsening visual disturbances Respiratory: Negative for stridor or other  swelling  Gastrointestinal: Negative for worsening distension or other blood Genitourinary: Negative for retention or other urinary change Musculoskeletal: Negative for other MSK pain or swelling Skin: Negative for color change or other new lesions Neurological: Negative for worsening tremors and other numbness  Psychiatric/Behavioral: Negative for worsening agitation or other fatigue All other system neg per pt    Objective:   Physical Exam BP 136/82   Pulse 87   Temp 98 F (36.7 C) (Oral)   Ht 6' (1.829 m)   Wt (!) 324 lb (147 kg)   SpO2 98%   BMI 43.94 kg/m  VS noted, walks with cane Constitutional: Pt appears in NAD HENT: Head: NCAT.  Right Ear: External ear normal.  Left Ear: External ear normal.  Eyes: . Pupils are equal, round,  and reactive to light. Conjunctivae and EOM are normal Nose: without d/c or deformity Neck: Neck supple. Gross normal ROM Cardiovascular: Normal rate and regular rhythm.   Pulmonary/Chest: Effort normal and breath sounds without rales or wheezing.  Abd:  Soft, NT, ND, + BS, no organomegaly Neurological: Pt is alert. At baseline orientation, motor grossly intact Skin: Skin is warm. No rashes, other new lesions, has 2-3+ bialt LE edema right > left with RLE with distal pretibial area 6x8 cm warm, tender, swollen erythema; appears to have 2 healed areas of recent blistering below the right knee Psychiatric: Pt behavior is normal without agitation , mod nervous No other exam findings    Assessment & Plan:

## 2017-09-02 ENCOUNTER — Encounter: Payer: Self-pay | Admitting: Internal Medicine

## 2017-09-02 NOTE — Assessment & Plan Note (Signed)
Stable by exam, cont same tx,  to f/u any worsening symptoms or concerns

## 2017-09-02 NOTE — Assessment & Plan Note (Signed)
Likely due to med noncompliance, likely diast CHF?,m to restart diuretic, refer cardiology

## 2017-09-02 NOTE — Assessment & Plan Note (Signed)
Chronic persistent, will need ortho eval prior to any scooter evaluation

## 2017-09-02 NOTE — Assessment & Plan Note (Addendum)
Lab Results  Component Value Date   HGBA1C 5.7 08/31/2017  stable overall by history and exam, recent data reviewed with pt, and pt to continue medical treatment as before except to d/c actos due to low sugars and worsening edema,  to f/u any worsening symptoms or concerns

## 2017-09-02 NOTE — Assessment & Plan Note (Addendum)
Otherwise etiology not certain, could be multi factorial with copd and cardiac/volume increase; also for cxr, labs today as ordered  Note:  Total time for pt hx, exam, review of record with pt in the room, determination of diagnoses and plan for further eval and tx is > 40 min, with over 50% spent in coordination and counseling of patient including the differential dx, tx, further evaluation and other management of dyspnea, copd, edema, cellulitis, TCP, AAA, chronic lbp and DM

## 2017-09-02 NOTE — Assessment & Plan Note (Signed)
Mild,. For f/u with cbc today

## 2017-09-02 NOTE — Assessment & Plan Note (Signed)
New onset, Mild to mod, for antibx course,  to f/u any worsening symptoms or concerns

## 2017-09-02 NOTE — Assessment & Plan Note (Signed)
Without recent f/u, for f/u imaging and vasc surgury referral

## 2017-09-02 NOTE — Assessment & Plan Note (Signed)
BP Readings from Last 3 Encounters:  08/31/17 136/82  01/25/17 (!) 152/82  06/22/16 140/80  stable overall by history and exam, recent data reviewed with pt, and pt to continue medical treatment as before,  to f/u any worsening symptoms or concerns

## 2017-09-12 ENCOUNTER — Telehealth: Payer: Self-pay | Admitting: Internal Medicine

## 2017-09-12 MED ORDER — FUROSEMIDE 20 MG PO TABS: 10.0000 mg | ORAL_TABLET | Freq: Every day | ORAL | 5 refills | Status: AC

## 2017-09-12 NOTE — Telephone Encounter (Signed)
Copied from [place]. Topic: Quick Communication - See Telephone Encounter >> Sep 12, 2017  3:01 PM Bea Graff, NT wrote: CRM for notification. See Telephone encounter for: [facility] calling on behalf of the pt to get a refill of furosemide (LASIX). The pharmacy sent over a renewal for it and has not heard anything back.   09/12/17.

## 2017-10-20 ENCOUNTER — Other Ambulatory Visit: Payer: Self-pay | Admitting: Internal Medicine

## 2017-10-22 NOTE — Telephone Encounter (Signed)
Done erx 

## 2017-11-07 ENCOUNTER — Telehealth: Payer: Self-pay | Admitting: Internal Medicine

## 2017-11-07 NOTE — Telephone Encounter (Signed)
We received a parking placard in the mail for renewal. Form has been completed & placed in providers box to review and approve.   Patient would like the form mailed back to him once completed.

## 2017-11-07 NOTE — Telephone Encounter (Signed)
Will sign likely later today

## 2017-11-08 NOTE — Telephone Encounter (Signed)
Copy sent to scan & original mailed to patient.

## 2017-11-16 ENCOUNTER — Other Ambulatory Visit: Payer: Self-pay | Admitting: Internal Medicine

## 2017-12-09 ENCOUNTER — Emergency Department (HOSPITAL_COMMUNITY): Payer: Medicare Other

## 2017-12-09 ENCOUNTER — Encounter (HOSPITAL_COMMUNITY): Payer: Self-pay | Admitting: Emergency Medicine

## 2017-12-09 ENCOUNTER — Emergency Department (HOSPITAL_COMMUNITY)
Admission: EM | Admit: 2017-12-09 | Discharge: 2017-12-10 | Disposition: A | Discharge status: Home / Self Care | Payer: Medicare Other | Attending: Emergency Medicine | Admitting: Emergency Medicine

## 2017-12-09 DIAGNOSIS — M79605 Pain in left leg: Secondary | ICD-10-CM | POA: Insufficient documentation

## 2017-12-09 DIAGNOSIS — I110 Hypertensive heart disease with heart failure: Secondary | ICD-10-CM | POA: Diagnosis not present

## 2017-12-09 DIAGNOSIS — D696 Thrombocytopenia, unspecified: Secondary | ICD-10-CM | POA: Diagnosis not present

## 2017-12-09 DIAGNOSIS — Z8673 Personal history of transient ischemic attack (TIA), and cerebral infarction without residual deficits: Secondary | ICD-10-CM | POA: Insufficient documentation

## 2017-12-09 DIAGNOSIS — F17210 Nicotine dependence, cigarettes, uncomplicated: Secondary | ICD-10-CM | POA: Diagnosis not present

## 2017-12-09 DIAGNOSIS — Z7984 Long term (current) use of oral hypoglycemic drugs: Secondary | ICD-10-CM | POA: Insufficient documentation

## 2017-12-09 DIAGNOSIS — E119 Type 2 diabetes mellitus without complications: Secondary | ICD-10-CM | POA: Insufficient documentation

## 2017-12-09 DIAGNOSIS — R609 Edema, unspecified: Secondary | ICD-10-CM | POA: Diagnosis not present

## 2017-12-09 DIAGNOSIS — M79662 Pain in left lower leg: Secondary | ICD-10-CM | POA: Diagnosis not present

## 2017-12-09 DIAGNOSIS — Z79899 Other long term (current) drug therapy: Secondary | ICD-10-CM | POA: Diagnosis not present

## 2017-12-09 DIAGNOSIS — R296 Repeated falls: Secondary | ICD-10-CM | POA: Diagnosis not present

## 2017-12-09 DIAGNOSIS — I10 Essential (primary) hypertension: Secondary | ICD-10-CM | POA: Diagnosis not present

## 2017-12-09 DIAGNOSIS — J449 Chronic obstructive pulmonary disease, unspecified: Secondary | ICD-10-CM | POA: Diagnosis not present

## 2017-12-09 DIAGNOSIS — Z9181 History of falling: Secondary | ICD-10-CM | POA: Insufficient documentation

## 2017-12-09 DIAGNOSIS — R600 Localized edema: Secondary | ICD-10-CM | POA: Diagnosis not present

## 2017-12-09 DIAGNOSIS — I509 Heart failure, unspecified: Secondary | ICD-10-CM | POA: Insufficient documentation

## 2017-12-09 DIAGNOSIS — R2242 Localized swelling, mass and lump, left lower limb: Secondary | ICD-10-CM | POA: Diagnosis not present

## 2017-12-09 LAB — BASIC METABOLIC PANEL
Anion gap: 11 (ref 5–15)
BUN: 15 mg/dL (ref 6–20)
CALCIUM: 9.1 mg/dL (ref 8.9–10.3)
CO2: 23 mmol/L (ref 22–32)
Chloride: 103 mmol/L (ref 101–111)
Creatinine, Ser: 1.27 mg/dL — ABNORMAL HIGH (ref 0.61–1.24)
GFR calc Af Amer: >60 mL/min (ref >60)
GFR, EST NON AFRICAN AMERICAN: 57 mL/min — AB (ref >60)
GLUCOSE: 222 mg/dL — AB (ref 65–99)
POTASSIUM: 3.2 mmol/L — AB (ref 3.5–5.1)
Sodium: 137 mmol/L (ref 135–145)

## 2017-12-09 LAB — CBC
HEMATOCRIT: 45.1 % (ref 39.0–52.0)
Hemoglobin: 14.9 g/dL (ref 13.0–17.0)
MCH: 34.3 pg — AB (ref 26.0–34.0)
MCHC: 33 g/dL (ref 30.0–36.0)
MCV: 103.7 fL — ABNORMAL HIGH (ref 78.0–100.0)
Platelets: 68 10*3/uL — ABNORMAL LOW (ref 150–400)
RBC: 4.35 MIL/uL (ref 4.22–5.81)
RDW: 13.5 % (ref 11.5–15.5)
WBC: 4.8 10*3/uL (ref 4.0–10.5)

## 2017-12-09 LAB — I-STAT TROPONIN, ED: Troponin i, poc: 0.01 ng/mL (ref 0.00–0.08)

## 2017-12-09 NOTE — ED Triage Notes (Signed)
Patient also c/o leg swelling and states he has not been taking his prescribed fluid pills due to remodeling the bathroom andb eing on vacation.  +2 pitting edema noted

## 2017-12-09 NOTE — ED Triage Notes (Signed)
Patient presents to ED after having multiple falls in the past 4 days.  Patient states he stubbed his foot and shin trying to get out of the shower, so he is having lower leg numbness.  Patient also c/o left hip, groin, buttocks and lower back pain.  Patient also states he slipped on a blanket and fell on to carpet and hit his head on the bed (abrasion noted to left forehead),  Denies n/v, denies dizziness, denies changes in mental status, denies LOC

## 2017-12-10 ENCOUNTER — Ambulatory Visit (HOSPITAL_BASED_OUTPATIENT_CLINIC_OR_DEPARTMENT_OTHER)
Admission: RE | Admit: 2017-12-10 | Discharge: 2017-12-10 | Disposition: A | Discharge status: Home / Self Care | Payer: Medicare Other | Source: Ambulatory Visit | Attending: Emergency Medicine | Admitting: Emergency Medicine

## 2017-12-10 ENCOUNTER — Telehealth: Payer: Self-pay | Admitting: Oncology

## 2017-12-10 DIAGNOSIS — M7989 Other specified soft tissue disorders: Secondary | ICD-10-CM

## 2017-12-10 MED ORDER — HYDROCODONE-ACETAMINOPHEN 5-325 MG PO TABS
1.0000 | ORAL_TABLET | Freq: Once | ORAL | Status: AC
  Administered 2017-12-10: 1 via ORAL
  Filled 2017-12-10: qty 1

## 2017-12-10 MED ORDER — HYDROCODONE-ACETAMINOPHEN 5-325 MG PO TABS
1.0000 | ORAL_TABLET | ORAL | 0 refills | Status: DC | PRN
Start: 2017-12-10 — End: 2018-01-04

## 2017-12-10 NOTE — Progress Notes (Signed)
*  Preliminary Results* Left lower extremity venous duplex completed. Study was limited due to patient's inability/refusal to get into acceptable position for exam.  Visualized veins of the left lower extremity are negative for deep vein thrombosis. There is no evidence of leftBaker's cyst.  12/10/2017 9:11 AM  Maudry Mayhew, BS, RVT, RDCS, RDMS

## 2017-12-10 NOTE — Telephone Encounter (Signed)
Patients wife called in to scheduled appointment from referral at ED 4/21.

## 2017-12-10 NOTE — ED Notes (Signed)
Pt verbalizes understanding of d/c instructions. Pt received prescriptions. Pt taken to lobby in wheelchair at d/c with all belongings.   

## 2017-12-10 NOTE — ED Provider Notes (Signed)
[facility] EMERGENCY DEPARTMENT Provider Note   CSN: [id number] Arrival date & time: 12/09/17  1718     History   Chief Complaint Chief Complaint  Patient presents with  . Fall    HPI Eric [name] is a 67 y.o. male.  The patient presents with complaint of left lower extremity pain that includes lower leg swelling. Symptoms started over the last 4-5 days. He reports coming off his Lasix 20 mg daily because he was traveling by car and didn't want to urinate frequently or 'have accidents'. There was further delay to restarting his Lasix as when he returned home the medication was displaced due to urgent bathroom renovations and this is where he kept his medicine. No SoB or chest pain. No right LE pain or swelling. He feels the pain of the left leg is augmented by an injury while getting into the shower several days ago where he hit the leg against the side of the tub. No falls. He also reports another incident where he slipped on a comforter that was on the floor while bending down to straighten in, causing him to lunge forward and hit his head against the bed frame. No LOC, nausea, vomiting, neck pain. He feels this made the soreness in his leg worse. He presents tonight because the lower extremity pain is uncontrolled at home with Tylenol.   The history is provided by the patient and the spouse. No language interpreter was used.  Fall  Pertinent negatives include no chest pain, no abdominal pain, no headaches and no shortness of breath.    Past Medical History:  Diagnosis Date  . ALLERGIC RHINITIS 06/24/2007  . 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 ANXIETY 06/24/2007  . Arthritis    "hands, knees, hips, neck; q part of the body" (01/27/2015)  . CELLULITIS AND ABSCESS OF LEG EXCEPT FOOT 03/28/2010  . Cellulitis of left leg 01/27/2015  . CHF (congestive heart failure) ([place])   . Chronic lower back pain    "I have a steel rod in my back"  . Claudication ([place]) 06/02/2011  . COLONIC  POLYPS, HX OF 06/24/2007  . Complication of anesthesia    "they can't put me asleep cause I don't wake up" (01/27/2015)  . CONSTIPATION, CHRONIC, HX OF 06/24/2007  . COPD (chronic obstructive pulmonary disease) ([place])   . Coronary artery disease   . CVA (cerebral vascular accident) [facility]) 1990's   "w/MI"; denies residual on 01/27/2015  . DEPRESSION 06/24/2007  . DIABETES MELLITUS, TYPE II dx'd 2015  . DIVERTICULOSIS, COLON 06/24/2007  . DVT (deep venous thrombosis) ([place])   . Heart murmur   . HYPERLIPIDEMIA 06/24/2007  . HYPERTENSION 06/24/2007  . INSOMNIA-SLEEP DISORDER-UNSPEC 01/24/2010  . Migraine    "comes over my right eye q now and then" (01/27/2015)  . Morbid obesity ([place]) 12/21/2015  . Myocardial infarction ([place]) 1990's X 1   "w/stroke"  . OSA (obstructive sleep apnea)   . SKIN LESION 01/24/2010  . Unspecified Peripheral Vascular Disease 06/24/2007    Patient Active Problem List   Diagnosis Date Noted  . Cellulitis of right leg 08/31/2017  . Dyspnea 08/31/2017  . Peripheral edema 08/31/2017  . Morbid obesity ([place]) 12/21/2015  . Lower back pain 06/22/2015  . Cough 06/22/2015  . COPD exacerbation ([place]) 06/22/2015  . Blurred vision, left eye 06/22/2015  . Preventative health care 02/10/2015  . Anxiety and depression 01/27/2015  . COPD (chronic obstructive pulmonary disease) ([place]) 01/27/2015  . Dyslipidemia 01/27/2015  .  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[place]) 00/17/4944  . Thrombocytopenia ([place]) 01/27/2015  . AAA (abdominal aortic aneurysm) ([place]) 08/28/2014  . Lumbar radiculopathy 07/20/2014  . Bilateral lower extremity edema 06/29/2014  . Bilateral knee pain 05/31/2014  . Claudication ([place]) 06/02/2011  . Obstructive sleep apnea 06/24/2007  . Essential hypertension 06/24/2007  . Allergic rhinitis 06/24/2007    Past Surgical History:  Procedure Laterality Date  . ANTERIOR CERVICAL DECOMP/DISCECTOMY FUSION  1985   "job related injury"  . BACK SURGERY    . KNEE ARTHROSCOPY  Bilateral 1990  . LIGAMENT REPAIR Right 1999   "wrist; job related injury"  . LUMBAR LAMINECTOMY  1990's        Home Medications    Prior to Admission medications   Medication Sig Start Date End Date Taking? Authorizing Provider  albuterol (PROVENTIL HFA;VENTOLIN HFA) 108 (90 BASE) MCG/ACT inhaler Inhale 2 puffs into the lungs every 6 (six) hours as needed for wheezing or shortness of breath. 06/22/15  Yes Biagio Borg, MD  aspirin 81 MG tablet Take 81 mg by mouth daily.     Yes [provider]  budesonide-formoterol (SYMBICORT) 160-4.5 MCG/ACT inhaler Inhale 2 puffs into the lungs 2 (two) times daily. 12/21/15  Yes Biagio Borg, MD  clonazePAM (KLONOPIN) 0.5 MG tablet Take 1-2 TABLET BY MOUTH at bedtime, can take up to 2 depending on anxiety 10/22/17  Yes Biagio Borg, MD  fenofibrate 160 MG tablet Take 1 tablet (160 mg total) by mouth daily. 02/16/17  Yes Biagio Borg, MD  furosemide (LASIX) 20 MG tablet Take 0.5 tablets (10 mg total) by mouth daily. 09/12/17  Yes Biagio Borg, MD  gabapentin (NEURONTIN) 100 MG capsule Take 2 capsules (200 mg total) by mouth at bedtime. 02/16/17  Yes Biagio Borg, MD  glipiZIDE (GLUCOTROL XL) 2.5 MG 24 hr tablet Take 1 tablet (2.5 mg total) by mouth daily with breakfast. 11/16/17  Yes Biagio Borg, MD  hydrochlorothiazide (HYDRODIURIL) 25 MG tablet Take 1 tablet (25 mg total) by mouth daily. 08/31/17  Yes Biagio Borg, MD  HYDROcodone-acetaminophen (NORCO/VICODIN) 5-325 MG per tablet Take 1 tablet by mouth every 4 (four) hours. 02/03/15  Yes Nita Sells, MD  labetalol (NORMODYNE) 300 MG tablet Take 1 tablet (300 mg total) by mouth 2 (two) times daily. 06/26/17  Yes Biagio Borg, MD  metFORMIN (GLUCOPHAGE) 1000 MG tablet Take 1 tablet (1,000 mg total) by mouth 2 (two) times daily with a meal. 06/26/17  Yes Biagio Borg, MD  simvastatin (ZOCOR) 80 MG tablet Take 1 tablet (80 mg total) by mouth at bedtime. 07/25/17  Yes Biagio Borg, MD  Blood  Glucose Monitoring Suppl (ACCU-CHEK NANO SMARTVIEW) w/Device KIT use as directed to check insulin levels 03/27/17   Biagio Borg, MD  Blood Glucose Monitoring Suppl DEVI 1 Device by Does not apply route daily. 02/10/15   Biagio Borg, MD  gabapentin (NEURONTIN) 100 MG capsule Take 2 capsules (200 mg total) by mouth at bedtime. Yearly physical w/labs due in May must see MD for refills Patient not taking: Reported on 12/10/2017 12/18/16   Biagio Borg, MD  glucose blood test strip Use as instructed 02/10/15   Biagio Borg, MD  Lancets (ACCU-CHEK SOFT [facility]) lancets Use as instructed 01/25/17   Biagio Borg, MD    Family History Family History  Problem Relation Age of Onset  . COPD Father   . Heart disease Father   .  Cancer Mother        lung cancer  . Heart disease Maternal Grandmother   . Heart disease Maternal Grandfather   . Heart disease Brother   . Hypertension Brother   . Heart attack Brother   . Peripheral vascular disease Brother     Social History Social History   Tobacco Use  . Smoking status: Current Every Day Smoker    Years: 48.00    Types: Cigars  . Smokeless tobacco: Never Used  . Tobacco comment: 01/27/2015 pt states he smokes 5 cigar cigarettes daily  Substance Use Topics  . Alcohol use: Yes    Alcohol/week: 0.0 oz    Comment: 01/27/2015 "might have a couple beers/yr"  . Drug use: No     Allergies   Oxycodone   Review of Systems Review of Systems  Constitutional: Positive for activity change. Negative for chills and fever.  HENT: Negative.   Respiratory: Negative.  Negative for shortness of breath.   Cardiovascular: Negative.  Negative for chest pain.  Gastrointestinal: Negative.  Negative for abdominal pain, nausea and vomiting.  Genitourinary: Negative for decreased urine volume, difficulty urinating and enuresis.  Musculoskeletal: Negative for neck pain.       See HPI.  Skin: Positive for wound (Superficial wound to forehead).  Neurological: Negative.   Negative for syncope, weakness and headaches.     Physical Exam Updated Vital Signs BP (!) 198/78 (BP Location: Right Arm)   Pulse 75   Temp 98 F (36.7 C) (Oral)   Resp 18   SpO2 95%   Physical Exam  Constitutional: He is oriented to person, place, and time. He appears well-developed and well-nourished.  HENT:  Head: Normocephalic.  Neck: Normal range of motion. Neck supple.  Cardiovascular: Normal rate and regular rhythm.  Pulmonary/Chest: Effort normal and breath sounds normal. He has no wheezes. He has no rales.  Abdominal: Soft. Bowel sounds are normal. There is no tenderness. There is no rebound and no guarding.  Musculoskeletal: Normal range of motion. He exhibits edema and tenderness. He exhibits no deformity.  Left greater than right LE edema. No bony deformities. No specific calf tenderness. LE's are of equal temperature.   Neurological: He is alert and oriented to person, place, and time. No sensory deficit.  Skin: Skin is warm and dry. No rash noted.  Superficial abrasion to left forehead without hematoma.  Psychiatric: He has a normal mood and affect.     ED Treatments / Results  Labs (all labs ordered are listed, but only abnormal results are displayed) Labs Reviewed  BASIC METABOLIC PANEL - Abnormal; Notable for the following components:      Result Value   Potassium 3.2 (*)    Glucose, Bld 222 (*)    Creatinine, Ser 1.27 (*)    GFR calc non Af Amer 57 (*)    All other components within normal limits  CBC - Abnormal; Notable for the following components:   MCV 103.7 (*)    MCH 34.3 (*)    Platelets 68 (*)    All other components within normal limits  I-STAT TROPONIN, ED    EKG EKG Interpretation  Date/Time:  Sunday December 09 2017 17:49:26 EDT Ventricular Rate:  83 PR Interval:  176 QRS Duration: 122 QT Interval:  422 QTC Calculation: 495 R Axis:   -62 Text Interpretation:  Normal sinus rhythm Right bundle branch block Left anterior fascicular  block Bifascicular block  Abnormal ECG No significant change since last tracing  Confirmed by Pryor Curia [phone]) on 12/10/2017 1:41:34 AM   Radiology Dg Chest 2 View  Result Date: 12/09/2017 CLINICAL DATA:  Patient presents to ED after having multiple falls in the past 4 days. EXAM: CHEST - 2 VIEW COMPARISON:  08/31/2016 FINDINGS: Heart size is accentuated by technique and probably normal. There is persistent opacity in the RIGHT LOWER lobe, showing slight improvement compared with multiple prior studies and favored to be chronic. No new consolidations or pleural effusions. No pulmonary edema. No pneumothorax or acute displaced fracture. IMPRESSION: Stable appearance of the chest.  No evidence for acute  abnormality. Electronically Signed   By: Nolon Nations M.D.   On: 12/09/2017 18:39   Dg Knee Complete 4 Views Left  Result Date: 12/09/2017 CLINICAL DATA:  Patient presents to ED after having multiple falls in the past 4 days. EXAM: LEFT KNEE - COMPLETE 4+ VIEW COMPARISON:  08/31/2017 FINDINGS: There is significant narrowing of the LATERAL and patellofemoral compartments. No acute fracture or joint effusion. There is atherosclerotic calcification of the popliteal artery. IMPRESSION: Degenerative changes. No evidence for acute  abnormality. Electronically Signed   By: Nolon Nations M.D.   On: 12/09/2017 18:41   Dg Hip Unilat With Pelvis 2-3 Views Left  Result Date: 12/09/2017 CLINICAL DATA:  Patient presents to ED after having multiple falls in the past 4 days. EXAM: DG HIP (WITH OR WITHOUT PELVIS) 2-3V LEFT COMPARISON:  None. FINDINGS: There is no evidence of hip fracture or dislocation. There is no evidence of arthropathy or other focal bone abnormality. Remote lumbar fusion. IMPRESSION: Negative. Electronically Signed   By: Nolon Nations M.D.   On: 12/09/2017 18:37    Procedures Procedures (including critical care time)  Medications Ordered in ED Medications - No data to  display   Initial Impression / Assessment and Plan / ED Course  I have reviewed the triage vital signs and the nursing notes.  Pertinent labs & imaging results that were available during my care of the patient were reviewed by me and considered in my medical decision making (see chart for details).     Patient here with left LE pain that is keeping him awake at night. Pain x several days. Off Lasix with increased swelling. No SoB that is different from his baseline. No chest pain.   He denies history of multiple falls. He reports slipping on comforter on the floor and lunging forward while on the floor.   He is to restart his lasix tomorrow. There is no evidence of acute LE infection, pulmonary edema. VSS - no tachycardia, tachypnea or hypoxia.   Norco provided for pain. He is examined by Dr. Kathrynn Humble and is felt appropriate for discharge home. Will bring him back tomorrow for DVT study.   Patient is found to have low platelets. When compared to previous, this shows a steady decline. Will refer to heme/onc for further evaluation in the outpatient setting.   Final Clinical Impressions(s) / ED Diagnoses   Final diagnoses:  None   1. Left LE pain 2. Left LE swelling 3. Thrombocytopenia   ED Discharge Orders    None       Charlann Lange, PA-C 12/10/17 0402    Varney Biles, MD 12/11/17 [phone]

## 2017-12-10 NOTE — Discharge Instructions (Signed)
Return tomorrow for DVT study of the left lower leg. Take Norco for pain as directed. Use caution when getting around while taking this medication as it can be drowsy causing. Also recommend Miralax once daily to avoid constipation. Follow up with Dr. Benay Spice at the [facility] for further evaluation of low platelet count as discussed.

## 2017-12-10 NOTE — ED Notes (Signed)
ED Provider at bedside. 

## 2017-12-14 ENCOUNTER — Telehealth: Payer: Self-pay | Admitting: Hematology and Oncology

## 2017-12-14 NOTE — Telephone Encounter (Signed)
Appointment moved due to scheduling error and patient notified per 4/26 sch msg

## 2017-12-18 ENCOUNTER — Telehealth: Payer: Self-pay | Admitting: Hematology and Oncology

## 2017-12-18 ENCOUNTER — Inpatient Hospital Stay: Payer: Medicare Other | Admitting: Oncology

## 2017-12-18 NOTE — Telephone Encounter (Signed)
Per 4/26 sch msg moved appointment from [facility] to [facility] and patient has been notified

## 2017-12-25 ENCOUNTER — Other Ambulatory Visit: Payer: Self-pay | Admitting: Hematology and Oncology

## 2017-12-25 ENCOUNTER — Telehealth: Payer: Self-pay | Admitting: Pharmacist

## 2017-12-25 MED ORDER — OSIMERTINIB MESYLATE 80 MG PO TABS: 80.0000 mg | ORAL_TABLET | Freq: Every day | ORAL | 0 refills | Status: DC

## 2017-12-25 NOTE — Telephone Encounter (Signed)
Oral Oncology Pharmacist Encounter  Received new prescription for Tagrisso (osimertinib). D/w MD. QTc 495 msec on 12/10/17 Still awaiting Dx. No Tagrisso to be prescribed at the moment.  No further needs from [facility] identified at this time. Oral Oncology Clinic will sign off. Please let us know if we can be of assistance in the future.  Johny Drilling, PharmD, BCPS, BCOP 12/25/2017 2:56 PM Oral Oncology Clinic [phone]

## 2017-12-26 ENCOUNTER — Encounter: Payer: Self-pay | Admitting: Hematology and Oncology

## 2017-12-26 ENCOUNTER — Telehealth: Payer: Self-pay

## 2017-12-26 ENCOUNTER — Inpatient Hospital Stay: Payer: Medicare Other | Attending: Hematology and Oncology | Admitting: Hematology and Oncology

## 2017-12-26 ENCOUNTER — Inpatient Hospital Stay: Payer: Medicare Other

## 2017-12-26 VITALS — BP 157/71 | HR 79 | Temp 97.7°F | Resp 18 | Ht 72.0 in | Wt 310.1 lb

## 2017-12-26 DIAGNOSIS — Z79899 Other long term (current) drug therapy: Secondary | ICD-10-CM | POA: Insufficient documentation

## 2017-12-26 DIAGNOSIS — D7589 Other specified diseases of blood and blood-forming organs: Secondary | ICD-10-CM

## 2017-12-26 DIAGNOSIS — G4733 Obstructive sleep apnea (adult) (pediatric): Secondary | ICD-10-CM | POA: Insufficient documentation

## 2017-12-26 DIAGNOSIS — I509 Heart failure, unspecified: Secondary | ICD-10-CM | POA: Insufficient documentation

## 2017-12-26 DIAGNOSIS — D696 Thrombocytopenia, unspecified: Secondary | ICD-10-CM

## 2017-12-26 DIAGNOSIS — F418 Other specified anxiety disorders: Secondary | ICD-10-CM | POA: Insufficient documentation

## 2017-12-26 DIAGNOSIS — I878 Other specified disorders of veins: Secondary | ICD-10-CM | POA: Insufficient documentation

## 2017-12-26 DIAGNOSIS — F17210 Nicotine dependence, cigarettes, uncomplicated: Secondary | ICD-10-CM | POA: Insufficient documentation

## 2017-12-26 DIAGNOSIS — J449 Chronic obstructive pulmonary disease, unspecified: Secondary | ICD-10-CM | POA: Diagnosis not present

## 2017-12-26 DIAGNOSIS — Z7984 Long term (current) use of oral hypoglycemic drugs: Secondary | ICD-10-CM | POA: Insufficient documentation

## 2017-12-26 DIAGNOSIS — R21 Rash and other nonspecific skin eruption: Secondary | ICD-10-CM | POA: Insufficient documentation

## 2017-12-26 DIAGNOSIS — I2510 Atherosclerotic heart disease of native coronary artery without angina pectoris: Secondary | ICD-10-CM | POA: Diagnosis not present

## 2017-12-26 DIAGNOSIS — R600 Localized edema: Secondary | ICD-10-CM | POA: Diagnosis not present

## 2017-12-26 DIAGNOSIS — E785 Hyperlipidemia, unspecified: Secondary | ICD-10-CM | POA: Diagnosis not present

## 2017-12-26 DIAGNOSIS — I252 Old myocardial infarction: Secondary | ICD-10-CM | POA: Insufficient documentation

## 2017-12-26 DIAGNOSIS — I110 Hypertensive heart disease with heart failure: Secondary | ICD-10-CM | POA: Insufficient documentation

## 2017-12-26 DIAGNOSIS — Z86718 Personal history of other venous thrombosis and embolism: Secondary | ICD-10-CM | POA: Insufficient documentation

## 2017-12-26 DIAGNOSIS — E1151 Type 2 diabetes mellitus with diabetic peripheral angiopathy without gangrene: Secondary | ICD-10-CM | POA: Diagnosis not present

## 2017-12-26 DIAGNOSIS — Z7982 Long term (current) use of aspirin: Secondary | ICD-10-CM | POA: Insufficient documentation

## 2017-12-26 DIAGNOSIS — M1990 Unspecified osteoarthritis, unspecified site: Secondary | ICD-10-CM | POA: Diagnosis not present

## 2017-12-26 LAB — LACTATE DEHYDROGENASE: LDH: 164 U/L (ref 125–245)

## 2017-12-26 LAB — CMP (CANCER CENTER ONLY)
ALT: 18 U/L (ref 0–55)
AST: 21 U/L (ref 5–34)
Albumin: 4.3 g/dL (ref 3.5–5.0)
Alkaline Phosphatase: 40 U/L (ref 40–150)
Anion gap: 9 (ref 3–11)
BILIRUBIN TOTAL: 0.5 mg/dL (ref 0.2–1.2)
BUN: 20 mg/dL (ref 7–26)
CO2: 29 mmol/L (ref 22–29)
Calcium: 10.3 mg/dL (ref 8.4–10.4)
Chloride: 103 mmol/L (ref 98–109)
Creatinine: 1.19 mg/dL (ref 0.70–1.30)
GFR, Est AFR Am: >60 mL/min (ref >60)
Glucose, Bld: 97 mg/dL (ref 70–140)
Potassium: 3.5 mmol/L (ref 3.5–5.1)
Sodium: 141 mmol/L (ref 136–145)
TOTAL PROTEIN: 7.1 g/dL (ref 6.4–8.3)

## 2017-12-26 LAB — CBC WITH DIFFERENTIAL (CANCER CENTER ONLY)
BASOS ABS: 0 10*3/uL (ref 0.0–0.1)
Basophils Relative: 0 %
EOS PCT: 2 %
Eosinophils Absolute: 0.1 10*3/uL (ref 0.0–0.5)
HCT: 44.1 % (ref 38.4–49.9)
Hemoglobin: 14.7 g/dL (ref 13.0–17.1)
LYMPHS ABS: 1 10*3/uL (ref 0.9–3.3)
LYMPHS PCT: 23 %
MCH: 34.3 pg — AB (ref 27.2–33.4)
MCHC: 33.4 g/dL (ref 32.0–36.0)
MCV: 102.7 fL — AB (ref 79.3–98.0)
MONO ABS: 0.3 10*3/uL (ref 0.1–0.9)
Monocytes Relative: 7 %
Neutro Abs: 3 10*3/uL (ref 1.5–6.5)
Neutrophils Relative %: 68 %
PLATELETS: 83 10*3/uL — AB (ref 140–400)
RBC: 4.3 MIL/uL (ref 4.20–5.82)
RDW: 13.4 % (ref 11.0–14.6)
WBC Count: 4.4 10*3/uL (ref 4.0–10.3)

## 2017-12-26 LAB — TECHNOLOGIST SMEAR REVIEW

## 2017-12-26 LAB — FOLATE: Folate: 7 ng/mL (ref >5.9)

## 2017-12-26 LAB — TSH: TSH: 2.007 u[IU]/mL (ref 0.320–4.118)

## 2017-12-26 NOTE — Telephone Encounter (Signed)
Printed avs and calender of upcoming appointment per orders 5/8 los

## 2017-12-27 LAB — ANCA TITERS: C-ANCA: <1:20 {titer}

## 2017-12-27 LAB — RHEUMATOID FACTOR: Rhuematoid fact SerPl-aCnc: <10 IU/mL (ref 0.0–13.9)

## 2017-12-27 LAB — C3 AND C4
C3 Complement: 157 mg/dL (ref 82–167)
Complement C4, Body Fluid: 23 mg/dL (ref 14–44)

## 2017-12-28 LAB — ANTINUCLEAR ANTIBODIES, IFA: ANA Ab, IFA: NEGATIVE

## 2017-12-28 LAB — METHYLMALONIC ACID, SERUM: Methylmalonic Acid, Quantitative: 398 nmol/L — ABNORMAL HIGH (ref 0–378)

## 2017-12-30 NOTE — Assessment & Plan Note (Signed)
66 y.o. Male with multiple comorbidities includingconstellation of pulmonary and cardiovascular diseases in the setting of diabetes mellitus.  Patient has chronic venous stasis in bilateral lower extremities exacerbated by congestive heart failure with associated chronic cutaneous changes.  Hematological profile demonstrates moderate thrombocytopenia and no significant abnormality in the white blood cell lineage.  Hemoglobin is normal level, but demonstrates significant macrocytosis and hypochromia.  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  Plan: - Labs today as outlined below. -Proceed with bone marrow biopsy by interventional radiology. -Return to our clinic 2 weeks after bone marrow biopsy to discuss the findings and management strategies. 

## 2017-12-30 NOTE — Progress Notes (Signed)
[facility] New Visit:  Assessment: Thrombocytopenia 67 y.o. Male with multiple comorbidities includingconstellation of pulmonary and cardiovascular diseases in the setting of diabetes mellitus.  Patient has chronic venous stasis in bilateral lower extremities exacerbated by congestive heart failure with associated chronic cutaneous changes.  Hematological profile demonstrates moderate thrombocytopenia and no significant abnormality in the white blood cell lineage.  Hemoglobin is normal level, but demonstrates significant macrocytosis and hypochromia.  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  Plan: - Labs today as outlined below. -Proceed with bone marrow biopsy by interventional radiology. -Return to our clinic 2 weeks after bone marrow biopsy to discuss the findings and management strategies.   Voice recognition software was used and creation of this note. Despite my best effort at editing the text, some misspelling/errors may have occurred. Orders Placed This Encounter  Procedures  . CT BONE MARROW BIOPSY & ASPIRATION    Standing Status:   Future    Standing Expiration Date:   03/29/2019    Order Specific Question:   Reason for Exam (SYMPTOM  OR DIAGNOSIS REQUIRED)    Answer:   Macrocytosis and thrombocytopenia -- please eval for MDS    Order Specific Question:   Preferred imaging location?    Answer:   [facility]    Order Specific Question:   Radiology Contrast Protocol - do NOT remove file path    Answer:   \\[email or web address]\[email or web address]\Radiant\CTProtocols.pdf  . CT BIOPSY    Order Specific Question:   Lab orders requested (DO NOT place separate lab orders, these will be automatically ordered during procedure specimen collection):    Answer:   Other    Comments:   Pathology, cytogenetics, MDS FISH, flow cytometry    Order Specific Question:   Reason for Exam (SYMPTOM  OR  DIAGNOSIS REQUIRED)    Answer:   Possible MDS    Order Specific Question:   Preferred imaging location?    Answer:   [facility]    Order Specific Question:   Radiology Contrast Protocol - do NOT remove file path    Answer:   \\[email or web address]\Radiant\CTProtocols.pdf  . CBC with Differential ([place] Only)    Standing Status:   Future    Number of Occurrences:   1    Standing Expiration Date:   12/27/2018  . CMP ([place] only)    Standing Status:   Future    Number of Occurrences:   1    Standing Expiration Date:   12/27/2018  . Lactate dehydrogenase (LDH)    Standing Status:   Future    Number of Occurrences:   1    Standing Expiration Date:   12/26/2018  . ANA, IFA (with reflex)    Standing Status:   Future    Number of Occurrences:   1    Standing Expiration Date:   12/26/2018  . ANCA [place]    Standing Status:   Future    Number of Occurrences:   1    Standing Expiration Date:   12/26/2018  . Rheumatoid factor    Standing Status:   Future    Number of Occurrences:   1    Standing Expiration Date:   12/26/2018  . C3 and C4    Standing Status:   Future    Number of Occurrences:   1    Standing Expiration Date:   12/27/2018  . Folate, Serum  Standing Status:   Future    Number of Occurrences:   1    Standing Expiration Date:   12/26/2018  . Methylmalonic acid, serum    Standing Status:   Future    Number of Occurrences:   1    Standing Expiration Date:   12/26/2018  . TSH    Standing Status:   Future    Number of Occurrences:   1    Standing Expiration Date:   12/27/2018  . Technologist smear review    Standing Status:   Future    Number of Occurrences:   1    Standing Expiration Date:   12/27/2018    All questions were answered.  . The patient knows to call the clinic with any problems, questions or concerns.  This note was electronically signed.    History of Presenting Illness Eric [name] 67 y.o. presenting to the [facility] for  thrombocytopenia evaluation, referred by Dr Biagio Borg.  Patient's past medical history is significant for anxiety, arthritis, congestive heart failure, history of vascular claudications, COPD, coronary artery disease including previous myocardial infarction, previous history of stroke, diabetes mellitus type 2, history of DVT, migraine headaches, morbid obesity, obstructive sleep apnea.  Patient is currently at baseline of his health.  His dominant complaint is persistent and uncomfortable swelling in bilateral lower extremities associated with chronic discoloration of the skin and subcutaneous blistering.  No bleeding, purulent drainage, or acute erythema.  No fevers, chills, night sweats.  Oncological/hematological History: --Labs, 12/09/17: WBC 4.8, Hgb 14.9, MCV 103.7, MCH 34.3, MCHC 33.0, RDW 13.5, Plt 68  Medical History: Past Medical History:  Diagnosis Date  . ALLERGIC RHINITIS 06/24/2007  . 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 ANXIETY 06/24/2007  . Arthritis    "hands, knees, hips, neck; q part of the body" (01/27/2015)  . CELLULITIS AND ABSCESS OF LEG EXCEPT FOOT 03/28/2010  . Cellulitis of left leg 01/27/2015  . CHF (congestive heart failure) ([place])   . Chronic lower back pain    "I have a steel rod in my back"  . Claudication ([place]) 06/02/2011  . COLONIC POLYPS, HX OF 06/24/2007  . Complication of anesthesia    "they can't put me asleep cause I don't wake up" (01/27/2015)  . CONSTIPATION, CHRONIC, HX OF 06/24/2007  . COPD (chronic obstructive pulmonary disease) ([place])   . Coronary artery disease   . CVA (cerebral vascular accident) [facility]) 1990's   "w/MI"; denies residual on 01/27/2015  . DEPRESSION 06/24/2007  . DIABETES MELLITUS, TYPE II dx'd 2015  . DIVERTICULOSIS, COLON 06/24/2007  . DVT (deep venous thrombosis) ([place])   . Heart murmur   . HYPERLIPIDEMIA 06/24/2007  . HYPERTENSION 06/24/2007  . INSOMNIA-SLEEP DISORDER-UNSPEC 01/24/2010  . Migraine    "comes over my right eye q now and then"  (01/27/2015)  . Morbid obesity ([place]) 12/21/2015  . Myocardial infarction ([place]) 1990's X 1   "w/stroke"  . OSA (obstructive sleep apnea)   . SKIN LESION 01/24/2010  . Unspecified Peripheral Vascular Disease 06/24/2007    Surgical History: Past Surgical History:  Procedure Laterality Date  . ANTERIOR CERVICAL DECOMP/DISCECTOMY FUSION  1985   "job related injury"  . BACK SURGERY    . KNEE ARTHROSCOPY Bilateral 1990  . LIGAMENT REPAIR Right 1999   "wrist; job related injury"  . LUMBAR LAMINECTOMY  1990's    Family History: Family History  Problem Relation Age of Onset  . COPD Father   . Heart disease Father   .  Cancer Mother        lung cancer  . Heart disease Maternal Grandmother   . Heart disease Maternal Grandfather   . Heart disease Brother   . Hypertension Brother   . Heart attack Brother   . Peripheral vascular disease Brother     Social History: Social History   Socioeconomic History  . Marital status: Married    Spouse name: Not on file  . Number of children: 3  . Years of education: Not on file  . Highest education level: Not on file  Occupational History  . 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 Financial resource strain: Not on file  . Food insecurity:    Worry: Not on file    Inability: Not on file  . Transportation needs:    Medical: Not on file    Non-medical: Not on file  Tobacco Use  . Smoking status: Current Every Day Smoker    Years: 48.00    Types: Cigars  . Smokeless tobacco: Never Used  . Tobacco comment: 01/27/2015 pt states he smokes 5 cigar cigarettes daily  Substance and Sexual Activity  . Alcohol use: Yes    Alcohol/week: 0.0 oz    Comment: 01/27/2015 "might have a couple beers/yr"  . Drug use: No  . Sexual activity: Never  Lifestyle  . Physical activity:    Days per week: Not on file    Minutes per session: Not on file  . Stress: Not on file  Relationships  . Social  connections:    Talks on phone: Not on file    Gets together: Not on file    Attends religious service: Not on file    Active member of club or organization: Not on file    Attends meetings of clubs or organizations: Not on file    Relationship status: Not on file  . Intimate partner violence:    Fear of current or ex partner: Not on file    Emotionally abused: Not on file    Physically abused: Not on file    Forced sexual activity: Not on file  Other Topics Concern  . Not on file  Social History Narrative  . Not on file    Allergies: Allergies  Allergen Reactions  . Oxycodone Other (See Comments)    Sedation/somnolence    Medications:  Current Outpatient Medications  Medication Sig Dispense Refill  . albuterol (PROVENTIL HFA;VENTOLIN HFA) 108 (90 BASE) MCG/ACT inhaler Inhale 2 puffs into the lungs every 6 (six) hours as needed for wheezing or shortness of breath. 1 Inhaler 11  . aspirin 81 MG tablet Take 81 mg by mouth daily.      . Blood Glucose Monitoring Suppl (ACCU-CHEK NANO SMARTVIEW) w/Device KIT use as directed to check insulin levels 1 kit 0  . Blood Glucose Monitoring Suppl DEVI 1 Device by Does not apply route daily. 1 each 0  . budesonide-formoterol (SYMBICORT) 160-4.5 MCG/ACT inhaler Inhale 2 puffs into the lungs 2 (two) times daily. 1 Inhaler 11  . clonazePAM (KLONOPIN) 0.5 MG tablet Take 1-2 TABLET BY MOUTH at bedtime, can take up to 2 depending on anxiety 60 tablet 5  . fenofibrate 160 MG tablet Take 1 tablet (160 mg total) by mouth daily. 90 tablet 3  . furosemide (LASIX) 20 MG tablet Take 0.5 tablets (10 mg total) by mouth daily. 30 tablet 5  . gabapentin (NEURONTIN) 100 MG capsule Take 2  capsules (200 mg total) by mouth at bedtime. 180 capsule 3  . glipiZIDE (GLUCOTROL XL) 2.5 MG 24 hr tablet Take 1 tablet (2.5 mg total) by mouth daily with breakfast. 30 tablet 5  . glucose blood test strip Use as instructed 100 each 12  . hydrochlorothiazide (HYDRODIURIL) 25  MG tablet Take 1 tablet (25 mg total) by mouth daily. 90 tablet 3  . HYDROcodone-acetaminophen (NORCO/VICODIN) 5-325 MG tablet Take 1-2 tablets by mouth every 4 (four) hours as needed. 12 tablet 0  . labetalol (NORMODYNE) 300 MG tablet Take 1 tablet (300 mg total) by mouth 2 (two) times daily. 60 tablet 5  . Lancets (ACCU-CHEK SOFT TOUCH) lancets Use as instructed 100 each 12  . metFORMIN (GLUCOPHAGE) 1000 MG tablet Take 1 tablet (1,000 mg total) by mouth 2 (two) times daily with a meal. 180 tablet 1  . simvastatin (ZOCOR) 80 MG tablet Take 1 tablet (80 mg total) by mouth at bedtime. 30 tablet 5  . gabapentin (NEURONTIN) 100 MG capsule Take 2 capsules (200 mg total) by mouth at bedtime. Yearly physical w/labs due in May must see MD for refills (Patient not taking: Reported on 12/10/2017) 60 capsule 0   No current facility-administered medications for this visit.     Review of Systems: Review of Systems  Cardiovascular: Positive for leg swelling.  Skin: Positive for rash.  All other systems reviewed and are negative.    PHYSICAL EXAMINATION Blood pressure (!) 157/71, pulse 79, temperature 97.7 F (36.5 C), temperature source Oral, resp. rate 18, height 6' (1.829 m), weight (!) 310 lb 1.6 oz (140.7 kg), SpO2 97 %.  ECOG PERFORMANCE STATUS: 2 - Symptomatic, <50% confined to bed  Physical Exam  Constitutional: He is oriented to person, place, and time. He appears well-developed and well-nourished. No distress.  HENT:  Head: Normocephalic and atraumatic.  Mouth/Throat: Oropharynx is clear and moist. No oropharyngeal exudate.  Eyes: Pupils are equal, round, and reactive to light. Conjunctivae and EOM are normal. No scleral icterus.  Neck: No thyromegaly present.  Cardiovascular: Normal rate, regular rhythm and normal heart sounds. Exam reveals no gallop and no friction rub.  No murmur heard. Pulmonary/Chest: Effort normal and breath sounds normal. No stridor. No respiratory distress. He  has no wheezes. He has no rales.  Abdominal: Soft. Bowel sounds are normal. He exhibits no distension and no mass. There is no tenderness. There is no guarding.  Musculoskeletal: He exhibits edema.  Bilateral 3+ pitting edema in the lower extremities with associated cutaneous discoloration and vesiculation of the skin.  Lymphadenopathy:    He has no cervical adenopathy.  Neurological: He is alert and oriented to person, place, and time. He displays normal reflexes. No cranial nerve deficit or sensory deficit.  Skin: No rash noted. He is not diaphoretic. There is erythema. No pallor.     LABORATORY DATA: I have personally reviewed the data as listed: Appointment on 12/26/2017  Component Date Value Ref Range Status  . Tech Review 12/26/2017 WBC AND RBC MORPHOLOGY NORMAL, PLTS DECREASED WITH OCC LARGE PLT SEEN   Final   Performed at [facility], [place] [address] [place], [place] [address]  . TSH 12/26/2017 2.007  0.320 - 4.118 uIU/mL Final   Performed at [facility], [place] [address] [place], [place] [address]  . Methylmalonic Acid, Quantitative 12/26/2017 398* 0 - 378 nmol/L Final  . Disclaimer: 12/26/2017 Comment   Final   Comment: (NOTE) This test was  developed and its performance characteristics determined by [facility]. It has not been cleared or approved by the Food and Drug Administration. Performed At: [facility] [place], [place] [id number] Rush Farmer MD [id number] Performed at [facility], [place] [address], [place], [place] [address]   . Folate 12/26/2017 7.0  >5.9 ng/mL Final   Performed at [place] [address], [place], [place] [address]  . C3 Complement 12/26/2017 157  82 - 167 mg/dL Final  . Complement C4, Body Fluid 12/26/2017 23  14 - 44 mg/dL Final   Comment: (NOTE) Performed At: [facility] [place], [place] [id number] Rush Farmer MD [id number] Performed at [facility], [place] [address] [place], [place] [address]   . Rhuematoid fact SerPl-aCnc 12/26/2017 <10.0  0.0 - 13.9 IU/mL Final   Comment: (NOTE) Performed At: [facility] [place], [place] [id number] Rush Farmer MD [id number] Performed at [facility], [place] [address], [place], [place] [address]   . C-ANCA 12/26/2017 <1:20  Neg:<1:20 titer Final  . P-ANCA 12/26/2017 <1:20  Neg:<1:20 titer Final   Comment: (NOTE) The presence of positive fluorescence exhibiting P-ANCA or C-ANCA patterns alone is not specific for the diagnosis of Wegener's Granulomatosis (WG) or microscopic polyangiitis. Decisions about treatment should not be based solely on ANCA IFA results.  The International ANCA Group Consensus recommends follow up testing of positive sera with both PR-3 and MPO-ANCA enzyme immunoassays. As many as 5% serum samples are positive only by EIA. Ref. AM J Clin Pathol 1999;111:507-513.   Marland Kitchen Atypical P-ANCA titer 12/26/2017 <1:20  Neg:<1:20 titer Final   Comment: (NOTE) The atypical pANCA pattern has been observed in a significant percentage of patients with ulcerative colitis, primary sclerosing cholangitis and autoimmune hepatitis. Performed At: [facility] [place], [place] [id number] Rush Farmer MD [id number] Performed at [facility], [place] [address], [place], [place] [address]   . ANA Ab, IFA 12/26/2017 Negative   Final   Comment: (NOTE)                                     Negative   <1:80                                     Borderline  1:80                                     Positive   >1:80 Performed At: [facility] [place], [place] [id number] Rush Farmer MD [id number] Performed at [facility], [place] [address], [place], [place] [address]   . LDH  12/26/2017 164  125 - 245 U/L Final   Performed at [facility], [place] [address] [place], [place] [address]  . Sodium 12/26/2017 141  136 - 145 mmol/L Final  . Potassium 12/26/2017 3.5  3.5 - 5.1 mmol/L Final  . Chloride 12/26/2017 103  98 - 109 mmol/L Final  . CO2 12/26/2017 29  22 - 29 mmol/L Final  . Glucose, Bld 12/26/2017 97  70 - 140 mg/dL Final  . BUN 12/26/2017 20  7 -  26 mg/dL Final  . Creatinine 12/26/2017 1.19  0.70 - 1.30 mg/dL Final  . Calcium 12/26/2017 10.3  8.4 - 10.4 mg/dL Final  . Total Protein 12/26/2017 7.1  6.4 - 8.3 g/dL Final  . Albumin 12/26/2017 4.3  3.5 - 5.0 g/dL Final  . AST 12/26/2017 21  5 - 34 U/L Final  . ALT 12/26/2017 18  0 - 55 U/L Final  . Alkaline Phosphatase 12/26/2017 40  40 - 150 U/L Final  . Total Bilirubin 12/26/2017 0.5  0.2 - 1.2 mg/dL Final  . GFR, Est Non Af Am 12/26/2017 >60  >60 mL/min Final  . GFR, Est AFR Am 12/26/2017 >60  >60 mL/min Final   Comment: (NOTE) The eGFR has been calculated using the CKD EPI equation. This calculation has not been validated in all clinical situations. eGFR's persistently <60 mL/min signify possible Chronic Kidney Disease.   Georgiann Hahn gap 12/26/2017 9  3 - 11 Final   Performed at [facility], [place] [address] [place], [place] [address]  . WBC Count 12/26/2017 4.4  4.0 - 10.3 K/uL Final  . RBC 12/26/2017 4.30  4.20 - 5.82 MIL/uL Final  . Hemoglobin 12/26/2017 14.7  13.0 - 17.1 g/dL Final  . HCT 12/26/2017 44.1  38.4 - 49.9 % Final  . MCV 12/26/2017 102.7* 79.3 - 98.0 fL Final  . MCH 12/26/2017 34.3* 27.2 - 33.4 pg Final  . MCHC 12/26/2017 33.4  32.0 - 36.0 g/dL Final  . RDW 12/26/2017 13.4  11.0 - 14.6 % Final  . Platelet Count 12/26/2017 83* 140 - 400 K/uL Final  . Neutrophils Relative % 12/26/2017 68  % Final  . Neutro Abs 12/26/2017 3.0  1.5 - 6.5 K/uL Final  . Lymphocytes Relative 12/26/2017 23  % Final  . Lymphs Abs 12/26/2017 1.0  0.9 - 3.3 K/uL  Final  . Monocytes Relative 12/26/2017 7  % Final  . Monocytes Absolute 12/26/2017 0.3  0.1 - 0.9 K/uL Final  . Eosinophils Relative 12/26/2017 2  % Final  . Eosinophils Absolute 12/26/2017 0.1  0.0 - 0.5 K/uL Final  . Basophils Relative 12/26/2017 0  % Final  . Basophils Absolute 12/26/2017 0.0  0.0 - 0.1 K/uL Final   Performed at [facility], [place] [address] [place], [place] [address]         Ardath Sax, MD

## 2018-01-02 ENCOUNTER — Ambulatory Visit: Payer: Self-pay | Admitting: *Deleted

## 2018-01-02 ENCOUNTER — Other Ambulatory Visit: Payer: Self-pay | Admitting: Student

## 2018-01-02 NOTE — Telephone Encounter (Signed)
Pt reports blisters and redness and swelling of both feet.Patient is  A diabetic and has  A  History of extremity swelling and blisters in past,Patient states the blisters rupture at times and fluid drains out.Patient reports a pain scale of 6. Appointment made with Dr Jenny Reichmann for Friday. Patient advised to try to keep legs elevated as much as possible Advised to call back if symptoms become worse  Reason for Disposition . [1] Cause unknown AND [2] no new blisters  Answer Assessment - Initial Assessment Questions 1. APPEARANCE of BLISTER: "What does it look like?"      Multiple  Blisters both feet about the size of an eraser fluid filled   2. SIZE: "How large is the blister?" (inches, cm or compare to coins)     Pencil eraser size 3. LOCATION: "Where are the blisters located?"        Start on ankle  And  Extend 6  Inches  Upward  Both legs   4. WHEN: "When did the blister happen?"         Months  Ago   5. CAUSE: "What do you think caused the blister?"        No  Sure    6. PAIN: "Does it hurt?" If so, ask: "How bad is the pain?"  (Scale 1-10; or mild, moderate, severe)          6   7. OTHER SYMPTOMS: "Do you have any other symptoms?" (e.g., fever)         Both legs are red   Swells   At times  When legs  Are  Dependent           Pt is a  Diabetic and takes fluid meds  Pt  Reports  Sometimes may skip a  Dose  Protocols used: BLISTER - FOOT AND HAND-A-AH

## 2018-01-03 ENCOUNTER — Ambulatory Visit (HOSPITAL_COMMUNITY)
Admission: RE | Admit: 2018-01-03 | Discharge: 2018-01-03 | Disposition: A | Discharge status: Home / Self Care | Payer: Medicare Other | Source: Ambulatory Visit | Attending: Hematology and Oncology | Admitting: Hematology and Oncology

## 2018-01-03 ENCOUNTER — Encounter (HOSPITAL_COMMUNITY): Payer: Self-pay

## 2018-01-03 DIAGNOSIS — Z7984 Long term (current) use of oral hypoglycemic drugs: Secondary | ICD-10-CM | POA: Insufficient documentation

## 2018-01-03 DIAGNOSIS — G4733 Obstructive sleep apnea (adult) (pediatric): Secondary | ICD-10-CM | POA: Insufficient documentation

## 2018-01-03 DIAGNOSIS — I509 Heart failure, unspecified: Secondary | ICD-10-CM | POA: Insufficient documentation

## 2018-01-03 DIAGNOSIS — Z79899 Other long term (current) drug therapy: Secondary | ICD-10-CM | POA: Insufficient documentation

## 2018-01-03 DIAGNOSIS — Z6841 Body Mass Index (BMI) 40.0 and over, adult: Secondary | ICD-10-CM | POA: Diagnosis not present

## 2018-01-03 DIAGNOSIS — F17210 Nicotine dependence, cigarettes, uncomplicated: Secondary | ICD-10-CM | POA: Insufficient documentation

## 2018-01-03 DIAGNOSIS — D7589 Other specified diseases of blood and blood-forming organs: Secondary | ICD-10-CM | POA: Diagnosis not present

## 2018-01-03 DIAGNOSIS — F419 Anxiety disorder, unspecified: Secondary | ICD-10-CM | POA: Diagnosis not present

## 2018-01-03 DIAGNOSIS — I252 Old myocardial infarction: Secondary | ICD-10-CM | POA: Diagnosis not present

## 2018-01-03 DIAGNOSIS — J449 Chronic obstructive pulmonary disease, unspecified: Secondary | ICD-10-CM | POA: Insufficient documentation

## 2018-01-03 DIAGNOSIS — Z7982 Long term (current) use of aspirin: Secondary | ICD-10-CM | POA: Diagnosis not present

## 2018-01-03 DIAGNOSIS — I110 Hypertensive heart disease with heart failure: Secondary | ICD-10-CM | POA: Diagnosis not present

## 2018-01-03 DIAGNOSIS — Z885 Allergy status to narcotic agent status: Secondary | ICD-10-CM | POA: Insufficient documentation

## 2018-01-03 DIAGNOSIS — D696 Thrombocytopenia, unspecified: Secondary | ICD-10-CM | POA: Insufficient documentation

## 2018-01-03 DIAGNOSIS — E785 Hyperlipidemia, unspecified: Secondary | ICD-10-CM | POA: Insufficient documentation

## 2018-01-03 DIAGNOSIS — E1151 Type 2 diabetes mellitus with diabetic peripheral angiopathy without gangrene: Secondary | ICD-10-CM | POA: Insufficient documentation

## 2018-01-03 DIAGNOSIS — I2510 Atherosclerotic heart disease of native coronary artery without angina pectoris: Secondary | ICD-10-CM | POA: Insufficient documentation

## 2018-01-03 LAB — CBC
HEMATOCRIT: 44.2 % (ref 39.0–52.0)
Hemoglobin: 15 g/dL (ref 13.0–17.0)
MCH: 35.5 pg — ABNORMAL HIGH (ref 26.0–34.0)
MCHC: 33.9 g/dL (ref 30.0–36.0)
MCV: 104.7 fL — AB (ref 78.0–100.0)
PLATELETS: 84 10*3/uL — AB (ref 150–400)
RBC: 4.22 MIL/uL (ref 4.22–5.81)
RDW: 13.6 % (ref 11.5–15.5)
WBC: 5.1 10*3/uL (ref 4.0–10.5)

## 2018-01-03 LAB — PROTIME-INR
INR: 1.02
Prothrombin Time: 13.3 seconds (ref 11.4–15.2)

## 2018-01-03 LAB — GLUCOSE, CAPILLARY: GLUCOSE-CAPILLARY: 92 mg/dL (ref 65–99)

## 2018-01-03 LAB — APTT: APTT: 32 s (ref 24–36)

## 2018-01-03 MED ORDER — SODIUM CHLORIDE 0.9 % IV SOLN
INTRAVENOUS | Status: DC
  Administered 2018-01-03: 08:00:00 via INTRAVENOUS

## 2018-01-03 MED ORDER — MIDAZOLAM HCL 2 MG/2ML IJ SOLN
INTRAMUSCULAR | Status: AC | PRN
  Administered 2018-01-03: 1 mg via INTRAVENOUS

## 2018-01-03 MED ORDER — FENTANYL CITRATE (PF) 100 MCG/2ML IJ SOLN
INTRAMUSCULAR | Status: AC
  Filled 2018-01-03: qty 4

## 2018-01-03 MED ORDER — MIDAZOLAM HCL 2 MG/2ML IJ SOLN
INTRAMUSCULAR | Status: AC
  Filled 2018-01-03: qty 4

## 2018-01-03 MED ORDER — FENTANYL CITRATE (PF) 100 MCG/2ML IJ SOLN
INTRAMUSCULAR | Status: AC | PRN
  Administered 2018-01-03 (×2): 50 ug via INTRAVENOUS

## 2018-01-03 NOTE — Procedures (Signed)
Interventional Radiology Procedure Note  Procedure: CT guided aspirate and core biopsy of right posterior iliac bone Complications: None Recommendations: - Bedrest supine x 1 hrs - OTC's PRN  Pain - Follow biopsy results  Signed,  Jatavian Calica S. Levita Monical, DO    

## 2018-01-03 NOTE — Discharge Instructions (Signed)

## 2018-01-03 NOTE — Consult Note (Signed)
Chief Complaint: Patient was seen in consultation today for CT guided bone marrow biopsy  Referring Physician(s): Hickman G  Supervising Physician: Corrie Mckusick  Patient Status: [facility] - Out-pt  History of Present Illness: Eric [name] is a 67 y.o. male with history of macrocytosis and thrombocytopenia of unknown etiology who presents today for CT-guided bone marrow biopsy for further evaluation.   Past Medical History:  Diagnosis Date  . ALLERGIC RHINITIS 06/24/2007  . 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 ANXIETY 06/24/2007  . Arthritis    "hands, knees, hips, neck; q part of the body" (01/27/2015)  . CELLULITIS AND ABSCESS OF LEG EXCEPT FOOT 03/28/2010  . Cellulitis of left leg 01/27/2015  . CHF (congestive heart failure) ([place])   . Chronic lower back pain    "I have a steel rod in my back"  . Claudication ([place]) 06/02/2011  . COLONIC POLYPS, HX OF 06/24/2007  . Complication of anesthesia    "they can't put me asleep cause I don't wake up" (01/27/2015)  . CONSTIPATION, CHRONIC, HX OF 06/24/2007  . COPD (chronic obstructive pulmonary disease) ([place])   . Coronary artery disease   . CVA (cerebral vascular accident) [facility]) 1990's   "w/MI"; denies residual on 01/27/2015  . DEPRESSION 06/24/2007  . DIABETES MELLITUS, TYPE II dx'd 2015  . DIVERTICULOSIS, COLON 06/24/2007  . DVT (deep venous thrombosis) ([place])   . Heart murmur   . HYPERLIPIDEMIA 06/24/2007  . HYPERTENSION 06/24/2007  . INSOMNIA-SLEEP DISORDER-UNSPEC 01/24/2010  . Migraine    "comes over my right eye q now and then" (01/27/2015)  . Morbid obesity ([place]) 12/21/2015  . Myocardial infarction ([place]) 1990's X 1   "w/stroke"  . OSA (obstructive sleep apnea)   . SKIN LESION 01/24/2010  . Unspecified Peripheral Vascular Disease 06/24/2007    Past Surgical History:  Procedure Laterality Date  . ANTERIOR CERVICAL DECOMP/DISCECTOMY FUSION  1985   "job related injury"  . BACK SURGERY    . KNEE ARTHROSCOPY Bilateral 1990  . LIGAMENT REPAIR  Right 1999   "wrist; job related injury"  . LUMBAR LAMINECTOMY  1990's    Allergies: Oxycodone  Medications: Prior to Admission medications   Medication Sig Start Date End Date Taking? Authorizing Provider  aspirin 81 MG tablet Take 81 mg by mouth daily.     Yes [provider]  Blood Glucose Monitoring Suppl (ACCU-CHEK NANO SMARTVIEW) w/Device KIT use as directed to check insulin levels 03/27/17  Yes Biagio Borg, MD  Blood Glucose Monitoring Suppl DEVI 1 Device by Does not apply route daily. 02/10/15  Yes Biagio Borg, MD  clonazePAM (KLONOPIN) 0.5 MG tablet Take 1-2 TABLET BY MOUTH at bedtime, can take up to 2 depending on anxiety 10/22/17  Yes Biagio Borg, MD  fenofibrate 160 MG tablet Take 1 tablet (160 mg total) by mouth daily. 02/16/17  Yes Biagio Borg, MD  furosemide (LASIX) 20 MG tablet Take 0.5 tablets (10 mg total) by mouth daily. 09/12/17  Yes Biagio Borg, MD  gabapentin (NEURONTIN) 100 MG capsule Take 2 capsules (200 mg total) by mouth at bedtime. Yearly physical w/labs due in May must see MD for refills 12/18/16  Yes Biagio Borg, MD  gabapentin (NEURONTIN) 100 MG capsule Take 2 capsules (200 mg total) by mouth at bedtime. 02/16/17  Yes Biagio Borg, MD  glipiZIDE (GLUCOTROL XL) 2.5 MG 24 hr tablet Take 1 tablet (2.5 mg total) by mouth daily with breakfast. 11/16/17  Yes Cathlean Cower  W, MD  glucose blood test strip Use as instructed 02/10/15  Yes Biagio Borg, MD  hydrochlorothiazide (HYDRODIURIL) 25 MG tablet Take 1 tablet (25 mg total) by mouth daily. 08/31/17  Yes Biagio Borg, MD  labetalol (NORMODYNE) 300 MG tablet Take 1 tablet (300 mg total) by mouth 2 (two) times daily. 06/26/17  Yes Biagio Borg, MD  Lancets (ACCU-CHEK SOFT TOUCH) lancets Use as instructed 01/25/17  Yes Biagio Borg, MD  metFORMIN (GLUCOPHAGE) 1000 MG tablet Take 1 tablet (1,000 mg total) by mouth 2 (two) times daily with a meal. 06/26/17  Yes Biagio Borg, MD  simvastatin (ZOCOR) 80 MG tablet  Take 1 tablet (80 mg total) by mouth at bedtime. 07/25/17  Yes Biagio Borg, MD  albuterol (PROVENTIL HFA;VENTOLIN HFA) 108 (90 BASE) MCG/ACT inhaler Inhale 2 puffs into the lungs every 6 (six) hours as needed for wheezing or shortness of breath. 06/22/15   Biagio Borg, MD  budesonide-formoterol [facility]) 160-4.5 MCG/ACT inhaler Inhale 2 puffs into the lungs 2 (two) times daily. 12/21/15   Biagio Borg, MD  HYDROcodone-acetaminophen (NORCO/VICODIN) 5-325 MG tablet Take 1-2 tablets by mouth every 4 (four) hours as needed. 12/10/17   Charlann Lange, PA-C     Family History  Problem Relation Age of Onset  . COPD Father   . Heart disease Father   . Cancer Mother        lung cancer  . Heart disease Maternal Grandmother   . Heart disease Maternal Grandfather   . Heart disease Brother   . Hypertension Brother   . Heart attack Brother   . Peripheral vascular disease Brother     Social History   Socioeconomic History  . Marital status: Married    Spouse name: Not on file  . Number of children: 3  . Years of education: Not on file  . Highest education level: Not on file  Occupational History  . 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 Financial resource strain: Not on file  . Food insecurity:    Worry: Not on file    Inability: Not on file  . Transportation needs:    Medical: Not on file    Non-medical: Not on file  Tobacco Use  . Smoking status: Current Every Day Smoker    Years: 48.00    Types: Cigars  . Smokeless tobacco: Never Used  . Tobacco comment: 01/27/2015 pt states he smokes 5 cigar cigarettes daily  Substance and Sexual Activity  . Alcohol use: Yes    Alcohol/week: 0.0 oz    Comment: 01/27/2015 "might have a couple beers/yr"  . Drug use: No  . Sexual activity: Never  Lifestyle  . Physical activity:    Days per week: Not on file    Minutes per session: Not on file  . Stress: Not on file  Relationships  .  Social connections:    Talks on phone: Not on file    Gets together: Not on file    Attends religious service: Not on file    Active member of club or organization: Not on file    Attends meetings of clubs or organizations: Not on file    Relationship status: Not on file  Other Topics Concern  . Not on file  Social History Narrative  . Not on file    Review of Systems denies fever, headache, chest pain/back pain, nausea, vomiting or  bleeding.  He does have some dyspnea with exertion, occasional cough, some draining lower extremity wounds/blisters  Vital Signs: Ht 6' (1.829 m)   Wt (!) 310 lb (140.6 kg)   BMI 42.04 kg/m   Physical Exam awake, alert.  Chest with distant breath sounds bilaterally.  Heart with regular rate and rhythm.  Abdomen obese, soft, positive bowel sounds, nontender.  Bilateral lower extremity edema with draining blisters of lower extremities  Imaging: Dg Chest 2 View  Result Date: 12/09/2017 CLINICAL DATA:  Patient presents to ED after having multiple falls in the past 4 days. EXAM: CHEST - 2 VIEW COMPARISON:  08/31/2016 FINDINGS: Heart size is accentuated by technique and probably normal. There is persistent opacity in the RIGHT LOWER lobe, showing slight improvement compared with multiple prior studies and favored to be chronic. No new consolidations or pleural effusions. No pulmonary edema. No pneumothorax or acute displaced fracture. IMPRESSION: Stable appearance of the chest.  No evidence for acute  abnormality. Electronically Signed   By: Nolon Nations M.D.   On: 12/09/2017 18:39   Dg Knee Complete 4 Views Left  Result Date: 12/09/2017 CLINICAL DATA:  Patient presents to ED after having multiple falls in the past 4 days. EXAM: LEFT KNEE - COMPLETE 4+ VIEW COMPARISON:  08/31/2017 FINDINGS: There is significant narrowing of the LATERAL and patellofemoral compartments. No acute fracture or joint effusion. There is atherosclerotic calcification of the popliteal  artery. IMPRESSION: Degenerative changes. No evidence for acute  abnormality. Electronically Signed   By: Nolon Nations M.D.   On: 12/09/2017 18:41   Dg Hip Unilat With Pelvis 2-3 Views Left  Result Date: 12/09/2017 CLINICAL DATA:  Patient presents to ED after having multiple falls in the past 4 days. EXAM: DG HIP (WITH OR WITHOUT PELVIS) 2-3V LEFT COMPARISON:  None. FINDINGS: There is no evidence of hip fracture or dislocation. There is no evidence of arthropathy or other focal bone abnormality. Remote lumbar fusion. IMPRESSION: Negative. Electronically Signed   By: Nolon Nations M.D.   On: 12/09/2017 18:37    Labs:  CBC: Recent Labs    08/31/17 0955 12/09/17 1750 12/26/17 1209 01/03/18 0749  WBC 4.3 4.8 4.4 5.1  HGB 14.9 14.9 14.7 15.0  HCT 43.8 45.1 44.1 44.2  PLT 83.0* 68* 83* 84*    COAGS: Recent Labs    01/03/18 0749  INR 1.02  APTT 32    BMP: Recent Labs    01/25/17 1026 08/31/17 0955 12/09/17 1750 12/26/17 1209  NA 138 136 137 141  K 3.8 3.7 3.2* 3.5  CL 101 99 103 103  CO2 _0 GLUCOSE 91 87 222* 97  BUN _1 CALCIUM 10.1 9.3 9.1 10.3  CREATININE 1.06 1.28 1.27* 1.19  GFRNONAA  --   --  57* >60  GFRAA  --   --  >60 >60    LIVER FUNCTION TESTS: Recent Labs    01/25/17 1026 08/31/17 0955 12/26/17 1209  BILITOT 0.6 0.5 0.5  AST _2 ALT _3 ALKPHOS 32* 39 40  PROT 6.8 6.8 7.1  ALBUMIN 4.6 4.5 4.3    TUMOR MARKERS: No results for input(s): AFPTM, CEA, CA199, CHROMGRNA in the last 8760 hours.  Assessment and Plan: 67 y.o. male with history of macrocytosis and thrombocytopenia of unknown etiology who presents today for CT-guided bone marrow biopsy for further evaluation.Risks and benefits discussed with the patient/spouse including, but not limited to  bleeding, infection, damage to adjacent structures or low yield requiring additional tests.  All of the patient's questions were answered, patient is agreeable to  proceed. Consent signed and in chart.     Thank you for this interesting consult.  I greatly enjoyed meeting BRAYEN BUNN and look forward to participating in their care.  A copy of this report was sent to the requesting provider on this date.  Electronically Signed: D. Rowe Robert, PA-C 01/03/2018, 8:55 AM   I spent a total of  20 minutes   in face to face in clinical consultation, greater than 50% of which was counseling/coordinating care for CT-guided bone marrow biopsy

## 2018-01-04 ENCOUNTER — Ambulatory Visit (INDEPENDENT_AMBULATORY_CARE_PROVIDER_SITE_OTHER): Payer: Medicare Other | Admitting: Internal Medicine

## 2018-01-04 ENCOUNTER — Encounter: Payer: Self-pay | Admitting: Internal Medicine

## 2018-01-04 VITALS — BP 142/86 | HR 82 | Temp 97.6°F | Ht 72.0 in | Wt 308.0 lb

## 2018-01-04 DIAGNOSIS — S81809A Unspecified open wound, unspecified lower leg, initial encounter: Secondary | ICD-10-CM | POA: Diagnosis not present

## 2018-01-04 DIAGNOSIS — D696 Thrombocytopenia, unspecified: Secondary | ICD-10-CM

## 2018-01-04 DIAGNOSIS — E1140 Type 2 diabetes mellitus with diabetic neuropathy, unspecified: Secondary | ICD-10-CM | POA: Diagnosis not present

## 2018-01-04 DIAGNOSIS — R269 Unspecified abnormalities of gait and mobility: Secondary | ICD-10-CM | POA: Diagnosis not present

## 2018-01-04 DIAGNOSIS — S81802A Unspecified open wound, left lower leg, initial encounter: Secondary | ICD-10-CM | POA: Insufficient documentation

## 2018-01-04 DIAGNOSIS — Z0001 Encounter for general adult medical examination with abnormal findings: Secondary | ICD-10-CM

## 2018-01-04 LAB — POCT GLYCOSYLATED HEMOGLOBIN (HGB A1C): HEMOGLOBIN A1C: 6

## 2018-01-04 NOTE — Progress Notes (Signed)
Subjective:    Patient ID: Eric [name], male    DOB: November 18, 1950, 67 y.o.   MRN: [id number]  HPI  Here for wellness and f/u;  Overall doing ok;  Pt denies Chest pain, worsening SOB, DOE, wheezing, orthopnea, PND, worsening LE edema, palpitations, dizziness or syncope.  Pt denies neurological change such as new headache, facial or extremity weakness.   Pt denies worsening depressive symptoms, suicidal ideation or panic. No fever, night sweats, wt loss, loss of appetite, or other constitutional symptoms.  Pt states good ability with ADL's, has low fall risk, home safety reviewed and adequate, no other significant changes in hearing or vision, and only occasionally active with exercise.  Declines pneumovax or labs today  Also pt denies polydipsia, polyuria, but has had several AM low sugar symptoms such as weakness or confusion improved with po intake.  Pt states overall fair to good compliance with meds, trying to follow lower cholesterol, diabetic diet, wt overall stable but no exercise however.   Wt Readings from Last 3 Encounters:  01/04/18 (!) 308 lb (139.7 kg)  01/03/18 (!) 310 lb (140.6 kg)  01/03/18 (!) 310 lb (140.6 kg)  Also has worsening gait, more unsteady and weak, wife is concerned as he is quite slow and weaker now, but no falls but refuses PT or cane, quite obstinant Also has worsening bilat post ankle itching with some scratching and blistering now several unroofed without red or drainage, though is some tender; no overt bleeding or bruising No other interval hx or new complaint Past Medical History:  Diagnosis Date  . ALLERGIC RHINITIS 06/24/2007  . 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 ANXIETY 06/24/2007  . Arthritis    "hands, knees, hips, neck; q part of the body" (01/27/2015)  . CELLULITIS AND ABSCESS OF LEG EXCEPT FOOT 03/28/2010  . Cellulitis of left leg 01/27/2015  . CHF (congestive heart failure) ([place])   . Chronic lower back pain    "I have a steel rod in my back"  . Claudication ([place])  06/02/2011  . COLONIC POLYPS, HX OF 06/24/2007  . Complication of anesthesia    "they can't put me asleep cause I don't wake up" (01/27/2015)  . CONSTIPATION, CHRONIC, HX OF 06/24/2007  . COPD (chronic obstructive pulmonary disease) ([place])   . Coronary artery disease   . CVA (cerebral vascular accident) [facility]) 1990's   "w/MI"; denies residual on 01/27/2015  . DEPRESSION 06/24/2007  . DIABETES MELLITUS, TYPE II dx'd 2015  . DIVERTICULOSIS, COLON 06/24/2007  . DVT (deep venous thrombosis) ([place])   . Heart murmur   . HYPERLIPIDEMIA 06/24/2007  . HYPERTENSION 06/24/2007  . INSOMNIA-SLEEP DISORDER-UNSPEC 01/24/2010  . Migraine    "comes over my right eye q now and then" (01/27/2015)  . Morbid obesity ([place]) 12/21/2015  . Myocardial infarction ([place]) 1990's X 1   "w/stroke"  . OSA (obstructive sleep apnea)   . SKIN LESION 01/24/2010  . Unspecified Peripheral Vascular Disease 06/24/2007   Past Surgical History:  Procedure Laterality Date  . ANTERIOR CERVICAL DECOMP/DISCECTOMY FUSION  1985   "job related injury"  . BACK SURGERY    . KNEE ARTHROSCOPY Bilateral 1990  . LIGAMENT REPAIR Right 1999   "wrist; job related injury"  . LUMBAR LAMINECTOMY  1990's    reports that he has been smoking cigars.  He has smoked for the past 48.00 years. He has never used smokeless tobacco. He reports that he drinks alcohol. He reports that he does not use drugs. family  history includes COPD in his father; Cancer in his mother; Heart attack in his brother; Heart disease in his brother, father, maternal grandfather, and maternal grandmother; Hypertension in his brother; Peripheral vascular disease in his brother. Allergies  Allergen Reactions  . Oxycodone Other (See Comments)    Sedation/somnolence   Current Outpatient Medications on File Prior to Visit  Medication Sig Dispense Refill  . aspirin 81 MG tablet Take 81 mg by mouth daily.      . Blood Glucose Monitoring Suppl (ACCU-CHEK NANO SMARTVIEW) w/Device KIT use as  directed to check insulin levels 1 kit 0  . Blood Glucose Monitoring Suppl DEVI 1 Device by Does not apply route daily. 1 each 0  . clonazePAM (KLONOPIN) 0.5 MG tablet Take 1-2 TABLET BY MOUTH at bedtime, can take up to 2 depending on anxiety 60 tablet 5  . fenofibrate 160 MG tablet Take 1 tablet (160 mg total) by mouth daily. 90 tablet 3  . furosemide (LASIX) 20 MG tablet Take 0.5 tablets (10 mg total) by mouth daily. 30 tablet 5  . gabapentin (NEURONTIN) 100 MG capsule Take 2 capsules (200 mg total) by mouth at bedtime. 180 capsule 3  . glucose blood test strip Use as instructed 100 each 12  . hydrochlorothiazide (HYDRODIURIL) 25 MG tablet Take 1 tablet (25 mg total) by mouth daily. 90 tablet 3  . labetalol (NORMODYNE) 300 MG tablet Take 1 tablet (300 mg total) by mouth 2 (two) times daily. 60 tablet 5  . Lancets (ACCU-CHEK SOFT TOUCH) lancets Use as instructed 100 each 12  . metFORMIN (GLUCOPHAGE) 1000 MG tablet Take 1 tablet (1,000 mg total) by mouth 2 (two) times daily with a meal. 180 tablet 1  . simvastatin (ZOCOR) 80 MG tablet Take 1 tablet (80 mg total) by mouth at bedtime. 30 tablet 5   No current facility-administered medications on file prior to visit.    Review of Systems Constitutional: Negative for other unusual diaphoresis, sweats, appetite or weight changes HENT: Negative for other worsening hearing loss, ear pain, facial swelling, mouth sores or neck stiffness.   Eyes: Negative for other worsening pain, redness or other visual disturbance.  Respiratory: Negative for other stridor or swelling Cardiovascular: Negative for other palpitations or other chest pain  Gastrointestinal: Negative for worsening diarrhea or loose stools, blood in stool, distention or other pain Genitourinary: Negative for hematuria, flank pain or other change in urine volume.  Musculoskeletal: Negative for myalgias or other joint swelling.  Skin: Negative for other color change, or other wound or  worsening drainage.  Neurological: Negative for other syncope or numbness. Hematological: Negative for other adenopathy or swelling Psychiatric/Behavioral: Negative for hallucinations, other worsening agitation, SI, self-injury, or new decreased concentration All other system neg per pt    Objective:   Physical Exam BP (!) 142/86   Pulse 82   Temp 97.6 F (36.4 C) (Oral)   Ht 6' (1.829 m)   Wt (!) 308 lb (139.7 kg)   SpO2 93%   BMI 41.77 kg/m  VS noted, non toxic Constitutional: Pt appears in NAD HENT: Head: NCAT.  Right Ear: External ear normal.  Left Ear: External ear normal.  Eyes: . Pupils are equal, round, and reactive to light. Conjunctivae and EOM are normal Nose: without d/c or deformity Neck: Neck supple. Gross normal ROM Cardiovascular: Normal rate and regular rhythm.   Pulmonary/Chest: Effort normal and breath sounds without rales or wheezing.  Abd:  Soft, NT, ND, + BS, no organomegaly Neurological:  Pt is alert. At baseline orientation, motor grossly intact, but slow to stand and mild unsteady to ambulate, weaker overall  Skin: Skin is warm. No rashes, but has new lesions with numerous open blistering to post ankles/distal legs, no LE edema, no petechiae noted Psychiatric: Pt behavior is normal without agitation , irritable ? dysphoric affect, No other exam findings  Lab Results  Component Value Date   WBC 5.1 01/03/2018   HGB 15.0 01/03/2018   HCT 44.2 01/03/2018   PLT 84 (L) 01/03/2018   GLUCOSE 97 12/26/2017   CHOL 114 08/31/2017   TRIG 173.0 (H) 08/31/2017   HDL 33.10 (L) 08/31/2017   LDLDIRECT 75.0 06/22/2016   LDLCALC 46 08/31/2017   ALT 18 12/26/2017   AST 21 12/26/2017   NA 141 12/26/2017   K 3.5 12/26/2017   CL 103 12/26/2017   CREATININE 1.19 12/26/2017   BUN 20 12/26/2017   CO2 29 12/26/2017   TSH 2.007 12/26/2017   PSA 0.20 01/25/2017   INR 1.02 01/03/2018   HGBA1C 6.0 01/04/2018   MICROALBUR 3.1 (H) 01/25/2017       Assessment &  Plan:

## 2018-01-04 NOTE — Patient Instructions (Addendum)
Please remember to call for your yearly eye doctor appt  OK to stop the glipizide (glucotrol) due to the labile and low sugars  Your A1c was OK today  You will be contacted regarding the referral for: Wound clinic  Please call if you change your mind about the referral to Physical Therapy  Please continue all other medications as before, and refills have been done if requested.  Please have the pharmacy call with any other refills you may need.  Please continue your efforts at being more active, low cholesterol diet, and weight control.  You are otherwise up to date with prevention measures today.  Please keep your appointments with your specialists as you may have planned - orthopedic for the knees, and Hematology for the bone marrow results  Please return in 6 months, or sooner if needed

## 2018-01-05 ENCOUNTER — Encounter: Payer: Self-pay | Admitting: Internal Medicine

## 2018-01-05 DIAGNOSIS — R269 Unspecified abnormalities of gait and mobility: Secondary | ICD-10-CM | POA: Insufficient documentation

## 2018-01-05 NOTE — Assessment & Plan Note (Signed)
S/p CT bone marrow bx yesterday, results pending, has f/u later this month

## 2018-01-05 NOTE — Assessment & Plan Note (Signed)
Concerning with more unsteady and general weakness, but refuses PT evaluation

## 2018-01-05 NOTE — Assessment & Plan Note (Signed)
No active infection, but will need neosporin and gauze covering, for wound clinic referral

## 2018-01-05 NOTE — Assessment & Plan Note (Addendum)
For f/u a1c, ok to d/c glimeparide with several low sugars   In addition to the time spent performing CPE, I spent an additional 25 minutes face to face,in which greater than 50% of this time was spent in counseling and coordination of care for patient's acute illness as documented, including the differential dx, treatment, further evaluation and other management of DM, leg wounds, gait disorder and TCP

## 2018-01-05 NOTE — Assessment & Plan Note (Signed)

## 2018-01-15 ENCOUNTER — Encounter (HOSPITAL_COMMUNITY): Payer: Self-pay | Admitting: Hematology and Oncology

## 2018-01-16 ENCOUNTER — Inpatient Hospital Stay: Payer: Medicare Other | Admitting: Hematology and Oncology

## 2018-01-16 ENCOUNTER — Telehealth: Payer: Self-pay

## 2018-01-16 VITALS — BP 171/69 | HR 81 | Temp 97.9°F | Resp 22 | Ht 72.0 in | Wt 304.5 lb

## 2018-01-16 DIAGNOSIS — R600 Localized edema: Secondary | ICD-10-CM

## 2018-01-16 DIAGNOSIS — Z79899 Other long term (current) drug therapy: Secondary | ICD-10-CM

## 2018-01-16 DIAGNOSIS — Z86718 Personal history of other venous thrombosis and embolism: Secondary | ICD-10-CM | POA: Diagnosis not present

## 2018-01-16 DIAGNOSIS — I509 Heart failure, unspecified: Secondary | ICD-10-CM

## 2018-01-16 DIAGNOSIS — I2510 Atherosclerotic heart disease of native coronary artery without angina pectoris: Secondary | ICD-10-CM

## 2018-01-16 DIAGNOSIS — I252 Old myocardial infarction: Secondary | ICD-10-CM | POA: Diagnosis not present

## 2018-01-16 DIAGNOSIS — G4733 Obstructive sleep apnea (adult) (pediatric): Secondary | ICD-10-CM | POA: Diagnosis not present

## 2018-01-16 DIAGNOSIS — Z7982 Long term (current) use of aspirin: Secondary | ICD-10-CM

## 2018-01-16 DIAGNOSIS — M1990 Unspecified osteoarthritis, unspecified site: Secondary | ICD-10-CM | POA: Diagnosis not present

## 2018-01-16 DIAGNOSIS — Z7984 Long term (current) use of oral hypoglycemic drugs: Secondary | ICD-10-CM

## 2018-01-16 DIAGNOSIS — D696 Thrombocytopenia, unspecified: Secondary | ICD-10-CM | POA: Diagnosis not present

## 2018-01-16 DIAGNOSIS — R21 Rash and other nonspecific skin eruption: Secondary | ICD-10-CM

## 2018-01-16 DIAGNOSIS — I110 Hypertensive heart disease with heart failure: Secondary | ICD-10-CM

## 2018-01-16 DIAGNOSIS — E785 Hyperlipidemia, unspecified: Secondary | ICD-10-CM | POA: Diagnosis not present

## 2018-01-16 DIAGNOSIS — J449 Chronic obstructive pulmonary disease, unspecified: Secondary | ICD-10-CM

## 2018-01-16 DIAGNOSIS — D7589 Other specified diseases of blood and blood-forming organs: Secondary | ICD-10-CM | POA: Diagnosis not present

## 2018-01-16 DIAGNOSIS — F17210 Nicotine dependence, cigarettes, uncomplicated: Secondary | ICD-10-CM

## 2018-01-16 DIAGNOSIS — E1151 Type 2 diabetes mellitus with diabetic peripheral angiopathy without gangrene: Secondary | ICD-10-CM | POA: Diagnosis not present

## 2018-01-16 DIAGNOSIS — F418 Other specified anxiety disorders: Secondary | ICD-10-CM

## 2018-01-16 DIAGNOSIS — I878 Other specified disorders of veins: Secondary | ICD-10-CM

## 2018-01-16 LAB — CHROMOSOME ANALYSIS, BONE MARROW

## 2018-01-16 LAB — TISSUE HYBRIDIZATION (BONE MARROW)-NCBH

## 2018-01-16 NOTE — Telephone Encounter (Signed)
Printed avs and calender of upcoming appointment. Per 5/28 los

## 2018-01-17 ENCOUNTER — Telehealth: Payer: Self-pay

## 2018-01-17 NOTE — Telephone Encounter (Signed)
Called and left a message concerning his upcoming appointment. Per 5/29 los

## 2018-01-18 ENCOUNTER — Other Ambulatory Visit: Payer: Self-pay | Admitting: Internal Medicine

## 2018-01-20 ENCOUNTER — Other Ambulatory Visit: Payer: Self-pay | Admitting: Internal Medicine

## 2018-01-21 ENCOUNTER — Encounter: Payer: Self-pay | Admitting: Hematology and Oncology

## 2018-01-21 NOTE — Assessment & Plan Note (Signed)
67 y.o. Male with multiple comorbidities including constellation of pulmonary and cardiovascular diseases in the setting of diabetes mellitus.  Patient has chronic venous stasis in bilateral lower extremities exacerbated by congestive heart failure with associated chronic cutaneous changes. Initial hematological profile demonstrated moderated thrombocytopenia and no significant abnormality in the white blood cell lineage.  Hemoglobin was normal level, but the RBCs demonstrated significant macrocytosis and hyperchromia.  The combination of the red blood cell abnormalities and thrombocytopenia raised possibility of presence of myelodysplastic syndrome and patient was referred for a bone marrow biopsy which was obtained on 01/03/2018.  No morphological evidence for myelodysplasia was discovered during assessment.  In addition, labs obtained during the first visit in our clinic demonstrates significantly elevated methylmalonic acid.  Unfortunately, for some reason B12 level was not returned despite being ordered.  Elevated methylmalonic acid suggests deficiency in either folate or B12 and replacement is warranted.  This could account for both macrocytosis and thrombocytopenia, but thrombocytopenia could also be due to peripheral destruction or sequestration if splenomegaly is present.  Plan: - Start B12 supplementation 2000 mcg p.o. daily - Regardless of etiology of thrombocytopenia, current level appears to be adequate to proceed with joint injections or possibly even joint replacement from hematological standpoint.  If a larger surgery such as joint replacement is entertained, precaution could be taken by preparing 2 packs of platelets to be infused intraoperatively in case of significant bleeding. -We will continue monitoring patient for his hematological abnormalities: Patient will return to our clinic in 1 month with labs obtained 2 to 3 days prior to follow-up on levels of folate, B12, and methylmalonic  acid as well as hematological profile.

## 2018-01-21 NOTE — Progress Notes (Signed)
[facility] Follow-up Visit:  Assessment: Thrombocytopenia 67 y.o. Male with multiple comorbidities including constellation of pulmonary and cardiovascular diseases in the setting of diabetes mellitus.  Patient has chronic venous stasis in bilateral lower extremities exacerbated by congestive heart failure with associated chronic cutaneous changes. Initial hematological profile demonstrated moderated thrombocytopenia and no significant abnormality in the white blood cell lineage.  Hemoglobin was normal level, but the RBCs demonstrated significant macrocytosis and hyperchromia.  The combination of the red blood cell abnormalities and thrombocytopenia raised possibility of presence of myelodysplastic syndrome and patient was referred for a bone marrow biopsy which was obtained on 01/03/2018.  No morphological evidence for myelodysplasia was discovered during assessment.  In addition, labs obtained during the first visit in our clinic demonstrates significantly elevated methylmalonic acid.  Unfortunately, for some reason B12 level was not returned despite being ordered.  Elevated methylmalonic acid suggests deficiency in either folate or B12 and replacement is warranted.  This could account for both macrocytosis and thrombocytopenia, but thrombocytopenia could also be due to peripheral destruction or sequestration if splenomegaly is present.  Plan: - Start B12 supplementation 2000 mcg p.o. daily - Regardless of etiology of thrombocytopenia, current level appears to be adequate to proceed with joint injections or possibly even joint replacement from hematological standpoint.  If a larger surgery such as joint replacement is entertained, precaution could be taken by preparing 2 packs of platelets to be infused intraoperatively in case of significant bleeding. -We will continue monitoring patient for his hematological abnormalities: Patient will return to our clinic in 1 month with labs  obtained 2 to 3 days prior to follow-up on levels of folate, B12, and methylmalonic acid as well as hematological profile.   Voice recognition software was used and creation of this note. Despite my best effort at editing the text, some misspelling/errors may have occurred.  No orders of the defined types were placed in this encounter.   Cancer Staging No matching staging information was found for the patient.  All questions were answered.  . The patient knows to call the clinic with any problems, questions or concerns.  This note was electronically signed.    History of Presenting Illness MESIAH MANZO is a 67 y.o. followed in the [facility] for thrombocytopenia evaluation, referred by Dr Biagio Borg.  Patient's past medical history is significant for anxiety, arthritis, congestive heart failure, history of vascular claudications, COPD, coronary artery disease including previous myocardial infarction, previous history of stroke, diabetes mellitus type 2, history of DVT, migraine headaches, morbid obesity, obstructive sleep apnea.   Patient is currently at baseline of his health.  His dominant complaint is persistent and uncomfortable swelling in bilateral lower extremities associated with chronic discoloration of the skin and subcutaneous blistering.  No bleeding, purulent drainage, or acute erythema.  No fevers, chills, night sweats.  Oncological/hematological History: --Labs, 12/09/17: WBC 4.8, Hgb 14.9, MCV 103.7, MCH 34.3, MCHC 33.0, RDW 13.5, Plt 68 --Labs, 12/26/17: WBC 4.4, Hgb 14.7, MCV 102.7, MCH 34.3, MCHC 33.4, RDW 13.4, Plt 83; Folate 7.0, MMA 398, LDH 164 --Labs, 01/03/18: WBC 5.1, Hgb 15.0, MCV 104.7, MCH 35.5, MCHC 33.9, RDW 13.6, Plt 84 --BM Bx, 01/03/18: Hypercellular bone marrow without morphological evidence of dyspoiesis.  Normal levels of megakaryocytes without abnormal forms.  Oncological/hematological History:  No history exists.    Medical  History: Past Medical History:  Diagnosis Date  . ALLERGIC RHINITIS 06/24/2007  . 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 ANXIETY 06/24/2007  .  Arthritis    "hands, knees, hips, neck; q part of the body" (01/27/2015)  . CELLULITIS AND ABSCESS OF LEG EXCEPT FOOT 03/28/2010  . Cellulitis of left leg 01/27/2015  . CHF (congestive heart failure) (HCC)   . Chronic lower back pain    "I have a steel rod in my back"  . Claudication (HCC) 06/02/2011  . COLONIC POLYPS, HX OF 06/24/2007  . Complication of anesthesia    "they can't put me asleep cause I don't wake up" (01/27/2015)  . CONSTIPATION, CHRONIC, HX OF 06/24/2007  . COPD (chronic obstructive pulmonary disease) (HCC)   . Coronary artery disease   . CVA (cerebral vascular accident) (HCC) 1990's   "w/MI"; denies residual on 01/27/2015  . DEPRESSION 06/24/2007  . DIABETES MELLITUS, TYPE II dx'd 2015  . DIVERTICULOSIS, COLON 06/24/2007  . DVT (deep venous thrombosis) (HCC)   . Heart murmur   . HYPERLIPIDEMIA 06/24/2007  . HYPERTENSION 06/24/2007  . INSOMNIA-SLEEP DISORDER-UNSPEC 01/24/2010  . Migraine    "comes over my right eye q now and then" (01/27/2015)  . Morbid obesity (HCC) 12/21/2015  . Myocardial infarction (HCC) 1990's X 1   "w/stroke"  . OSA (obstructive sleep apnea)   . SKIN LESION 01/24/2010  . Unspecified Peripheral Vascular Disease 06/24/2007    Surgical History: Past Surgical History:  Procedure Laterality Date  . ANTERIOR CERVICAL DECOMP/DISCECTOMY FUSION  1985   "job related injury"  . BACK SURGERY    . KNEE ARTHROSCOPY Bilateral 1990  . LIGAMENT REPAIR Right 1999   "wrist; job related injury"  . LUMBAR LAMINECTOMY  1990's    Family History: Family History  Problem Relation Age of Onset  . COPD Father   . Heart disease Father   . Cancer Mother        lung cancer  . Heart disease Maternal Grandmother   . Heart disease Maternal Grandfather   . Heart disease Brother   . Hypertension Brother   . Heart attack Brother   . Peripheral  vascular disease Brother     Social History: Social History   Socioeconomic History  . Marital status: Married    Spouse name: Not on file  . Number of children: 3  . Years of education: Not on file  . Highest education level: Not on file  Occupational History  . 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 Financial resource strain: Not on file  . Food insecurity:    Worry: Not on file    Inability: Not on file  . Transportation needs:    Medical: Not on file    Non-medical: Not on file  Tobacco Use  . Smoking status: Current Every Day Smoker    Years: 48.00    Types: Cigars  . Smokeless tobacco: Never Used  . Tobacco comment: 01/27/2015 pt states he smokes 5 cigar cigarettes daily  Substance and Sexual Activity  . Alcohol use: Yes    Alcohol/week: 0.0 oz    Comment: 01/27/2015 "might have a couple beers/yr"  . Drug use: No  . Sexual activity: Never  Lifestyle  . Physical activity:    Days per week: Not on file    Minutes per session: Not on file  . Stress: Not on file  Relationships  . Social connections:    Talks on phone: Not on file    Gets together: Not on file    Attends religious service: Not on file      Active member of club or organization: Not on file    Attends meetings of clubs or organizations: Not on file    Relationship status: Not on file  . Intimate partner violence:    Fear of current or ex partner: Not on file    Emotionally abused: Not on file    Physically abused: Not on file    Forced sexual activity: Not on file  Other Topics Concern  . Not on file  Social History Narrative  . Not on file    Allergies: Allergies  Allergen Reactions  . Oxycodone Other (See Comments)    Sedation/somnolence    Medications:  Current Outpatient Medications  Medication Sig Dispense Refill  . aspirin 81 MG tablet Take 81 mg by mouth daily.      . Blood Glucose Monitoring Suppl (ACCU-CHEK NANO SMARTVIEW)  w/Device KIT use as directed to check insulin levels 1 kit 0  . Blood Glucose Monitoring Suppl DEVI 1 Device by Does not apply route daily. 1 each 0  . clonazePAM (KLONOPIN) 0.5 MG tablet Take 1-2 TABLET BY MOUTH at bedtime, can take up to 2 depending on anxiety 60 tablet 5  . fenofibrate 160 MG tablet Take 1 tablet (160 mg total) by mouth daily. 90 tablet 3  . furosemide (LASIX) 20 MG tablet Take 0.5 tablets (10 mg total) by mouth daily. 30 tablet 5  . gabapentin (NEURONTIN) 100 MG capsule Take 2 capsules (200 mg total) by mouth at bedtime. 180 capsule 3  . glucose blood test strip Use as instructed 100 each 12  . hydrochlorothiazide (HYDRODIURIL) 25 MG tablet Take 1 tablet (25 mg total) by mouth daily. 90 tablet 3  . labetalol (NORMODYNE) 300 MG tablet TAKE 1 TABLET (300 MG TOTAL) BY MOUTH 2 (TWO) TIMES DAILY. 60 tablet 11  . Lancets (ACCU-CHEK SOFT TOUCH) lancets Use as instructed 100 each 12  . metFORMIN (GLUCOPHAGE) 1000 MG tablet TAKE 1 TABLET (1,000 MG TOTAL) BY MOUTH 2 (TWO) TIMES DAILY WITH A MEAL. 60 tablet 11  . simvastatin (ZOCOR) 80 MG tablet Take 1 tablet (80 mg total) by mouth at bedtime. 30 tablet 5   No current facility-administered medications for this visit.     Review of Systems: Review of Systems  Cardiovascular: Positive for leg swelling.  Skin: Positive for rash.  All other systems reviewed and are negative.    PHYSICAL EXAMINATION Blood pressure (!) 171/69, pulse 81, temperature 97.9 F (36.6 C), temperature source Oral, resp. rate (!) 22, height 6' (1.829 m), weight (!) 304 lb 8 oz (138.1 kg), SpO2 97 %.  ECOG PERFORMANCE STATUS: 3 - Symptomatic, >50% confined to bed  Physical Exam  Constitutional: He is oriented to person, place, and time. He appears well-developed and well-nourished. No distress.  HENT:  Head: Normocephalic and atraumatic.  Mouth/Throat: Oropharynx is clear and moist. No oropharyngeal exudate.  Eyes: Pupils are equal, round, and reactive  to light. Conjunctivae and EOM are normal. No scleral icterus.  Neck: No thyromegaly present.  Cardiovascular: Normal rate, regular rhythm and normal heart sounds. Exam reveals no gallop and no friction rub.  No murmur heard. Pulmonary/Chest: Effort normal and breath sounds normal. No stridor. No respiratory distress. He has no wheezes. He has no rales.  Abdominal: Soft. Bowel sounds are normal. He exhibits no distension and no mass. There is no tenderness. There is no guarding.  Musculoskeletal: He exhibits edema.  Bilateral 3+ pitting edema in the lower extremities with associated cutaneous discoloration and   vesiculation of the skin.  Lymphadenopathy:    He has no cervical adenopathy.  Neurological: He is alert and oriented to person, place, and time. He displays normal reflexes. No cranial nerve deficit or sensory deficit.  Skin: No rash noted. He is not diaphoretic. There is erythema. No pallor.     LABORATORY DATA: I have personally reviewed the data as listed: No visits with results within 1 Week(s) from this visit.  Latest known visit with results is:  Office Visit on 01/04/2018  Component Date Value Ref Range Status  . Hemoglobin A1C 01/04/2018 6.0   Final       Mikhail G Perlov, MD  

## 2018-01-29 ENCOUNTER — Encounter (HOSPITAL_BASED_OUTPATIENT_CLINIC_OR_DEPARTMENT_OTHER): Payer: Medicare Other | Attending: Internal Medicine

## 2018-01-29 DIAGNOSIS — L97221 Non-pressure chronic ulcer of left calf limited to breakdown of skin: Secondary | ICD-10-CM | POA: Diagnosis not present

## 2018-01-29 DIAGNOSIS — I509 Heart failure, unspecified: Secondary | ICD-10-CM | POA: Diagnosis not present

## 2018-01-29 DIAGNOSIS — I872 Venous insufficiency (chronic) (peripheral): Secondary | ICD-10-CM | POA: Diagnosis not present

## 2018-01-29 DIAGNOSIS — S81802A Unspecified open wound, left lower leg, initial encounter: Secondary | ICD-10-CM | POA: Diagnosis not present

## 2018-01-29 DIAGNOSIS — I890 Lymphedema, not elsewhere classified: Secondary | ICD-10-CM | POA: Insufficient documentation

## 2018-01-29 DIAGNOSIS — G4733 Obstructive sleep apnea (adult) (pediatric): Secondary | ICD-10-CM | POA: Diagnosis not present

## 2018-01-29 DIAGNOSIS — E119 Type 2 diabetes mellitus without complications: Secondary | ICD-10-CM | POA: Diagnosis not present

## 2018-01-29 DIAGNOSIS — Z7984 Long term (current) use of oral hypoglycemic drugs: Secondary | ICD-10-CM | POA: Insufficient documentation

## 2018-01-29 DIAGNOSIS — J449 Chronic obstructive pulmonary disease, unspecified: Secondary | ICD-10-CM | POA: Insufficient documentation

## 2018-01-29 DIAGNOSIS — F17210 Nicotine dependence, cigarettes, uncomplicated: Secondary | ICD-10-CM | POA: Diagnosis not present

## 2018-01-29 DIAGNOSIS — I2510 Atherosclerotic heart disease of native coronary artery without angina pectoris: Secondary | ICD-10-CM | POA: Insufficient documentation

## 2018-01-29 DIAGNOSIS — L97211 Non-pressure chronic ulcer of right calf limited to breakdown of skin: Secondary | ICD-10-CM | POA: Diagnosis not present

## 2018-01-29 DIAGNOSIS — S81801A Unspecified open wound, right lower leg, initial encounter: Secondary | ICD-10-CM | POA: Diagnosis not present

## 2018-01-29 DIAGNOSIS — Z86718 Personal history of other venous thrombosis and embolism: Secondary | ICD-10-CM | POA: Diagnosis not present

## 2018-01-29 DIAGNOSIS — I87332 Chronic venous hypertension (idiopathic) with ulcer and inflammation of left lower extremity: Secondary | ICD-10-CM | POA: Insufficient documentation

## 2018-01-29 DIAGNOSIS — Z8673 Personal history of transient ischemic attack (TIA), and cerebral infarction without residual deficits: Secondary | ICD-10-CM | POA: Insufficient documentation

## 2018-01-29 DIAGNOSIS — I110 Hypertensive heart disease with heart failure: Secondary | ICD-10-CM | POA: Insufficient documentation

## 2018-02-14 ENCOUNTER — Other Ambulatory Visit: Payer: Self-pay | Admitting: Internal Medicine

## 2018-02-14 ENCOUNTER — Encounter: Payer: Self-pay | Admitting: Hematology

## 2018-02-14 ENCOUNTER — Inpatient Hospital Stay (HOSPITAL_BASED_OUTPATIENT_CLINIC_OR_DEPARTMENT_OTHER): Payer: Medicare Other | Admitting: Hematology

## 2018-02-14 ENCOUNTER — Inpatient Hospital Stay: Payer: Medicare Other | Attending: Hematology and Oncology

## 2018-02-14 ENCOUNTER — Telehealth: Payer: Self-pay

## 2018-02-14 VITALS — BP 169/71 | HR 72 | Temp 97.8°F | Resp 18 | Ht 71.0 in | Wt 299.8 lb

## 2018-02-14 DIAGNOSIS — F17210 Nicotine dependence, cigarettes, uncomplicated: Secondary | ICD-10-CM | POA: Diagnosis not present

## 2018-02-14 DIAGNOSIS — J449 Chronic obstructive pulmonary disease, unspecified: Secondary | ICD-10-CM | POA: Diagnosis not present

## 2018-02-14 DIAGNOSIS — E785 Hyperlipidemia, unspecified: Secondary | ICD-10-CM

## 2018-02-14 DIAGNOSIS — I110 Hypertensive heart disease with heart failure: Secondary | ICD-10-CM | POA: Insufficient documentation

## 2018-02-14 DIAGNOSIS — G4733 Obstructive sleep apnea (adult) (pediatric): Secondary | ICD-10-CM

## 2018-02-14 DIAGNOSIS — R200 Anesthesia of skin: Secondary | ICD-10-CM | POA: Diagnosis not present

## 2018-02-14 DIAGNOSIS — I252 Old myocardial infarction: Secondary | ICD-10-CM | POA: Insufficient documentation

## 2018-02-14 DIAGNOSIS — I509 Heart failure, unspecified: Secondary | ICD-10-CM | POA: Diagnosis not present

## 2018-02-14 DIAGNOSIS — Z7984 Long term (current) use of oral hypoglycemic drugs: Secondary | ICD-10-CM | POA: Diagnosis not present

## 2018-02-14 DIAGNOSIS — E1151 Type 2 diabetes mellitus with diabetic peripheral angiopathy without gangrene: Secondary | ICD-10-CM | POA: Insufficient documentation

## 2018-02-14 DIAGNOSIS — Z79899 Other long term (current) drug therapy: Secondary | ICD-10-CM

## 2018-02-14 DIAGNOSIS — R251 Tremor, unspecified: Secondary | ICD-10-CM | POA: Diagnosis not present

## 2018-02-14 DIAGNOSIS — M7989 Other specified soft tissue disorders: Secondary | ICD-10-CM | POA: Insufficient documentation

## 2018-02-14 DIAGNOSIS — D696 Thrombocytopenia, unspecified: Secondary | ICD-10-CM | POA: Diagnosis not present

## 2018-02-14 DIAGNOSIS — Z7982 Long term (current) use of aspirin: Secondary | ICD-10-CM

## 2018-02-14 DIAGNOSIS — D7589 Other specified diseases of blood and blood-forming organs: Secondary | ICD-10-CM

## 2018-02-14 DIAGNOSIS — I2510 Atherosclerotic heart disease of native coronary artery without angina pectoris: Secondary | ICD-10-CM | POA: Insufficient documentation

## 2018-02-14 LAB — CMP (CANCER CENTER ONLY)
ALBUMIN: 4.1 g/dL (ref 3.5–5.0)
ALK PHOS: 43 U/L (ref 38–126)
ALT: 14 U/L (ref 0–44)
AST: 15 U/L (ref 15–41)
Anion gap: 8 (ref 5–15)
BILIRUBIN TOTAL: 0.5 mg/dL (ref 0.3–1.2)
BUN: 16 mg/dL (ref 8–23)
CO2: 27 mmol/L (ref 22–32)
Calcium: 9.6 mg/dL (ref 8.9–10.3)
Chloride: 108 mmol/L (ref 98–111)
Creatinine: 1.06 mg/dL (ref 0.61–1.24)
GFR, Est AFR Am: >60 mL/min (ref >60)
GFR, Estimated: >60 mL/min (ref >60)
GLUCOSE: 106 mg/dL — AB (ref 70–99)
Potassium: 3.8 mmol/L (ref 3.5–5.1)
Sodium: 143 mmol/L (ref 135–145)
TOTAL PROTEIN: 6.8 g/dL (ref 6.5–8.1)

## 2018-02-14 LAB — CBC WITH DIFFERENTIAL (CANCER CENTER ONLY)
BASOS ABS: 0 10*3/uL (ref 0.0–0.1)
BASOS PCT: 1 %
EOS PCT: 2 %
Eosinophils Absolute: 0.1 10*3/uL (ref 0.0–0.5)
HCT: 44.5 % (ref 38.4–49.9)
Hemoglobin: 14.8 g/dL (ref 13.0–17.1)
LYMPHS PCT: 28 %
Lymphs Abs: 1.1 10*3/uL (ref 0.9–3.3)
MCH: 34.3 pg — ABNORMAL HIGH (ref 27.2–33.4)
MCHC: 33.3 g/dL (ref 32.0–36.0)
MCV: 103.2 fL — AB (ref 79.3–98.0)
MONO ABS: 0.3 10*3/uL (ref 0.1–0.9)
MONOS PCT: 7 %
Neutro Abs: 2.4 10*3/uL (ref 1.5–6.5)
Neutrophils Relative %: 62 %
PLATELETS: 67 10*3/uL — AB (ref 140–400)
RBC: 4.31 MIL/uL (ref 4.20–5.82)
RDW: 13.6 % (ref 11.0–14.6)
WBC Count: 3.9 10*3/uL — ABNORMAL LOW (ref 4.0–10.3)

## 2018-02-14 LAB — FOLATE: Folate: 7.3 ng/mL (ref >5.9)

## 2018-02-14 LAB — VITAMIN B12: Vitamin B-12: 714 pg/mL (ref 180–914)

## 2018-02-14 NOTE — Telephone Encounter (Signed)
Printed avs and calender of upcoming appointments per 6/27 los

## 2018-02-14 NOTE — Progress Notes (Signed)
HEMATOLOGY/ONCOLOGY CONSULTATION NOTE  Date of Service: 02/14/2018  Patient Care Team: Biagio Borg, MD as PCP - General Hulan Saas, MD (Inactive) as Resident  CHIEF COMPLAINTS/PURPOSE OF CONSULTATION:  Thrombocytopenia  HISTORY OF PRESENTING ILLNESS:   Eric [name] is a wonderful 67 y.o. male who has been previously followed by my colleague Dr Grace Isaac for evaluation and management of Thrombocytopenia. He is accompanied today by his wife. The pt reports that he is doing well overall.   The pt reports that his knees have continued to be painful. He has not had any abnormal bruises, nor abnormal bleeding. He takes Acetaminophen 525m for knee pain and has recently begun Vitamin B12. He denies using other OTC pain medications and NSAIDs.  He denies any liver problems in the past.   He notes that his hands occasionally shake and feel tingling and numbness.   The pt notes that he had a lot of leg swelling and blisters in the last couple weeks that has dissipated.   Most recent lab results (02/14/18) of CBC w/diff and CMP is as follows: all values are WNL except for WBC at 3.9k, MCV at 103.2, MCH at 34.3, PLT at 67k, Glucose at 106. Vitamin B12 on 02/14/18 is normal at 7329Folic acid 69/24/26is normal at 7.3 Methylmalonic acid 02/14/18 is 386  On review of systems, pt reports resolving leg swelling, tingling in numbness in the hands, and denies bleeding, bruising, blood in the stools, blood in the urine, abdominal pains, nose bleeds, gum bleeds, and any other symptoms.   On PMHx the pt denies liver problems. On Social Hx the pt denies any ETOH consumption   MEDICAL HISTORY:  Past Medical History:  Diagnosis Date  . ALLERGIC RHINITIS 06/24/2007  . 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 ANXIETY 06/24/2007  . Arthritis    "hands, knees, hips, neck; q part of the body" (01/27/2015)  . CELLULITIS AND ABSCESS OF LEG EXCEPT FOOT 03/28/2010  . Cellulitis of left leg 01/27/2015  . CH[place]  [place] heart failure) [place]   . Chronic lower back pain    "I have a steel rod in my back"  . Claudication ([place] 06/02/2011  . COLONIC POLYPS, HX OF 06/24/2007  . Complication of anesthesia    "they can't put me asleep cause I don't wake up" (01/27/2015)  . CONSTIPATION, CHRONIC, HX OF 06/24/2007  . COPD (chronic obstructive pulmonary disease) ([facility]   . Coronary artery disease   . CVA (cerebral vascular accident) ([facility] 1990's   "w/MI"; denies residual on 01/27/2015  . DEPRESSION 06/24/2007  . DIABETES MELLITUS, TYPE II dx'd 2015  . DIVERTICULOSIS, COLON 06/24/2007  . DVT (deep venous thrombosis) ([facility]   . Heart murmur   . HYPERLIPIDEMIA 06/24/2007  . HYPERTENSION 06/24/2007  . INSOMNIA-SLEEP DISORDER-UNSPEC 01/24/2010  . Migraine    "comes over my right eye q now and then" (01/27/2015)  . Morbid obesity ([facility] 12/21/2015  . Myocardial infarction ([place] 1990's X 1   "w/stroke"  . OSA (obstructive sleep apnea)   . SKIN LESION 01/24/2010  . Unspecified Peripheral Vascular Disease 06/24/2007    SURGICAL HISTORY: Past Surgical History:  Procedure Laterality Date  . ANTERIOR CERVICAL DECOMP/DISCECTOMY FUSION  1985   "job related injury"  . BACK SURGERY    . KNEE ARTHROSCOPY Bilateral 1990  . LIGAMENT REPAIR Right 1999   "wrist; job related injury"  . LUMBAR LAMINECTOMY  1990's    SOCIAL HISTORY: Social History   Socioeconomic History  .  Marital status: Married    Spouse name: Not on file  . Number of children: 3  . Years of education: Not on file  . Highest education level: Not on file  Occupational History  . 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 Financial resource strain: Not on file  . Food insecurity:    Worry: Not on file    Inability: Not on file  . Transportation needs:    Medical: Not on file    Non-medical: Not on file  Tobacco Use  . Smoking status: Current Every Day Smoker    Years: 48.00    Types: Cigars    . Smokeless tobacco: Never Used  . Tobacco comment: 01/27/2015 pt states he smokes 5 cigar cigarettes daily  Substance and Sexual Activity  . Alcohol use: Yes    Alcohol/week: 0.0 oz    Comment: 01/27/2015 "might have a couple beers/yr"  . Drug use: No  . Sexual activity: Never  Lifestyle  . Physical activity:    Days per week: Not on file    Minutes per session: Not on file  . Stress: Not on file  Relationships  . Social connections:    Talks on phone: Not on file    Gets together: Not on file    Attends religious service: Not on file    Active member of club or organization: Not on file    Attends meetings of clubs or organizations: Not on file    Relationship status: Not on file  . Intimate partner violence:    Fear of current or ex partner: Not on file    Emotionally abused: Not on file    Physically abused: Not on file    Forced sexual activity: Not on file  Other Topics Concern  . Not on file  Social History Narrative  . Not on file    FAMILY HISTORY: Family History  Problem Relation Age of Onset  . COPD Father   . Heart disease Father   . Cancer Mother        lung cancer  . Heart disease Maternal Grandmother   . Heart disease Maternal Grandfather   . Heart disease Brother   . Hypertension Brother   . Heart attack Brother   . Peripheral vascular disease Brother     ALLERGIES:  is allergic to oxycodone.  MEDICATIONS:  Current Outpatient Medications  Medication Sig Dispense Refill  . aspirin 81 MG tablet Take 81 mg by mouth daily.      . Blood Glucose Monitoring Suppl (ACCU-CHEK NANO SMARTVIEW) w/Device KIT use as directed to check insulin levels 1 kit 0  . Blood Glucose Monitoring Suppl DEVI 1 Device by Does not apply route daily. 1 each 0  . clonazePAM (KLONOPIN) 0.5 MG tablet Take 1-2 TABLET BY MOUTH at bedtime, can take up to 2 depending on anxiety 60 tablet 5  . fenofibrate 160 MG tablet Take 1 tablet (160 mg total) by mouth daily. 90 tablet 3  .  furosemide (LASIX) 20 MG tablet Take 0.5 tablets (10 mg total) by mouth daily. 30 tablet 5  . gabapentin (NEURONTIN) 100 MG capsule Take 2 capsules (200 mg total) by mouth at bedtime. 180 capsule 3  . glucose blood test strip Use as instructed 100 each 12  . hydrochlorothiazide (HYDRODIURIL) 25 MG tablet Take 1 tablet (25 mg total) by mouth daily. 90 tablet 3  . labetalol (NORMODYNE) 300 MG  tablet TAKE 1 TABLET (300 MG TOTAL) BY MOUTH 2 (TWO) TIMES DAILY. 60 tablet 11  . Lancets (ACCU-CHEK SOFT TOUCH) lancets Use as instructed 100 each 12  . metFORMIN (GLUCOPHAGE) 1000 MG tablet TAKE 1 TABLET (1,000 MG TOTAL) BY MOUTH 2 (TWO) TIMES DAILY WITH A MEAL. 60 tablet 11  . simvastatin (ZOCOR) 80 MG tablet Take 1 tablet (80 mg total) by mouth at bedtime. 30 tablet 5   No current facility-administered medications for this visit.     REVIEW OF SYSTEMS:    10 Point review of Systems was done is negative except as noted above.  PHYSICAL EXAMINATION: . Vitals:   02/14/18 1424  BP: (!) 169/71  Pulse: 72  Resp: 18  Temp: 97.8 F (36.6 C)  SpO2: (!) 72%   Filed Weights   02/14/18 1424  Weight: 299 lb 12.8 oz (136 kg)   .Body mass index is 41.81 kg/m.  GENERAL:alert, in no acute distress and comfortable SKIN: no acute rashes, no significant lesions EYES: conjunctiva are pink and non-injected, sclera anicteric OROPHARYNX: MMM, no exudates, no oropharyngeal erythema or ulceration NECK: supple, no JVD LYMPH:  no palpable lymphadenopathy in the cervical, axillary or inguinal regions LUNGS: clear to auscultation b/l with normal respiratory effort HEART: regular rate & rhythm ABDOMEN:  normoactive bowel sounds , non tender, not distended. Extremity: no pedal edema PSYCH: alert & oriented x 3 with fluent speech NEURO: no focal motor/sensory deficits  LABORATORY DATA:  I have reviewed the data as listed  . CBC Latest Ref Rng & Units 02/14/2018 01/03/2018 12/26/2017  WBC 4.0 - 10.3 K/uL 3.9(L)  5.1 4.4  Hemoglobin 13.0 - 17.1 g/dL 14.8 15.0 14.7  Hematocrit 38.4 - 49.9 % 44.5 44.2 44.1  Platelets 140 - 400 K/uL 67(L) 84(L) 83(L)    . CMP Latest Ref Rng & Units 02/14/2018 12/26/2017 12/09/2017  Glucose 70 - 99 mg/dL 106(H) 97 222(H)  BUN 8 - 23 mg/dL 16 20 15   Creatinine 0.61 - 1.24 mg/dL 1.06 1.19 1.27(H)  Sodium 135 - 145 mmol/L 143 141 137  Potassium 3.5 - 5.1 mmol/L 3.8 3.5 3.2(L)  Chloride 98 - 111 mmol/L 108 103 103  CO2 22 - 32 mmol/L 27 29 23   Calcium 8.9 - 10.3 mg/dL 9.6 10.3 9.1  Total Protein 6.5 - 8.1 g/dL 6.8 7.1 -  Total Bilirubin 0.3 - 1.2 mg/dL 0.5 0.5 -  Alkaline Phos 38 - 126 U/L 43 40 -  AST 15 - 41 U/L 15 21 -  ALT 0 - 44 U/L 14 18 -   01/03/18 BM Bx:  01/03/18 Cytogenetics:     RADIOGRAPHIC STUDIES: I have personally reviewed the radiological images as listed and agreed with the findings in the report. No results found.  ASSESSMENT & PLAN:  67 y.o. male with  1. Thrombocytopenia - chronic thrombocytopenia. No issues with bleeding PLAN -Discussed patient's most recent labs from 02/14/18, no anemia with HGB at 14.8, PLT at 67k -PLT counts do not present a reason for aggressive treatment or transfusion at this time. -Reviewed 01/03/18 BM Bx and Cytogenetics which was technically negative -Recommend continuing Vitamin B12 and starting Vitamin B complex -Concerned for peripheral destruction of PLT  -Recommend US Abdomen for liver and spleen -Will see pt back in 4 months -If PLT go beneath 50k, will need to consider NPlate -Discussed that pt is not currently at risk for spontaneous bleeding at this time   [place] abd in 16 weeks RTC with Dr  Vickii Volland in 4 months with labs     All of the patients questions were answered with apparent satisfaction. The patient knows to call the clinic with any problems, questions or concerns.  . The total time spent in the appointment was 25 minutes and more than 50% was on counseling and direct patient cares.        Sullivan Lone MD MS AAHIVMS [facility] Hematology/Oncology Physician [facility]  (Office):       [phone] (Work cell):  [phone] (Fax):           [phone]  02/14/2018 3:10 PM  I, Baldwin [place], am acting as a Education administrator for Dr Irene Limbo.   .I have reviewed the above documentation for accuracy and completeness, and I agree with the above. Brunetta Genera MD

## 2018-02-16 ENCOUNTER — Other Ambulatory Visit: Payer: Self-pay | Admitting: Internal Medicine

## 2018-02-16 LAB — METHYLMALONIC ACID, SERUM: Methylmalonic Acid, Quantitative: 386 nmol/L — ABNORMAL HIGH (ref 0–378)

## 2018-02-20 DIAGNOSIS — M1711 Unilateral primary osteoarthritis, right knee: Secondary | ICD-10-CM | POA: Diagnosis not present

## 2018-02-25 ENCOUNTER — Other Ambulatory Visit: Payer: Self-pay | Admitting: Internal Medicine

## 2018-02-26 ENCOUNTER — Ambulatory Visit: Payer: Medicare Other | Admitting: Internal Medicine

## 2018-03-11 ENCOUNTER — Other Ambulatory Visit: Payer: Medicare Other

## 2018-03-13 ENCOUNTER — Ambulatory Visit: Payer: Medicare Other | Admitting: Hematology

## 2018-05-01 ENCOUNTER — Ambulatory Visit (INDEPENDENT_AMBULATORY_CARE_PROVIDER_SITE_OTHER): Payer: Medicare Other | Admitting: *Deleted

## 2018-05-01 DIAGNOSIS — Z23 Encounter for immunization: Secondary | ICD-10-CM

## 2018-05-02 ENCOUNTER — Other Ambulatory Visit: Payer: Self-pay | Admitting: Internal Medicine

## 2018-05-02 NOTE — Telephone Encounter (Signed)
Done erx 

## 2018-06-12 ENCOUNTER — Telehealth: Payer: Self-pay | Admitting: Hematology

## 2018-06-12 NOTE — Telephone Encounter (Signed)
patient called to cancel all appointment. He said he is not coming back, he can not afford it

## 2018-06-13 ENCOUNTER — Inpatient Hospital Stay: Payer: Medicare Other

## 2018-06-13 ENCOUNTER — Inpatient Hospital Stay: Payer: Medicare Other | Admitting: Hematology

## 2018-07-12 ENCOUNTER — Ambulatory Visit: Payer: Medicare Other | Admitting: Internal Medicine

## 2018-07-23 ENCOUNTER — Encounter: Payer: Self-pay | Admitting: Internal Medicine

## 2018-07-23 ENCOUNTER — Ambulatory Visit (INDEPENDENT_AMBULATORY_CARE_PROVIDER_SITE_OTHER): Payer: Medicare Other | Admitting: Internal Medicine

## 2018-07-23 VITALS — BP 138/90 | HR 83 | Temp 97.8°F | Ht 71.0 in | Wt 280.0 lb

## 2018-07-23 DIAGNOSIS — Z0000 Encounter for general adult medical examination without abnormal findings: Secondary | ICD-10-CM

## 2018-07-23 DIAGNOSIS — E1140 Type 2 diabetes mellitus with diabetic neuropathy, unspecified: Secondary | ICD-10-CM

## 2018-07-23 DIAGNOSIS — E785 Hyperlipidemia, unspecified: Secondary | ICD-10-CM

## 2018-07-23 DIAGNOSIS — I10 Essential (primary) hypertension: Secondary | ICD-10-CM

## 2018-07-23 LAB — POCT GLYCOSYLATED HEMOGLOBIN (HGB A1C)
HBA1C, POC (CONTROLLED DIABETIC RANGE): 0 % (ref 0.0–7.0)
HbA1c POC (<> result, manual entry): 0 % (ref 4.0–5.6)
HbA1c, POC (prediabetic range): 0 % — AB (ref 5.7–6.4)
Hemoglobin A1C: 5.9 % — AB (ref 4.0–5.6)

## 2018-07-23 NOTE — Progress Notes (Signed)
Subjective:    Patient ID: Eric [name], male    DOB: 10-30-1950, 67 y.o.   MRN: [id number]  HPI  Here to f/u; overall doing ok,  Pt denies chest pain, increasing sob or doe, wheezing, orthopnea, PND, increased LE swelling, palpitations, dizziness or syncope.  Pt denies new neurological symptoms such as new headache, or facial or extremity weakness or numbness.  Pt denies polydipsia, polyuria, or low sugar episode.  Pt states overall good compliance with meds, mostly trying to follow appropriate diet, with wt overall stable,  but little exercise however. Lost 25 more lbs in last 6 mo with diet. Wt Readings from Last 3 Encounters:  07/23/18 280 lb (127 kg)  02/14/18 299 lb 12.8 oz (136 kg)  01/16/18 (!) 304 lb 8 oz (138.1 kg)  Sister in law passed last Friday after 1 yr NH. Grieving now.  Has ongoing macrocytic anemia and TCP, tx with vit B, but he does not take as the pill tastes so bad.    Pt continues to have recurring right LBP without change in severity, bowel or bladder change, fever, wt loss,  worsening LE pain/numbness/weakness, but now walks with cane, no recent falls.   Pt denies fever, wt loss, night sweats, loss of appetite, or other constitutional symptoms  Past Medical History:  Diagnosis Date  . ALLERGIC RHINITIS 06/24/2007  . 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 ANXIETY 06/24/2007  . Arthritis    "hands, knees, hips, neck; q part of the body" (01/27/2015)  . CELLULITIS AND ABSCESS OF LEG EXCEPT FOOT 03/28/2010  . Cellulitis of left leg 01/27/2015  . CHF (congestive heart failure) ([place])   . Chronic lower back pain    "I have a steel rod in my back"  . Claudication ([place]) 06/02/2011  . COLONIC POLYPS, HX OF 06/24/2007  . Complication of anesthesia    "they can't put me asleep cause I don't wake up" (01/27/2015)  . CONSTIPATION, CHRONIC, HX OF 06/24/2007  . COPD (chronic obstructive pulmonary disease) ([place])   . Coronary artery disease   . CVA (cerebral vascular accident) [facility]) 1990's   "w/MI";  denies residual on 01/27/2015  . DEPRESSION 06/24/2007  . DIABETES MELLITUS, TYPE II dx'd 2015  . DIVERTICULOSIS, COLON 06/24/2007  . DVT (deep venous thrombosis) ([place])   . Heart murmur   . HYPERLIPIDEMIA 06/24/2007  . HYPERTENSION 06/24/2007  . INSOMNIA-SLEEP DISORDER-UNSPEC 01/24/2010  . Migraine    "comes over my right eye q now and then" (01/27/2015)  . Morbid obesity ([place]) 12/21/2015  . Myocardial infarction ([place]) 1990's X 1   "w/stroke"  . OSA (obstructive sleep apnea)   . SKIN LESION 01/24/2010  . Unspecified Peripheral Vascular Disease 06/24/2007   Past Surgical History:  Procedure Laterality Date  . ANTERIOR CERVICAL DECOMP/DISCECTOMY FUSION  1985   "job related injury"  . BACK SURGERY    . KNEE ARTHROSCOPY Bilateral 1990  . LIGAMENT REPAIR Right 1999   "wrist; job related injury"  . LUMBAR LAMINECTOMY  1990's    reports that he has been smoking cigars. He has smoked for the past 48.00 years. He has never used smokeless tobacco. He reports that he drinks alcohol. He reports that he does not use drugs. family history includes COPD in his father; Cancer in his mother; Heart attack in his brother; Heart disease in his brother, father, maternal grandfather, and maternal grandmother; Hypertension in his brother; Peripheral vascular disease in his brother. Allergies  Allergen Reactions  . Oxycodone Other (  See Comments)    Sedation/somnolence   Current Outpatient Medications on File Prior to Visit  Medication Sig Dispense Refill  . aspirin 81 MG tablet Take 81 mg by mouth daily.      . Blood Glucose Monitoring Suppl (ACCU-CHEK NANO SMARTVIEW) w/Device KIT use as directed to check insulin levels 1 kit 0  . Blood Glucose Monitoring Suppl DEVI 1 Device by Does not apply route daily. 1 each 0  . clonazePAM (KLONOPIN) 0.5 MG tablet TAKE 1-2 TABLET BY MOUTH AT BEDTIME, CAN TAKE UP TO 2 DEPENDING ON ANXIETY 60 tablet 5  . fenofibrate 160 MG tablet TAKE 1 TABLET (160 MG TOTAL) BY MOUTH DAILY. 30  tablet 11  . furosemide (LASIX) 20 MG tablet Take 0.5 tablets (10 mg total) by mouth daily. 30 tablet 5  . gabapentin (NEURONTIN) 100 MG capsule TAKE 2 CAPSULES BY MOUTH AT BEDTIME 60 capsule 11  . glucose blood test strip Use as instructed 100 each 12  . hydrochlorothiazide (HYDRODIURIL) 25 MG tablet Take 1 tablet (25 mg total) by mouth daily. 90 tablet 3  . labetalol (NORMODYNE) 300 MG tablet TAKE 1 TABLET (300 MG TOTAL) BY MOUTH 2 (TWO) TIMES DAILY. 60 tablet 11  . Lancets (ACCU-CHEK SOFT TOUCH) lancets Use as instructed 100 each 12  . metFORMIN (GLUCOPHAGE) 1000 MG tablet TAKE 1 TABLET (1,000 MG TOTAL) BY MOUTH 2 (TWO) TIMES DAILY WITH A MEAL. 60 tablet 11  . simvastatin (ZOCOR) 80 MG tablet TAKE 1 TABLET (80 MG TOTAL) BY MOUTH AT BEDTIME. 30 tablet 5   No current facility-administered medications on file prior to visit.     Review of Systems  Constitutional: Negative for other unusual diaphoresis or sweats HENT: Negative for ear discharge or swelling Eyes: Negative for other worsening visual disturbances Respiratory: Negative for stridor or other swelling  Gastrointestinal: Negative for worsening distension or other blood Genitourinary: Negative for retention or other urinary change Musculoskeletal: Negative for other MSK pain or swelling Skin: Negative for color change or other new lesions Neurological: Negative for worsening tremors and other numbness  Psychiatric/Behavioral: Negative for worsening agitation or other fatigue All other system neg per pt    Objective:   Physical Exam BP 138/90   Pulse 83   Temp 97.8 F (36.6 C) (Oral)   Ht 5' 11"  (1.803 m)   Wt 280 lb (127 kg)   SpO2 94%   BMI 39.05 kg/m  VS noted,  Constitutional: Pt appears in NAD HENT: Head: NCAT.  Right Ear: External ear normal.  Left Ear: External ear normal.  Eyes: . Pupils are equal, round, and reactive to light. Conjunctivae and EOM are normal Nose: without d/c or deformity Neck: Neck supple.  Gross normal ROM Cardiovascular: Normal rate and regular rhythm.   Pulmonary/Chest: Effort normal and breath sounds without rales or wheezing.  Abd:  Soft, NT, ND, + BS, no organomegaly Neurological: Pt is alert. At baseline orientation, motor grossly intact Skin: Skin is warm. No rashes, other new lesions, no LE edema Psychiatric: Pt behavior is normal without agitation  No other exam findings  Lab Results  Component Value Date   WBC 3.9 (L) 02/14/2018   HGB 14.8 02/14/2018   HCT 44.5 02/14/2018   PLT 67 (L) 02/14/2018   GLUCOSE 106 (H) 02/14/2018   CHOL 114 08/31/2017   TRIG 173.0 (H) 08/31/2017   HDL 33.10 (L) 08/31/2017   LDLDIRECT 75.0 06/22/2016   LDLCALC 46 08/31/2017   ALT 14 02/14/2018  AST 15 02/14/2018   NA 143 02/14/2018   K 3.8 02/14/2018   CL 108 02/14/2018   CREATININE 1.06 02/14/2018   BUN 16 02/14/2018   CO2 27 02/14/2018   TSH 2.007 12/26/2017   PSA 0.20 01/25/2017   INR 1.02 01/03/2018   HGBA1C 6.0 01/04/2018   MICROALBUR 3.1 (H) 01/25/2017   Declines further lab today except:  POCT A1c POCT glycosylated hemoglobin (Hb A1C)  Order: [id number]  Status:  Final result Visible to patient:  No (Not Released) Dx:  Type 2 diabetes mellitus with diabeti...   Ref Range & Units 15:37 (07/23/18) 39moago (01/04/18) 161mogo (08/31/17) 1y59yro (01/25/17) 9yr10yr (06/22/16) 9yr 75yr(12/21/15) 77yr a9yr6/8/16)  Hemoglobin A1C 4.0 - 5.6 % 5.9Abnormal   6.0 R 5.7 R, CM 6.1 R, CM 5.6 R, CM 6.1 R, CM 6.2High            Assessment & Plan:

## 2018-07-23 NOTE — Patient Instructions (Signed)
Your A1c was OK today  Please continue all other medications as before, and refills have been done if requested.  Please have the pharmacy call with any other refills you may need.  Please continue your efforts at being more active, low cholesterol diet, and weight control  Please keep your appointments with your specialists as you may have planned  Please return in 6 months, or sooner if needed, with Lab testing done 3-5 days before

## 2018-07-23 NOTE — Assessment & Plan Note (Signed)
stable overall by history and exam, recent data reviewed with pt, and pt to continue medical treatment as before,  to f/u any worsening symptoms or concerns  

## 2018-08-07 ENCOUNTER — Other Ambulatory Visit: Payer: Self-pay | Admitting: Internal Medicine

## 2018-10-04 ENCOUNTER — Other Ambulatory Visit: Payer: Self-pay | Admitting: Internal Medicine

## 2018-10-04 DIAGNOSIS — R609 Edema, unspecified: Secondary | ICD-10-CM

## 2018-11-12 ENCOUNTER — Other Ambulatory Visit: Payer: Self-pay | Admitting: Internal Medicine

## 2018-11-12 NOTE — Telephone Encounter (Signed)
Done erx 

## 2018-11-18 ENCOUNTER — Other Ambulatory Visit: Payer: Self-pay | Admitting: Internal Medicine

## 2019-01-11 ENCOUNTER — Other Ambulatory Visit: Payer: Self-pay | Admitting: Internal Medicine

## 2019-01-15 ENCOUNTER — Other Ambulatory Visit: Payer: Self-pay | Admitting: Internal Medicine

## 2019-01-24 ENCOUNTER — Encounter: Payer: Self-pay | Admitting: Internal Medicine

## 2019-01-24 ENCOUNTER — Telehealth: Payer: Self-pay | Admitting: Internal Medicine

## 2019-01-24 ENCOUNTER — Other Ambulatory Visit: Payer: Self-pay | Admitting: Internal Medicine

## 2019-01-24 ENCOUNTER — Ambulatory Visit (INDEPENDENT_AMBULATORY_CARE_PROVIDER_SITE_OTHER): Payer: Medicare Other | Admitting: Internal Medicine

## 2019-01-24 ENCOUNTER — Other Ambulatory Visit: Payer: Self-pay

## 2019-01-24 ENCOUNTER — Telehealth: Payer: Self-pay

## 2019-01-24 ENCOUNTER — Other Ambulatory Visit (INDEPENDENT_AMBULATORY_CARE_PROVIDER_SITE_OTHER): Payer: Medicare Other

## 2019-01-24 VITALS — BP 138/82 | HR 69 | Temp 97.8°F | Ht 71.0 in

## 2019-01-24 DIAGNOSIS — M170 Bilateral primary osteoarthritis of knee: Secondary | ICD-10-CM | POA: Insufficient documentation

## 2019-01-24 DIAGNOSIS — H538 Other visual disturbances: Secondary | ICD-10-CM | POA: Diagnosis not present

## 2019-01-24 DIAGNOSIS — E611 Iron deficiency: Secondary | ICD-10-CM

## 2019-01-24 DIAGNOSIS — I10 Essential (primary) hypertension: Secondary | ICD-10-CM

## 2019-01-24 DIAGNOSIS — Z0001 Encounter for general adult medical examination with abnormal findings: Secondary | ICD-10-CM

## 2019-01-24 DIAGNOSIS — R296 Repeated falls: Secondary | ICD-10-CM

## 2019-01-24 DIAGNOSIS — E559 Vitamin D deficiency, unspecified: Secondary | ICD-10-CM

## 2019-01-24 DIAGNOSIS — E1140 Type 2 diabetes mellitus with diabetic neuropathy, unspecified: Secondary | ICD-10-CM

## 2019-01-24 DIAGNOSIS — E538 Deficiency of other specified B group vitamins: Secondary | ICD-10-CM

## 2019-01-24 LAB — URINALYSIS, ROUTINE W REFLEX MICROSCOPIC
Bilirubin Urine: NEGATIVE
Hgb urine dipstick: NEGATIVE
Ketones, ur: NEGATIVE
Leukocytes,Ua: NEGATIVE
Nitrite: NEGATIVE
RBC / HPF: NONE SEEN (ref 0–?)
Specific Gravity, Urine: <=1.005 — AB (ref 1.000–1.030)
Total Protein, Urine: NEGATIVE
Urine Glucose: NEGATIVE
Urobilinogen, UA: 0.2 (ref 0.0–1.0)
WBC, UA: NONE SEEN (ref 0–?)
pH: 6 (ref 5.0–8.0)

## 2019-01-24 LAB — HEMOGLOBIN A1C: Hgb A1c MFr Bld: 6.2 % (ref 4.6–6.5)

## 2019-01-24 LAB — BASIC METABOLIC PANEL
BUN: 15 mg/dL (ref 6–23)
CO2: 28 mEq/L (ref 19–32)
Calcium: 9.8 mg/dL (ref 8.4–10.5)
Chloride: 104 mEq/L (ref 96–112)
Creatinine, Ser: 0.97 mg/dL (ref 0.40–1.50)
GFR: 76.76 mL/min (ref >60.00)
Glucose, Bld: 97 mg/dL (ref 70–99)
Potassium: 3.8 mEq/L (ref 3.5–5.1)
Sodium: 141 mEq/L (ref 135–145)

## 2019-01-24 LAB — CBC WITH DIFFERENTIAL/PLATELET
Basophils Absolute: 0 10*3/uL (ref 0.0–0.1)
Basophils Relative: 0.4 % (ref 0.0–3.0)
Eosinophils Absolute: 0.1 10*3/uL (ref 0.0–0.7)
Eosinophils Relative: 2.8 % (ref 0.0–5.0)
HCT: 43.3 % (ref 39.0–52.0)
Hemoglobin: 14.8 g/dL (ref 13.0–17.0)
Lymphocytes Relative: 26.9 % (ref 12.0–46.0)
Lymphs Abs: 1 10*3/uL (ref 0.7–4.0)
MCHC: 34.2 g/dL (ref 30.0–36.0)
MCV: 102.9 fl — ABNORMAL HIGH (ref 78.0–100.0)
Monocytes Absolute: 0.2 10*3/uL (ref 0.1–1.0)
Monocytes Relative: 6.1 % (ref 3.0–12.0)
Neutro Abs: 2.5 10*3/uL (ref 1.4–7.7)
Neutrophils Relative %: 63.8 % (ref 43.0–77.0)
Platelets: 50 10*3/uL — ABNORMAL LOW (ref 150.0–400.0)
RBC: 4.21 Mil/uL — ABNORMAL LOW (ref 4.22–5.81)
RDW: 13.3 % (ref 11.5–15.5)
WBC: 3.9 10*3/uL — ABNORMAL LOW (ref 4.0–10.5)

## 2019-01-24 LAB — HEPATIC FUNCTION PANEL
ALT: 11 U/L (ref 0–53)
AST: 16 U/L (ref 0–37)
Albumin: 4.5 g/dL (ref 3.5–5.2)
Alkaline Phosphatase: 34 U/L — ABNORMAL LOW (ref 39–117)
Bilirubin, Direct: 0.2 mg/dL (ref 0.0–0.3)
Total Bilirubin: 0.6 mg/dL (ref 0.2–1.2)
Total Protein: 6.6 g/dL (ref 6.0–8.3)

## 2019-01-24 LAB — LIPID PANEL
Cholesterol: 113 mg/dL (ref 0–200)
HDL: 30.6 mg/dL — ABNORMAL LOW (ref >39.00)
LDL Cholesterol: 49 mg/dL (ref 0–99)
NonHDL: 82.31
Total CHOL/HDL Ratio: 4
Triglycerides: 167 mg/dL — ABNORMAL HIGH (ref 0.0–149.0)
VLDL: 33.4 mg/dL (ref 0.0–40.0)

## 2019-01-24 LAB — IBC PANEL
Iron: 122 ug/dL (ref 42–165)
Saturation Ratios: 25.9 % (ref 20.0–50.0)
Transferrin: 337 mg/dL (ref 212.0–360.0)

## 2019-01-24 LAB — MICROALBUMIN / CREATININE URINE RATIO
Creatinine,U: 27.7 mg/dL
Microalb Creat Ratio: 10.9 mg/g (ref 0.0–30.0)
Microalb, Ur: 3 mg/dL — ABNORMAL HIGH (ref 0.0–1.9)

## 2019-01-24 LAB — VITAMIN B12: Vitamin B-12: 270 pg/mL (ref 211–911)

## 2019-01-24 LAB — TSH: TSH: 2.38 u[IU]/mL (ref 0.35–4.50)

## 2019-01-24 LAB — PSA: PSA: 0.19 ng/mL (ref 0.10–4.00)

## 2019-01-24 LAB — VITAMIN D 25 HYDROXY (VIT D DEFICIENCY, FRACTURES): VITD: 10.6 ng/mL — ABNORMAL LOW (ref 30.00–100.00)

## 2019-01-24 MED ORDER — DICLOFENAC SODIUM 1 % TD GEL: 2.0000 g | Freq: Four times a day (QID) | TRANSDERMAL | 11 refills | Status: DC

## 2019-01-24 MED ORDER — TRAMADOL HCL 50 MG PO TABS: 50.0000 mg | ORAL_TABLET | Freq: Four times a day (QID) | ORAL | 5 refills | Status: DC | PRN

## 2019-01-24 MED ORDER — VITAMIN D (ERGOCALCIFEROL) 1.25 MG (50000 UNIT) PO CAPS: 50000.0000 [IU] | ORAL_CAPSULE | ORAL | 0 refills | Status: AC

## 2019-01-24 NOTE — Telephone Encounter (Signed)
Pt called and stated that he did not receive VM and would like a call back. Please advise

## 2019-01-24 NOTE — Telephone Encounter (Signed)
Called pt, LVM.   

## 2019-01-24 NOTE — Patient Instructions (Signed)
Please take all new medication as prescribed - the tramadol for pain  You had the Pneumovax pneumonia shot today  Please continue all other medications as before, and refills have been done if requested.  Please have the pharmacy call with any other refills you may need.  Please continue your efforts at being more active, low cholesterol diet, and weight control.  You are otherwise up to date with prevention measures today.  Please keep your appointments with your specialists as you may have planned  You will be contacted regarding the referral for: Dr Katy Fitch for eye care, and Dr Tamala Julian for the knees  Please go to the LAB in the Basement (turn left off the elevator) for the tests to be done today  You will be contacted by phone if any changes need to be made immediately.  Otherwise, you will receive a letter about your results with an explanation, but please check with MyChart first.  Please remember to sign up for MyChart if you have not done so, as this will be important to you in the future with finding out test results, communicating by private email, and scheduling acute appointments online when needed.  Please return in 6 months, or sooner if needed, with Lab testing done 3-5 days before

## 2019-01-24 NOTE — Telephone Encounter (Signed)
-----   Message from Biagio Borg, MD sent at 01/24/2019  2:52 PM EDT ----- Letter sent, cont same tx except  The test results show that your current treatment is OK, except the Vitamin D level is low  Please take Vitamin D 50000 units weekly for 12 weeks, then plan to change to OTC Vitamin D3 at 2000 units per day, indefinitely.  Eric [name] to please inform pt, I will do rx

## 2019-01-24 NOTE — Telephone Encounter (Signed)
Pt stated that he can not afford that medication even as a generic. Please advise. Is it any alternative med that could be prescribed?

## 2019-01-24 NOTE — Telephone Encounter (Signed)
Patient returned call.  Gave MD response. He will try Voltaren Gel but does not wish to have a referral sent at this time.  Expressed understanding and did not have any further questions.

## 2019-01-24 NOTE — Telephone Encounter (Signed)
Very sorry, this medication is generic and there is no direct substitute that is appropriate

## 2019-01-24 NOTE — Progress Notes (Signed)
Subjective:    Patient ID: Eric [name], male    DOB: 09-19-1950, 68 y.o.   MRN: [id number]  HPI  Here for wellness and f/u;  Overall doing ok;  Pt denies Chest pain, worsening SOB, DOE, wheezing, orthopnea, PND, worsening LE edema, palpitations, dizziness or syncope.  Pt denies neurological change such as new headache, facial or extremity weakness. Pt states overall good compliance with treatment and medications, good tolerability, and has been trying to follow appropriate diet.  Pt denies worsening depressive symptoms, suicidal ideation or panic. No fever, night sweats, wt loss, loss of appetite, or other constitutional symptoms.  Pt states fair ability with ADL's, home safety reviewed and adequate, no other significant changes in hearing or vision, and not active with exercise.   Also - c/o 6 mo worsening bilateral dull and sharp knee pain with intermittent swelling with pain now 8/10, severely limiting ability to get around, has had several falls without injury, now using cane as walker does not seem to help more than this.  Has seen orthopedic about 2 yrs ago, trying to avoid surgury, cortisone did not seem to help, but did not have the gel shots.   Also Pt denies polydipsia, polyuria, or low sugar symptoms such as weakness or confusion improved with po intake.  Pt states overall good compliance with meds, trying to follow lower cholesterol, diabetic diet, wt overall down with better diet Wt Readings from Last 3 Encounters:  07/23/18 280 lb (127 kg)  02/14/18 299 lb 12.8 oz (136 kg)  01/16/18 (!) 304 lb 8 oz (138.1 kg)  Also c/o ? Worsening but persistent left eye vision worsening x 6 mo without pain, HA or right vision involvement Past Medical History:  Diagnosis Date  . ALLERGIC RHINITIS 06/24/2007  . 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 ANXIETY 06/24/2007  . Arthritis    "hands, knees, hips, neck; q part of the body" (01/27/2015)  . CELLULITIS AND ABSCESS OF LEG EXCEPT FOOT 03/28/2010  . Cellulitis of  left leg 01/27/2015  . CHF (congestive heart failure) ([place])   . Chronic lower back pain    "I have a steel rod in my back"  . Claudication ([place]) 06/02/2011  . COLONIC POLYPS, HX OF 06/24/2007  . Complication of anesthesia    "they can't put me asleep cause I don't wake up" (01/27/2015)  . CONSTIPATION, CHRONIC, HX OF 06/24/2007  . COPD (chronic obstructive pulmonary disease) ([place])   . Coronary artery disease   . CVA (cerebral vascular accident) [facility]) 1990's   "w/MI"; denies residual on 01/27/2015  . DEPRESSION 06/24/2007  . DIABETES MELLITUS, TYPE II dx'd 2015  . DIVERTICULOSIS, COLON 06/24/2007  . DVT (deep venous thrombosis) ([place])   . Heart murmur   . HYPERLIPIDEMIA 06/24/2007  . HYPERTENSION 06/24/2007  . INSOMNIA-SLEEP DISORDER-UNSPEC 01/24/2010  . Migraine    "comes over my right eye q now and then" (01/27/2015)  . Morbid obesity ([place]) 12/21/2015  . Myocardial infarction ([place]) 1990's X 1   "w/stroke"  . OSA (obstructive sleep apnea)   . SKIN LESION 01/24/2010  . Unspecified Peripheral Vascular Disease 06/24/2007   Past Surgical History:  Procedure Laterality Date  . ANTERIOR CERVICAL DECOMP/DISCECTOMY FUSION  1985   "job related injury"  . BACK SURGERY    . KNEE ARTHROSCOPY Bilateral 1990  . LIGAMENT REPAIR Right 1999   "wrist; job related injury"  . LUMBAR LAMINECTOMY  1990's    reports that he has been smoking cigars. He has  smoked for the past 48.00 years. He has never used smokeless tobacco. He reports current alcohol use. He reports that he does not use drugs. family history includes COPD in his father; Cancer in his mother; Heart attack in his brother; Heart disease in his brother, father, maternal grandfather, and maternal grandmother; Hypertension in his brother; Peripheral vascular disease in his brother. Allergies  Allergen Reactions  . Oxycodone Other (See Comments)    Sedation/somnolence   Current Outpatient Medications on File Prior to Visit  Medication Sig Dispense  Refill  . aspirin 81 MG tablet Take 81 mg by mouth daily.      . Blood Glucose Monitoring Suppl (ACCU-CHEK NANO SMARTVIEW) w/Device KIT use as directed to check insulin levels 1 kit 0  . Blood Glucose Monitoring Suppl DEVI 1 Device by Does not apply route daily. 1 each 0  . clonazePAM (KLONOPIN) 0.5 MG tablet TAKE 1-2 TABLET BY MOUTH AT BEDTIME, CAN TAKE UP TO 2 DEPENDING ON ANXIETY 60 tablet 2  . fenofibrate 160 MG tablet TAKE 1 TABLET (160 MG TOTAL) BY MOUTH DAILY. 30 tablet 11  . furosemide (LASIX) 20 MG tablet Take 0.5 tablets (10 mg total) by mouth daily. 30 tablet 5  . gabapentin (NEURONTIN) 100 MG capsule TAKE 2 CAPSULES BY MOUTH AT BEDTIME 60 capsule 11  . glucose blood test strip Use as instructed 100 each 12  . hydrochlorothiazide (HYDRODIURIL) 25 MG tablet TAKE 1 TABLET (25 MG TOTAL) BY MOUTH DAILY. 90 tablet 1  . labetalol (NORMODYNE) 300 MG tablet TAKE 1 TABLET BY MOUTH TWICE A DAY 180 tablet 0  . Lancets (ACCU-CHEK SOFT TOUCH) lancets Use as instructed 100 each 12  . metFORMIN (GLUCOPHAGE) 1000 MG tablet TAKE 1 TABLET (1,000 MG TOTAL) BY MOUTH 2 (TWO) TIMES DAILY WITH A MEAL. 180 tablet 0  . simvastatin (ZOCOR) 80 MG tablet TAKE 1 TABLET BY MOUTH EVERYDAY AT BEDTIME 90 tablet 1   No current facility-administered medications on file prior to visit.    Review of Systems Constitutional: Negative for other unusual diaphoresis, sweats, appetite or weight changes HENT: Negative for other worsening hearing loss, ear pain, facial swelling, mouth sores or neck stiffness.   Eyes: Negative for other worsening pain, redness or other visual disturbance.  Respiratory: Negative for other stridor or swelling Cardiovascular: Negative for other palpitations or other chest pain  Gastrointestinal: Negative for worsening diarrhea or loose stools, blood in stool, distention or other pain Genitourinary: Negative for hematuria, flank pain or other change in urine volume.  Musculoskeletal: Negative for  myalgias or other joint swelling.  Skin: Negative for other color change, or other wound or worsening drainage.  Neurological: Negative for other syncope or numbness. Hematological: Negative for other adenopathy or swelling Psychiatric/Behavioral: Negative for hallucinations, other worsening agitation, SI, self-injury, or new decreased concentration All other system neg per pt    Objective:   Physical Exam BP 138/82   Pulse 69   Temp 97.8 F (36.6 C) (Oral)   Ht _0  (1.803 m)   SpO2 94%   BMI 39.05 kg/m  VS noted, morbid obese Constitutional: Pt is oriented to person, place, and time. Appears well-developed and well-nourished, in no significant distress and comfortable Head: Normocephalic and atraumatic  Eyes: Conjunctivae and EOM are normal. Pupils are equal, round, and reactive to light Right Ear: External ear normal without discharge Left Ear: External ear normal without discharge Nose: Nose without discharge or deformity Mouth/Throat: Oropharynx is without other ulcerations and moist  Neck: Normal range of motion. Neck supple. No JVD present. No tracheal deviation present or significant neck LA or mass Cardiovascular: Normal rate, regular rhythm, normal heart sounds and intact distal pulses  Pulmonary/Chest: WOB normal and breath sounds without rales or wheezing  Abdominal: Soft. Bowel sounds are normal. NT. No HSM  Musculoskeletal: Normal range of motion. Exhibits chronic 1+ bilat LE edema, bilat knees with bony degenerative changes and right > left mild effusions Lymphadenopathy: Has no other cervical adenopathy.  Neurological: Pt is alert and oriented to person, place, and time. Pt has normal reflexes. No cranial nerve deficit. Motor grossly intact, Gait intact Skin: Skin is warm and dry. No rash noted or new ulcerations Psychiatric:  Has mildnervous mood and affect. Behavior is normal without agitation No other exam findings  Lab Results  Component Value Date   WBC  3.9 (L) 01/24/2019   HGB 14.8 01/24/2019   HCT 43.3 01/24/2019   PLT 50.0 Repeated and verified X2. (L) 01/24/2019   GLUCOSE 97 01/24/2019   CHOL 113 01/24/2019   TRIG 167.0 (H) 01/24/2019   HDL 30.60 (L) 01/24/2019   LDLDIRECT 75.0 06/22/2016   LDLCALC 49 01/24/2019   ALT 11 01/24/2019   AST 16 01/24/2019   NA 141 01/24/2019   K 3.8 01/24/2019   CL 104 01/24/2019   CREATININE 0.97 01/24/2019   BUN 15 01/24/2019   CO2 28 01/24/2019   TSH 2.38 01/24/2019   PSA 0.19 01/24/2019   INR 1.02 01/03/2018   HGBA1C 6.2 01/24/2019   MICROALBUR 3.0 (H) 01/24/2019       Assessment & Plan:

## 2019-01-24 NOTE — Telephone Encounter (Signed)
Copied from [place] [id number]. Topic: Quick Communication >> Jan 24, 2019 12:28 PM Blase Mess A wrote: CRM for notification. See Telephone encounter for: 01/24/19.   Patient's wife is calling regarding traMADol (ULTRAM) 50 MG tablet [[id number]] The patient's insurance does not cover this medication. Can another medication be sent to the pharmacy that is more affordable? Please advise [phone] (M) [id number] - [place], [place] [address] [phone] (Phone) [phone] (Fax)

## 2019-01-24 NOTE — Telephone Encounter (Signed)
Noted  

## 2019-01-24 NOTE — Addendum Note (Signed)
Addended by: Biagio Borg on: 01/24/2019 02:04 PM   Modules accepted: Orders

## 2019-01-24 NOTE — Telephone Encounter (Signed)
Ok for voltaren gel prn - done erx  I have nothing else to offer as I do not prescribe narcotics for chronic pain (schedule II and higher)  I can refer to pain clinic if he wants

## 2019-01-26 ENCOUNTER — Encounter: Payer: Self-pay | Admitting: Internal Medicine

## 2019-01-26 NOTE — Assessment & Plan Note (Addendum)
To continue cane use, consider using walker more often in the home, declines referral PT  In addition to the time spent performing CPE, I spent an additional 25 minutes face to face,in which greater than 50% of this time was spent in counseling and coordination of care for patient's acute illness as documented, including the differential dx, treatment, further evaluation and other management of recurrent falls, bilateral knee DJD, blurred vision left eye, HTN, DM,

## 2019-01-26 NOTE — Assessment & Plan Note (Signed)

## 2019-01-26 NOTE — Assessment & Plan Note (Signed)
stable overall by history and exam, recent data reviewed with pt, and pt to continue medical treatment as before,  to f/u any worsening symptoms or concerns  

## 2019-01-26 NOTE — Assessment & Plan Note (Signed)
Chronic persistent but possibly worsening, for referal optho

## 2019-01-26 NOTE — Assessment & Plan Note (Signed)
Mod to severe, for tramadol prn, for referal sport med prn,  to f/u any worsening symptoms or concerns

## 2019-01-30 ENCOUNTER — Other Ambulatory Visit: Payer: Self-pay | Admitting: Internal Medicine

## 2019-02-03 ENCOUNTER — Other Ambulatory Visit: Payer: Self-pay | Admitting: Internal Medicine

## 2019-02-08 NOTE — Progress Notes (Signed)
[name] [address], [place] [address] Phone: [phone] Subjective:   I, Kandace Blitz, am serving as a scribe for Dr. Hulan Saas.     CC: Bilateral knee pain  RXV:[id number]   09/2014: Patient has been doing very well and this is his third of for injections on the knee. Patient states he is feeling significantly better that he may not consider doing the fourth injection. He may want to wait. Patient otherwise will follow-up in one month to make sure he continues to improve. Patient continues to have some difficulty the only thing would be a possible knee replacement. Patient though is concerned because of his pulmonary disease if he is even a candidate. Patient will follow-up in one week and continue conservative therapy at this time.  Update 02/10/2019: Eric [name] is a 68 y.o. male coming in with complaint of bilateral knee pain. Patellar tendon pain that radiates into the lower leg. Swelling. Very painful with walking and getting in and out of chairs.  Patient as well as primary caregiver who notes that patient has difficulty with even regular daily activities at this point.  He cannot walk greater than 200 feet without significant amount of pain or feeling like he is going to fall.     Past Medical History:  Diagnosis Date  . ALLERGIC RHINITIS 06/24/2007  . 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 ANXIETY 06/24/2007  . Arthritis    "hands, knees, hips, neck; q part of the body" (01/27/2015)  . CELLULITIS AND ABSCESS OF LEG EXCEPT FOOT 03/28/2010  . Cellulitis of left leg 01/27/2015  . CHF (congestive heart failure) ([place])   . Chronic lower back pain    "I have a steel rod in my back"  . Claudication ([place]) 06/02/2011  . COLONIC POLYPS, HX OF 06/24/2007  . Complication of anesthesia    "they can't put me asleep cause I don't wake up" (01/27/2015)  . CONSTIPATION, CHRONIC, HX OF 06/24/2007  . COPD (chronic obstructive pulmonary disease) ([place])   . Coronary  artery disease   . CVA (cerebral vascular accident) [facility]) 1990's   "w/MI"; denies residual on 01/27/2015  . DEPRESSION 06/24/2007  . DIABETES MELLITUS, TYPE II dx'd 2015  . DIVERTICULOSIS, COLON 06/24/2007  . DVT (deep venous thrombosis) ([place])   . Heart murmur   . HYPERLIPIDEMIA 06/24/2007  . HYPERTENSION 06/24/2007  . INSOMNIA-SLEEP DISORDER-UNSPEC 01/24/2010  . Migraine    "comes over my right eye q now and then" (01/27/2015)  . Morbid obesity ([place]) 12/21/2015  . Myocardial infarction ([place]) 1990's X 1   "w/stroke"  . OSA (obstructive sleep apnea)   . SKIN LESION 01/24/2010  . Unspecified Peripheral Vascular Disease 06/24/2007   Past Surgical History:  Procedure Laterality Date  . ANTERIOR CERVICAL DECOMP/DISCECTOMY FUSION  1985   "job related injury"  . BACK SURGERY    . KNEE ARTHROSCOPY Bilateral 1990  . LIGAMENT REPAIR Right 1999   "wrist; job related injury"  . LUMBAR LAMINECTOMY  1990's   Social History   Socioeconomic History  . Marital status: Married    Spouse name: Not on file  . Number of children: 3  . Years of education: Not on file  . Highest education level: Not on file  Occupational History  . 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 Financial resource strain: Not on file  . Food insecurity    Worry: Not  on file    Inability: Not on file  . Transportation needs    Medical: Not on file    Non-medical: Not on file  Tobacco Use  . Smoking status: Current Every Day Smoker    Years: 48.00    Types: Cigars  . Smokeless tobacco: Never Used  . Tobacco comment: 01/27/2015 pt states he smokes 5 cigar cigarettes daily  Substance and Sexual Activity  . Alcohol use: Yes    Alcohol/week: 0.0 standard drinks    Comment: 01/27/2015 "might have a couple beers/yr"  . Drug use: No  . Sexual activity: Never  Lifestyle  . Physical activity    Days per week: Not on file    Minutes per session: Not on file  . Stress: Not on  file  Relationships  . Social Herbalist on phone: Not on file    Gets together: Not on file    Attends religious service: Not on file    Active member of club or organization: Not on file    Attends meetings of clubs or organizations: Not on file    Relationship status: Not on file  Other Topics Concern  . Not on file  Social History Narrative  . Not on file   Allergies  Allergen Reactions  . Oxycodone Other (See Comments)    Sedation/somnolence   Family History  Problem Relation Age of Onset  . COPD Father   . Heart disease Father   . Cancer Mother        lung cancer  . Heart disease Maternal Grandmother   . Heart disease Maternal Grandfather   . Heart disease Brother   . Hypertension Brother   . Heart attack Brother   . Peripheral vascular disease Brother     Current Outpatient Medications (Endocrine & Metabolic):  .  metFORMIN (GLUCOPHAGE) 1000 MG tablet, TAKE 1 TABLET (1,000 MG TOTAL) BY MOUTH 2 (TWO) TIMES DAILY WITH A MEAL.  Current Outpatient Medications (Cardiovascular):  .  fenofibrate 160 MG tablet, TAKE 1 TABLET BY MOUTH EVERY DAY .  furosemide (LASIX) 20 MG tablet, Take 0.5 tablets (10 mg total) by mouth daily. .  hydrochlorothiazide (HYDRODIURIL) 25 MG tablet, TAKE 1 TABLET (25 MG TOTAL) BY MOUTH DAILY. Marland Kitchen  labetalol (NORMODYNE) 300 MG tablet, TAKE 1 TABLET BY MOUTH TWICE A DAY .  simvastatin (ZOCOR) 80 MG tablet, TAKE 1 TABLET BY MOUTH EVERYDAY AT BEDTIME   Current Outpatient Medications (Analgesics):  .  aspirin 81 MG tablet, Take 81 mg by mouth daily.   .  traMADol (ULTRAM) 50 MG tablet, Take 1 tablet (50 mg total) by mouth every 6 (six) hours as needed.   Current Outpatient Medications (Other):  .  Blood Glucose Monitoring Suppl (ACCU-CHEK NANO SMARTVIEW) w/Device KIT, use as directed to check insulin levels .  Blood Glucose Monitoring Suppl DEVI, 1 Device by Does not apply route daily. .  clonazePAM (KLONOPIN) 0.5 MG tablet, TAKE 1-2  TABLET BY MOUTH AT BEDTIME, CAN TAKE UP TO 2 DEPENDING ON ANXIETY .  diclofenac sodium (VOLTAREN) 1 % GEL, Apply 2 g topically 4 (four) times daily. Marland Kitchen  gabapentin (NEURONTIN) 100 MG capsule, TAKE 2 CAPSULES BY MOUTH AT BEDTIME .  glucose blood test strip, Use as instructed .  Lancets (ACCU-CHEK SOFT TOUCH) lancets, Use as instructed .  Vitamin D, Ergocalciferol, (DRISDOL) 1.25 MG (50000 UT) CAPS capsule, Take 1 capsule (50,000 Units total) by mouth every 7 (seven) days.    Past  medical history, social, surgical and family history all reviewed in electronic medical record.  No pertanent information unless stated regarding to the chief complaint.   Review of Systems:  No headache, visual changes, nausea, vomiting, diarrhea, constipation, dizziness, abdominal pain, skin rash, fevers, chills, night sweats, weight loss, swollen lymph nodes, chest pain, shortness of breath, mood changes.  Positive joint swelling, body aches muscle aches   Objective  Blood pressure (!) 150/96, pulse 80, height 5' 11"  (1.803 m), weight 280 lb (127 kg), SpO2 97 %.     General: No apparent distress alert and oriented x2 mood and affect normal, dressed appropriately.  HEENT: Pupils equal, extraocular movements intact  Respiratory: Patient's speak in full sentences mild shortness of breath at baseline Cardiovascular: 2+ lower extremity edema, mild erythema with hemosiderin deposits  Abdomen: Soft nontender  Neuro: Cranial nerves II through XII are intact, neurovascularly intact in all extremities with 2+ DTRs and 2+ pulses.  Lymph: No lymphadenopathy of posterior or anterior cervical chain or axillae bilaterally.  Gait walks with the aid of a cane MSK: Severe arthritic changes of multiple joints Knee: Bilateral valgus deformity noted. Large thigh to calf ratio.  Tender to palpation over medial and PF joint line.  Limited range of motion in all planes instability with valgus force.  painful patellar compression.  Patellar glide with moderate crepitus. Patellar and quadriceps tendons unremarkable. Hamstring and quadriceps strength is normal.   After informed written and verbal consent, patient was seated on exam table. Right knee was prepped with alcohol swab and utilizing anterolateral approach, patient's right knee space was injected with 4:1  marcaine 0.5%: Kenalog 75m/dL. Patient tolerated the procedure well without immediate complications.  After informed written and verbal consent, patient was seated on exam table. Left knee was prepped with alcohol swab and utilizing anterolateral approach, patient's left knee space was injected with 4:1  marcaine 0.5%: Kenalog 422mdL. Patient tolerated the procedure well without immediate complications.   Impression and Recommendations:     This case required medical decision making of moderate complexity. The above documentation has been reviewed and is accurate and complete ZaLyndal PulleyDO       Note: This dictation was prepared with Dragon dictation along with smaller phrase technology. Any transcriptional errors that result from this process are unintentional.

## 2019-02-10 ENCOUNTER — Encounter: Payer: Self-pay | Admitting: Family Medicine

## 2019-02-10 ENCOUNTER — Other Ambulatory Visit: Payer: Self-pay

## 2019-02-10 ENCOUNTER — Ambulatory Visit: Payer: Medicare Other | Admitting: Family Medicine

## 2019-02-10 VITALS — BP 150/96 | HR 80 | Ht 71.0 in | Wt 280.0 lb

## 2019-02-10 DIAGNOSIS — I739 Peripheral vascular disease, unspecified: Secondary | ICD-10-CM | POA: Diagnosis not present

## 2019-02-10 DIAGNOSIS — M170 Bilateral primary osteoarthritis of knee: Secondary | ICD-10-CM | POA: Diagnosis not present

## 2019-02-10 NOTE — Patient Instructions (Addendum)
Good to see you.  See me again in 3-4 weeks

## 2019-02-10 NOTE — Assessment & Plan Note (Signed)
Patient has significant different comorbidities that I think are contributing to a lot of his pain.  Patient does have some claudication.  No signs of any infectious etiology.  Severe arthritic change of the knee with instability.  Bilateral injections given today.  We discussed that he needs to monitor blood sugars very closely.  We discussed icing regimen and home exercises.  Patient will consider the possibility of a custom brace for the right knee secondary to the instability and the abnormal thigh to calf ratio.  Patient will follow-up with me again in 4 to 6 weeks and could be a candidate for Visco supplementation

## 2019-02-11 ENCOUNTER — Other Ambulatory Visit: Payer: Self-pay | Admitting: Internal Medicine

## 2019-02-19 ENCOUNTER — Other Ambulatory Visit: Payer: Self-pay | Admitting: Internal Medicine

## 2019-02-19 NOTE — Telephone Encounter (Signed)
Done erx 

## 2019-03-08 ENCOUNTER — Other Ambulatory Visit: Payer: Self-pay | Admitting: Internal Medicine

## 2019-03-10 ENCOUNTER — Other Ambulatory Visit: Payer: Self-pay

## 2019-03-10 ENCOUNTER — Encounter: Payer: Self-pay | Admitting: Family Medicine

## 2019-03-10 ENCOUNTER — Ambulatory Visit (INDEPENDENT_AMBULATORY_CARE_PROVIDER_SITE_OTHER): Payer: Medicare Other | Admitting: Family Medicine

## 2019-03-10 DIAGNOSIS — M170 Bilateral primary osteoarthritis of knee: Secondary | ICD-10-CM

## 2019-03-10 MED ORDER — TRAZODONE HCL 50 MG PO TABS: 25.0000 mg | ORAL_TABLET | Freq: Every evening | ORAL | 3 refills | Status: DC | PRN

## 2019-03-10 NOTE — Patient Instructions (Signed)
See me again in 6 weeks Injected both knees w Durolane today Trazadone 1/2 of a pill at night

## 2019-03-10 NOTE — Progress Notes (Signed)
[name] [address] [place], [place] [address] Phone: [phone] Subjective:   Eric [name], am serving as a scribe for Dr. Hulan Saas.   CC: Bilateral knee pain  [id number]   02/10/2019 :  Patient has significant different comorbidities that I think are contributing to a lot of his pain.  Patient does have some claudication.  [name] signs of any infectious etiology.  Severe arthritic change of the knee with instability.  Bilateral injections given today.  We discussed that he needs to monitor blood sugars very closely.  We discussed icing regimen and home exercises.  Patient will consider the possibility of a custom brace for the right knee secondary to the instability and the abnormal thigh to calf ratio.  Patient will follow-up with me again in 4 to 6 weeks and could be a candidate for Visco supplementation  Update 03/10/2019: Eric [name] is a 68 y.o. male coming in with complaint of bilateral knee pain. Did not have any relief from the last injections. Right knee is worse than left today. Constant knee pain. Patient has had a severe arthritis.  Patient states increasing instability.  Has difficulty getting up and down secondary to the pain.    Past Medical History:  Diagnosis Date  . ALLERGIC RHINITIS 06/24/2007  . 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 ANXIETY 06/24/2007  . Arthritis    "hands, knees, hips, neck; q part of the body" (01/27/2015)  . CELLULITIS AND ABSCESS OF LEG EXCEPT FOOT 03/28/2010  . Cellulitis of left leg 01/27/2015  . CHF (congestive heart failure) ([place])   . Chronic lower back pain    "I have a steel rod in my back"  . Claudication ([place]) 06/02/2011  . COLONIC POLYPS, HX OF 06/24/2007  . Complication of anesthesia    "they can't put me asleep cause I don't wake up" (01/27/2015)  . CONSTIPATION, CHRONIC, HX OF 06/24/2007  . COPD (chronic obstructive pulmonary disease) ([place])   . Coronary artery disease   . CVA (cerebral vascular  accident) [facility]) 1990's   "w/MI"; denies residual on 01/27/2015  . DEPRESSION 06/24/2007  . DIABETES MELLITUS, TYPE II dx'd 2015  . DIVERTICULOSIS, COLON 06/24/2007  . DVT (deep venous thrombosis) ([place])   . Heart murmur   . HYPERLIPIDEMIA 06/24/2007  . HYPERTENSION 06/24/2007  . INSOMNIA-SLEEP DISORDER-UNSPEC 01/24/2010  . Migraine    "comes over my right eye q now and then" (01/27/2015)  . Morbid obesity ([place]) 12/21/2015  . Myocardial infarction ([place]) 1990's X 1   "w/stroke"  . OSA (obstructive sleep apnea)   . SKIN LESION 01/24/2010  . Unspecified Peripheral Vascular Disease 06/24/2007   Past Surgical History:  Procedure Laterality Date  . ANTERIOR CERVICAL DECOMP/DISCECTOMY FUSION  1985   "job related injury"  . BACK SURGERY    . KNEE ARTHROSCOPY Bilateral 1990  . LIGAMENT REPAIR Right 1999   "wrist; job related injury"  . LUMBAR LAMINECTOMY  1990's   Social History   Socioeconomic History  . Marital status: Married    Spouse name: Not on file  . Number of children: 3  . Years of education: Not on file  . Highest education level: Not on file  Occupational History  . 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 Financial resource strain: Not on file  . Food insecurity    Worry: Not on file    Inability: Not on file  .  Transportation needs    Medical: Not on file    Non-medical: Not on file  Tobacco Use  . Smoking status: Current Every Day Smoker    Years: 48.00    Types: Cigars  . Smokeless tobacco: Never Used  . Tobacco comment: 01/27/2015 pt states he smokes 5 cigar cigarettes daily  Substance and Sexual Activity  . Alcohol use: Yes    Alcohol/week: 0.0 standard drinks    Comment: 01/27/2015 "might have a couple beers/yr"  . Drug use: [name]  . Sexual activity: Never  Lifestyle  . Physical activity    Days per week: Not on file    Minutes per session: Not on file  . Stress: Not on file  Relationships  . Social Product manager on phone: Not on file    Gets together: Not on file    Attends religious service: Not on file    Active member of club or organization: Not on file    Attends meetings of clubs or organizations: Not on file    Relationship status: Not on file  Other Topics Concern  . Not on file  Social History Narrative  . Not on file   Allergies  Allergen Reactions  . Oxycodone Other (See Comments)    Sedation/somnolence   Family History  Problem Relation Age of Onset  . COPD Father   . Heart disease Father   . Cancer Mother        lung cancer  . Heart disease Maternal Grandmother   . Heart disease Maternal Grandfather   . Heart disease Brother   . Hypertension Brother   . Heart attack Brother   . Peripheral vascular disease Brother     Current Outpatient Medications (Endocrine & Metabolic):  .  metFORMIN (GLUCOPHAGE) 1000 MG tablet, TAKE 1 TABLET (1,000 MG TOTAL) BY MOUTH 2 (TWO) TIMES DAILY WITH A MEAL.  Current Outpatient Medications (Cardiovascular):  .  fenofibrate 160 MG tablet, TAKE 1 TABLET BY MOUTH EVERY DAY .  furosemide (LASIX) 20 MG tablet, Take 0.5 tablets (10 mg total) by mouth daily. .  hydrochlorothiazide (HYDRODIURIL) 25 MG tablet, TAKE 1 TABLET (25 MG TOTAL) BY MOUTH DAILY. Marland Kitchen  labetalol (NORMODYNE) 300 MG tablet, TAKE 1 TABLET BY MOUTH TWICE A DAY .  simvastatin (ZOCOR) 80 MG tablet, TAKE 1 TABLET BY MOUTH EVERYDAY AT BEDTIME   Current Outpatient Medications (Analgesics):  .  aspirin 81 MG tablet, Take 81 mg by mouth daily.   .  traMADol (ULTRAM) 50 MG tablet, Take 1 tablet (50 mg total) by mouth every 6 (six) hours as needed.   Current Outpatient Medications (Other):  .  Blood Glucose Monitoring Suppl (ACCU-CHEK NANO SMARTVIEW) w/Device KIT, use as directed to check insulin levels .  Blood Glucose Monitoring Suppl DEVI, 1 Device by Does not apply route daily. .  clonazePAM (KLONOPIN) 0.5 MG tablet, TAKE 1-2 TABLET BY MOUTH AT BEDTIME, CAN TAKE UP TO 2  DEPENDING ON ANXIETY .  diclofenac sodium (VOLTAREN) 1 % GEL, Apply 2 g topically 4 (four) times daily. Marland Kitchen  gabapentin (NEURONTIN) 100 MG capsule, TAKE 2 CAPSULES BY MOUTH EVERY DAY AT BEDTIME .  glucose blood test strip, Use as instructed .  Lancets (ACCU-CHEK SOFT TOUCH) lancets, Use as instructed .  Vitamin D, Ergocalciferol, (DRISDOL) 1.25 MG (50000 UT) CAPS capsule, Take 1 capsule (50,000 Units total) by mouth every 7 (seven) days. .  traZODone (DESYREL) 50 MG tablet, Take 0.5-1 tablets (25-50 mg total)  by mouth at bedtime as needed for sleep.    Past medical history, social, surgical and family history all reviewed in electronic medical record.  [name] pertanent information unless stated regarding to the chief complaint.   Review of Systems:  [name] headache, visual changes, nausea, vomiting, diarrhea, constipation, dizziness, abdominal pain, skin rash, fevers, chills, night sweats, weight loss, swollen lymph nodes, body aches, joint swelling, muscle aches, chest pain, shortness of breath, mood changes.   Objective  Blood pressure 138/62, pulse 72, height 5' 11"  (1.803 m), SpO2 95 %.    General: [name] apparent distress alert  mood and affect normal, dressed appropriately.  HEENT: Pupils equal, extraocular movements intact  Respiratory: Patient's speak in full sentences and does not appear short of breath  Cardiovascular: 2+ lower extremity edema, non tender, [name] erythema  Skin: Warm dry intact with [name] signs of infection or rash on extremities or on axial skeleton.  Hemosiderin deposits of the lower extremities noted Abdomen: Soft nontender  Neuro: Cranial nerves II through XII are intact, neurovascularly intact in all extremities with 2+ DTRs and 2+ pulses.  Lymph: [name] lymphadenopathy of posterior or anterior cervical chain or axillae bilaterally.  Gait antalgic mostly sitting in a wheelchair though today MSK:   Knee: Bilateral valgus deformity noted. Large thigh to calf ratio.  Tender to  palpation over medial and PF joint line.  ROM full in flexion and extension and lower leg rotation. instability with valgus force.  painful patellar compression. Patellar glide with moderate crepitus. Patellar and quadriceps tendons unremarkable. Hamstring and quadriceps strength is symmetric  After informed written and verbal consent, patient was seated on exam table. Right knee was prepped with alcohol swab and utilizing anterolateral approach, patient's right knee space was injected with 61m/3mL of Durolene (sodium hyaluronate) in a prefilled syringe was injected easily into the knee through a 22-gauge needle..Patient tolerated the procedure well without immediate complications.  After informed written and verbal consent, patient was seated on exam table. Left knee was prepped with alcohol swab and utilizing anterolateral approach, patient's left knee space was injected with60 mg/ 33 mL of Durolene (sodium hyaluronate) in a prefilled syringe was injected easily into the knee through a 22-gauge needle..Patient tolerated the procedure well without immediate complications.    Impression and Recommendations:     This case required medical decision making of moderate complexity. The above documentation has been reviewed and is accurate and complete ZLyndal Pulley DO       Note: This dictation was prepared with Dragon dictation along with smaller phrase technology. Any transcriptional errors that result from this process are unintentional.

## 2019-03-10 NOTE — Assessment & Plan Note (Signed)
Patient given viscosupplementation.  Discussed icing regimen and home exercise.  Discussed which activities to do which wants to avoid.  Discussed posture and ergonomics.  Discussed using the cane and then patient has been fitted for custom orthotics secondary to the instability as well as the abnormal thigh to calf ratio.  Patient does have dementia at baseline.  Follow-up again in 4 to 8 weeks

## 2019-03-23 ENCOUNTER — Other Ambulatory Visit: Payer: Self-pay | Admitting: Internal Medicine

## 2019-03-23 DIAGNOSIS — R609 Edema, unspecified: Secondary | ICD-10-CM

## 2019-04-01 ENCOUNTER — Other Ambulatory Visit: Payer: Self-pay | Admitting: Family Medicine

## 2019-04-13 ENCOUNTER — Other Ambulatory Visit: Payer: Self-pay | Admitting: Internal Medicine

## 2019-04-22 ENCOUNTER — Other Ambulatory Visit: Payer: Self-pay | Admitting: Internal Medicine

## 2019-05-08 ENCOUNTER — Other Ambulatory Visit: Payer: Self-pay

## 2019-05-08 ENCOUNTER — Encounter (HOSPITAL_COMMUNITY): Payer: Self-pay | Admitting: Pharmacy Technician

## 2019-05-08 ENCOUNTER — Inpatient Hospital Stay (HOSPITAL_COMMUNITY)
Admission: EM | Admit: 2019-05-08 | Discharge: 2019-05-08 | DRG: 299 | Discharge status: Against Medical Advice | Payer: Medicare Other | Attending: Internal Medicine | Admitting: Internal Medicine

## 2019-05-08 ENCOUNTER — Emergency Department (HOSPITAL_COMMUNITY): Payer: Medicare Other

## 2019-05-08 ENCOUNTER — Telehealth: Payer: Self-pay

## 2019-05-08 DIAGNOSIS — G8929 Other chronic pain: Secondary | ICD-10-CM | POA: Diagnosis present

## 2019-05-08 DIAGNOSIS — I2510 Atherosclerotic heart disease of native coronary artery without angina pectoris: Secondary | ICD-10-CM | POA: Diagnosis present

## 2019-05-08 DIAGNOSIS — R0602 Shortness of breath: Secondary | ICD-10-CM | POA: Diagnosis not present

## 2019-05-08 DIAGNOSIS — R609 Edema, unspecified: Secondary | ICD-10-CM | POA: Diagnosis not present

## 2019-05-08 DIAGNOSIS — I10 Essential (primary) hypertension: Secondary | ICD-10-CM | POA: Diagnosis not present

## 2019-05-08 DIAGNOSIS — N281 Cyst of kidney, acquired: Secondary | ICD-10-CM | POA: Diagnosis not present

## 2019-05-08 DIAGNOSIS — Z7984 Long term (current) use of oral hypoglycemic drugs: Secondary | ICD-10-CM

## 2019-05-08 DIAGNOSIS — F419 Anxiety disorder, unspecified: Secondary | ICD-10-CM | POA: Diagnosis present

## 2019-05-08 DIAGNOSIS — M545 Low back pain: Secondary | ICD-10-CM | POA: Diagnosis present

## 2019-05-08 DIAGNOSIS — J449 Chronic obstructive pulmonary disease, unspecified: Secondary | ICD-10-CM | POA: Diagnosis present

## 2019-05-08 DIAGNOSIS — Y9301 Activity, walking, marching and hiking: Secondary | ICD-10-CM | POA: Diagnosis present

## 2019-05-08 DIAGNOSIS — J189 Pneumonia, unspecified organism: Secondary | ICD-10-CM | POA: Diagnosis not present

## 2019-05-08 DIAGNOSIS — E1142 Type 2 diabetes mellitus with diabetic polyneuropathy: Secondary | ICD-10-CM | POA: Diagnosis not present

## 2019-05-08 DIAGNOSIS — E669 Obesity, unspecified: Secondary | ICD-10-CM | POA: Diagnosis present

## 2019-05-08 DIAGNOSIS — Z66 Do not resuscitate: Secondary | ICD-10-CM | POA: Diagnosis not present

## 2019-05-08 DIAGNOSIS — Z79899 Other long term (current) drug therapy: Secondary | ICD-10-CM

## 2019-05-08 DIAGNOSIS — I110 Hypertensive heart disease with heart failure: Secondary | ICD-10-CM | POA: Diagnosis not present

## 2019-05-08 DIAGNOSIS — J9859 Other diseases of mediastinum, not elsewhere classified: Secondary | ICD-10-CM | POA: Diagnosis present

## 2019-05-08 DIAGNOSIS — I252 Old myocardial infarction: Secondary | ICD-10-CM

## 2019-05-08 DIAGNOSIS — Z8673 Personal history of transient ischemic attack (TIA), and cerebral infarction without residual deficits: Secondary | ICD-10-CM

## 2019-05-08 DIAGNOSIS — Z5329 Procedure and treatment not carried out because of patient's decision for other reasons: Secondary | ICD-10-CM | POA: Diagnosis not present

## 2019-05-08 DIAGNOSIS — Z825 Family history of asthma and other chronic lower respiratory diseases: Secondary | ICD-10-CM

## 2019-05-08 DIAGNOSIS — Z8249 Family history of ischemic heart disease and other diseases of the circulatory system: Secondary | ICD-10-CM

## 2019-05-08 DIAGNOSIS — K5909 Other constipation: Secondary | ICD-10-CM | POA: Diagnosis not present

## 2019-05-08 DIAGNOSIS — E785 Hyperlipidemia, unspecified: Secondary | ICD-10-CM | POA: Diagnosis not present

## 2019-05-08 DIAGNOSIS — S61412A Laceration without foreign body of left hand, initial encounter: Secondary | ICD-10-CM | POA: Diagnosis not present

## 2019-05-08 DIAGNOSIS — F17290 Nicotine dependence, other tobacco product, uncomplicated: Secondary | ICD-10-CM | POA: Diagnosis present

## 2019-05-08 DIAGNOSIS — Z20828 Contact with and (suspected) exposure to other viral communicable diseases: Secondary | ICD-10-CM | POA: Diagnosis present

## 2019-05-08 DIAGNOSIS — R05 Cough: Secondary | ICD-10-CM | POA: Diagnosis not present

## 2019-05-08 DIAGNOSIS — J9601 Acute respiratory failure with hypoxia: Secondary | ICD-10-CM | POA: Diagnosis present

## 2019-05-08 DIAGNOSIS — G4733 Obstructive sleep apnea (adult) (pediatric): Secondary | ICD-10-CM | POA: Diagnosis present

## 2019-05-08 DIAGNOSIS — I509 Heart failure, unspecified: Secondary | ICD-10-CM | POA: Diagnosis not present

## 2019-05-08 DIAGNOSIS — I82210 Acute embolism and thrombosis of superior vena cava: Secondary | ICD-10-CM | POA: Diagnosis not present

## 2019-05-08 DIAGNOSIS — F1010 Alcohol abuse, uncomplicated: Secondary | ICD-10-CM | POA: Diagnosis present

## 2019-05-08 DIAGNOSIS — R0609 Other forms of dyspnea: Secondary | ICD-10-CM

## 2019-05-08 DIAGNOSIS — E1151 Type 2 diabetes mellitus with diabetic peripheral angiopathy without gangrene: Secondary | ICD-10-CM | POA: Diagnosis present

## 2019-05-08 DIAGNOSIS — S59912A Unspecified injury of left forearm, initial encounter: Secondary | ICD-10-CM | POA: Diagnosis not present

## 2019-05-08 DIAGNOSIS — R222 Localized swelling, mass and lump, trunk: Secondary | ICD-10-CM | POA: Diagnosis present

## 2019-05-08 DIAGNOSIS — Z801 Family history of malignant neoplasm of trachea, bronchus and lung: Secondary | ICD-10-CM

## 2019-05-08 DIAGNOSIS — E876 Hypokalemia: Secondary | ICD-10-CM | POA: Diagnosis present

## 2019-05-08 DIAGNOSIS — W1830XA Fall on same level, unspecified, initial encounter: Secondary | ICD-10-CM | POA: Diagnosis present

## 2019-05-08 DIAGNOSIS — R7989 Other specified abnormal findings of blood chemistry: Secondary | ICD-10-CM | POA: Diagnosis not present

## 2019-05-08 DIAGNOSIS — W19XXXA Unspecified fall, initial encounter: Secondary | ICD-10-CM | POA: Diagnosis not present

## 2019-05-08 DIAGNOSIS — M79632 Pain in left forearm: Secondary | ICD-10-CM | POA: Diagnosis not present

## 2019-05-08 DIAGNOSIS — R51 Headache: Secondary | ICD-10-CM | POA: Diagnosis not present

## 2019-05-08 DIAGNOSIS — R079 Chest pain, unspecified: Secondary | ICD-10-CM | POA: Diagnosis not present

## 2019-05-08 DIAGNOSIS — Z6833 Body mass index (BMI) 33.0-33.9, adult: Secondary | ICD-10-CM | POA: Diagnosis not present

## 2019-05-08 DIAGNOSIS — E278 Other specified disorders of adrenal gland: Secondary | ICD-10-CM | POA: Diagnosis not present

## 2019-05-08 DIAGNOSIS — Z9181 History of falling: Secondary | ICD-10-CM

## 2019-05-08 DIAGNOSIS — G4700 Insomnia, unspecified: Secondary | ICD-10-CM | POA: Diagnosis not present

## 2019-05-08 DIAGNOSIS — Z981 Arthrodesis status: Secondary | ICD-10-CM

## 2019-05-08 DIAGNOSIS — R918 Other nonspecific abnormal finding of lung field: Secondary | ICD-10-CM | POA: Diagnosis not present

## 2019-05-08 LAB — CBC
HCT: 44.7 % (ref 39.0–52.0)
Hemoglobin: 14.4 g/dL (ref 13.0–17.0)
MCH: 34.8 pg — ABNORMAL HIGH (ref 26.0–34.0)
MCHC: 32.2 g/dL (ref 30.0–36.0)
MCV: 108 fL — ABNORMAL HIGH (ref 80.0–100.0)
Platelets: 46 10*3/uL — ABNORMAL LOW (ref 150–400)
RBC: 4.14 MIL/uL — ABNORMAL LOW (ref 4.22–5.81)
RDW: 14.9 % (ref 11.5–15.5)
WBC: 4.3 10*3/uL (ref 4.0–10.5)
nRBC: 0 % (ref 0.0–0.2)

## 2019-05-08 LAB — BASIC METABOLIC PANEL
Anion gap: 12 (ref 5–15)
BUN: 22 mg/dL (ref 8–23)
CO2: 30 mmol/L (ref 22–32)
Calcium: 9.2 mg/dL (ref 8.9–10.3)
Chloride: 102 mmol/L (ref 98–111)
Creatinine, Ser: 1.11 mg/dL (ref 0.61–1.24)
GFR calc Af Amer: >60 mL/min (ref >60)
GFR calc non Af Amer: >60 mL/min (ref >60)
Glucose, Bld: 119 mg/dL — ABNORMAL HIGH (ref 70–99)
Potassium: 2.6 mmol/L — CL (ref 3.5–5.1)
Sodium: 144 mmol/L (ref 135–145)

## 2019-05-08 LAB — TROPONIN I (HIGH SENSITIVITY)
Troponin I (High Sensitivity): 31 ng/L — ABNORMAL HIGH (ref <18)
Troponin I (High Sensitivity): 32 ng/L — ABNORMAL HIGH (ref <18)
Troponin I (High Sensitivity): 31 ng/L — ABNORMAL HIGH (ref <18)

## 2019-05-08 LAB — BRAIN NATRIURETIC PEPTIDE: B Natriuretic Peptide: 422 pg/mL — ABNORMAL HIGH (ref 0.0–100.0)

## 2019-05-08 LAB — LACTIC ACID, PLASMA
Lactic Acid, Venous: 1.8 mmol/L (ref 0.5–1.9)
Lactic Acid, Venous: 1.4 mmol/L (ref 0.5–1.9)

## 2019-05-08 LAB — SARS CORONAVIRUS 2 (TAT 6-24 HRS): SARS Coronavirus 2: NEGATIVE

## 2019-05-08 LAB — MAGNESIUM: Magnesium: 2 mg/dL (ref 1.7–2.4)

## 2019-05-08 MED ORDER — IPRATROPIUM-ALBUTEROL 0.5-2.5 (3) MG/3ML IN SOLN: 3.0000 mL | Freq: Four times a day (QID) | RESPIRATORY_TRACT | Status: DC

## 2019-05-08 MED ORDER — POTASSIUM CHLORIDE CRYS ER 20 MEQ PO TBCR: 40.0000 meq | EXTENDED_RELEASE_TABLET | Freq: Two times a day (BID) | ORAL | Status: DC

## 2019-05-08 MED ORDER — FENOFIBRATE 160 MG PO TABS: 160.0000 mg | ORAL_TABLET | Freq: Every day | ORAL | Status: DC

## 2019-05-08 MED ORDER — INSULIN ASPART 100 UNIT/ML ~~LOC~~ SOLN: 0.0000 [IU] | Freq: Three times a day (TID) | SUBCUTANEOUS | Status: DC

## 2019-05-08 MED ORDER — DM-GUAIFENESIN ER 30-600 MG PO TB12: 2.0000 | ORAL_TABLET | Freq: Two times a day (BID) | ORAL | Status: DC

## 2019-05-08 MED ORDER — MORPHINE SULFATE (PF) 2 MG/ML IV SOLN
1.0000 mg | INTRAVENOUS | Status: DC | PRN
  Administered 2019-05-08: 20:00:00 1 mg via INTRAVENOUS
  Filled 2019-05-08: qty 1

## 2019-05-08 MED ORDER — IOHEXOL 300 MG/ML  SOLN
100.0000 mL | Freq: Once | INTRAMUSCULAR | Status: AC | PRN
  Administered 2019-05-08: 100 mL via INTRAVENOUS

## 2019-05-08 MED ORDER — PIPERACILLIN-TAZOBACTAM 3.375 G IVPB: 3.3750 g | Freq: Three times a day (TID) | INTRAVENOUS | Status: DC

## 2019-05-08 MED ORDER — GABAPENTIN 100 MG PO CAPS: 200.0000 mg | ORAL_CAPSULE | Freq: Every day | ORAL | Status: DC

## 2019-05-08 MED ORDER — HYDROCOD POLST-CPM POLST ER 10-8 MG/5ML PO SUER: 5.0000 mL | Freq: Two times a day (BID) | ORAL | Status: DC | PRN

## 2019-05-08 MED ORDER — PIPERACILLIN-TAZOBACTAM 3.375 G IVPB 30 MIN: 3.3750 g | Freq: Three times a day (TID) | INTRAVENOUS | Status: DC

## 2019-05-08 MED ORDER — FOLIC ACID 1 MG PO TABS: 1.0000 mg | ORAL_TABLET | Freq: Every day | ORAL | Status: DC

## 2019-05-08 MED ORDER — ADULT MULTIVITAMIN W/MINERALS CH: 1.0000 | ORAL_TABLET | Freq: Every day | ORAL | Status: DC

## 2019-05-08 MED ORDER — PIPERACILLIN-TAZOBACTAM 3.375 G IVPB 30 MIN
3.3750 g | Freq: Once | INTRAVENOUS | Status: AC
  Administered 2019-05-08: 3.375 g via INTRAVENOUS
  Filled 2019-05-08: qty 50

## 2019-05-08 MED ORDER — VITAMIN B-1 100 MG PO TABS: 100.0000 mg | ORAL_TABLET | Freq: Every day | ORAL | Status: DC

## 2019-05-08 MED ORDER — INSULIN ASPART 100 UNIT/ML ~~LOC~~ SOLN: 0.0000 [IU] | Freq: Every day | SUBCUTANEOUS | Status: DC

## 2019-05-08 MED ORDER — NICOTINE 14 MG/24HR TD PT24: 14.0000 mg | MEDICATED_PATCH | Freq: Every day | TRANSDERMAL | Status: DC

## 2019-05-08 MED ORDER — ACETAMINOPHEN 325 MG PO TABS: 650.0000 mg | ORAL_TABLET | Freq: Four times a day (QID) | ORAL | Status: DC | PRN

## 2019-05-08 MED ORDER — HYDRALAZINE HCL 20 MG/ML IJ SOLN
5.0000 mg | Freq: Four times a day (QID) | INTRAMUSCULAR | Status: DC | PRN
  Filled 2019-05-08: qty 1

## 2019-05-08 MED ORDER — ASPIRIN 81 MG PO CHEW: 324.0000 mg | CHEWABLE_TABLET | Freq: Once | ORAL | Status: DC

## 2019-05-08 MED ORDER — HEPARIN (PORCINE) 25000 UT/250ML-% IV SOLN
1650.0000 [IU]/h | INTRAVENOUS | Status: DC
  Administered 2019-05-08: 1650 [IU]/h via INTRAVENOUS
  Filled 2019-05-08: qty 250

## 2019-05-08 MED ORDER — LABETALOL HCL 200 MG PO TABS: 100.0000 mg | ORAL_TABLET | Freq: Two times a day (BID) | ORAL | Status: DC

## 2019-05-08 MED ORDER — LABETALOL HCL 200 MG PO TABS: 300.0000 mg | ORAL_TABLET | Freq: Two times a day (BID) | ORAL | Status: DC

## 2019-05-08 MED ORDER — ONDANSETRON HCL 4 MG/2ML IJ SOLN: 4.0000 mg | Freq: Four times a day (QID) | INTRAMUSCULAR | Status: DC | PRN

## 2019-05-08 MED ORDER — LORAZEPAM 2 MG/ML IJ SOLN
0.5000 mg | Freq: Once | INTRAMUSCULAR | Status: AC
  Administered 2019-05-08: 16:00:00 0.5 mg via INTRAVENOUS
  Filled 2019-05-08: qty 1

## 2019-05-08 MED ORDER — THIAMINE HCL 100 MG/ML IJ SOLN: 100.0000 mg | Freq: Every day | INTRAMUSCULAR | Status: DC

## 2019-05-08 MED ORDER — IPRATROPIUM-ALBUTEROL 0.5-2.5 (3) MG/3ML IN SOLN: 3.0000 mL | RESPIRATORY_TRACT | Status: DC | PRN

## 2019-05-08 MED ORDER — NITROGLYCERIN 0.4 MG SL SUBL: 0.4000 mg | SUBLINGUAL_TABLET | SUBLINGUAL | Status: DC | PRN

## 2019-05-08 MED ORDER — ATORVASTATIN CALCIUM 40 MG PO TABS: 40.0000 mg | ORAL_TABLET | Freq: Every day | ORAL | Status: DC

## 2019-05-08 MED ORDER — LORAZEPAM 1 MG PO TABS: 1.0000 mg | ORAL_TABLET | ORAL | Status: DC | PRN

## 2019-05-08 MED ORDER — POTASSIUM CHLORIDE 10 MEQ/100ML IV SOLN
10.0000 meq | Freq: Once | INTRAVENOUS | Status: AC
  Administered 2019-05-08: 10 meq via INTRAVENOUS
  Filled 2019-05-08: qty 100

## 2019-05-08 MED ORDER — LORAZEPAM 2 MG/ML IJ SOLN: 1.0000 mg | INTRAMUSCULAR | Status: DC | PRN

## 2019-05-08 NOTE — ED Provider Notes (Signed)
68 year old male signed out to me by Dr. Gilford Raid.  Patient presented today with cough and chest pain.  He has a history of smoking.  He reports a 45 pound weight loss in the past several months.  He has had a cough for a month.  He had a fall today.  He was seen and evaluated by Dr. Gilford Raid with CT of his head, chest, and abdomen. 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  Possible thrombus at the brachiocephalic confluence with marked narrowing and/or tumor thrombus of the SVC.  There is also suspicious airspace disease.  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 Elyse Hsu orders Discussed with Dr. Lamonte Sakai, on-call for pulmonary.  We have reviewed results of CT of chest.  Discussed need for anticoagulation.  Given that patient is at high risk for bleeding and hemoptysis had extensive risk-benefit discussion.  Patient also will need some type of definitive biopsy.  Discussion with Dr. Lamonte Sakai, have decided to start patient on heparin drip with no bolus.  Pharmacy is consulted for dosing. Plan consult to hospitalist for admission to stepdown unit.   Pattricia Boss, MD 05/08/19 [phone]

## 2019-05-08 NOTE — ED Notes (Signed)
admiting md spoke with patient. Pt adamant about leaving. Wife contacted. Pt dressed.

## 2019-05-08 NOTE — ED Triage Notes (Signed)
Pt bib ems from home after mechanical fall. Pt with bruising ?deformity to L forearm. Pt states he did hit his head. Denies LOC. Not on thinners. Pt with rhonchi and cough. Pitting edema to bil lower extremities. On arrival to the ED pt c/o "heart pain". 190/90 P 88 RR 24 97% RA

## 2019-05-08 NOTE — ED Notes (Signed)
Pt repeatedly asks to leave and go home. Pt is alert and oriented x4; Md notified of pt request.

## 2019-05-08 NOTE — ED Provider Notes (Addendum)
[facility] EMERGENCY DEPARTMENT Provider Note   CSN: [id number] Arrival date & time: 05/08/19  1253     History   Chief Complaint Chief Complaint  Patient presents with   Fall   Chest Pain    HPI Eric [name] is a 68 y.o. male.     Pt presents to the ED with cp, sob, and fall.  Pt said he had "heart" pain starting last night.  It was still there this morning.  He took nothing for it.  He woke up this morning and continued to have cp.  He fell walking to the bathroom.  Pt said he slipped.  He did hit his head.  No loc.  Left forearm pain after fall.  His wife called his pcp today saying pt was having trouble talking and walking after fall.  PCP told pt to come to the ED.  Pt also says that he's had a cough.  No f/c.  No known covid exposures.     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/24/2007   ANXIETY 06/24/2007   Arthritis    "hands, knees, hips, neck; q part of the body" (01/27/2015)   CELLULITIS AND ABSCESS OF LEG EXCEPT FOOT 03/28/2010   Cellulitis of left leg 01/27/2015   CHF (congestive heart failure) (HCC)    Chronic lower back pain    "I have a steel rod in my back"   Claudication ([place]) 06/02/2011   COLONIC POLYPS, HX OF 33/10/5454   Complication of anesthesia    "they can't put me asleep cause I don't wake up" (01/27/2015)   CONSTIPATION, CHRONIC, HX OF 06/24/2007   COPD (chronic obstructive pulmonary disease) ([place])    Coronary artery disease    CVA (cerebral vascular accident) ([place]) 1990's   "w/MI"; denies residual on 01/27/2015   DEPRESSION 06/24/2007   DIABETES MELLITUS, TYPE II dx'd 2015   DIVERTICULOSIS, COLON 06/24/2007   DVT (deep venous thrombosis) (HCC)    Heart murmur    HYPERLIPIDEMIA 06/24/2007   HYPERTENSION 06/24/2007   INSOMNIA-SLEEP DISORDER-UNSPEC 01/24/2010   Migraine    "comes over my right eye q now and then" (01/27/2015)   Morbid obesity ([place]) 12/21/2015    Myocardial infarction ([place]) 1990's X 1   "w/stroke"   OSA (obstructive sleep apnea)    SKIN LESION 01/24/2010   Unspecified Peripheral Vascular Disease 06/24/2007    Patient Active Problem List   Diagnosis Date Noted   Degenerative arthritis of knee, bilateral 01/24/2019   Recurrent falls 01/24/2019   Gait disorder 01/05/2018   Leg wound, left 01/04/2018   Open leg wound 01/04/2018   Cellulitis of right leg 08/31/2017   Dyspnea 08/31/2017   Peripheral edema 08/31/2017   Morbid obesity ([place]) 12/21/2015   Lower back pain 06/22/2015   Cough 06/22/2015   COPD exacerbation ([place]) 06/22/2015   Blurred vision, left eye 06/22/2015   Encounter for well adult exam with abnormal findings 02/10/2015   Anxiety and depression 01/27/2015   COPD (chronic obstructive pulmonary disease) ([place]) 01/27/2015   Dyslipidemia 01/27/2015   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[place]) 25/63/8937   Thrombocytopenia ([place]) 01/27/2015   AAA (abdominal aortic aneurysm) ([place]) 08/28/2014   Lumbar radiculopathy 07/20/2014   Bilateral lower extremity edema 06/29/2014   Bilateral knee pain 05/31/2014   Claudication ([place]) 06/02/2011   Obstructive sleep apnea 06/24/2007   Essential hypertension 06/24/2007   Allergic rhinitis 06/24/2007    Past Surgical History:  Procedure Laterality Date   ANTERIOR CERVICAL DECOMP/DISCECTOMY FUSION  1985   "job related injury"   BACK SURGERY     KNEE ARTHROSCOPY Bilateral 1990   LIGAMENT REPAIR Right 1999   "wrist; job related injury"   LUMBAR LAMINECTOMY  1990's        Home Medications    Prior to Admission medications   Medication Sig Start Date End Date Taking? Authorizing Provider  aspirin 81 MG tablet Take 81 mg by mouth daily.     Yes [provider]  Blood Glucose Monitoring Suppl DEVI 1 Device by Does not apply route daily. 02/10/15  Yes Biagio Borg, MD  clonazePAM (KLONOPIN) 0.5 MG tablet TAKE 1-2 TABLET BY MOUTH AT  BEDTIME, CAN TAKE UP TO 2 DEPENDING ON ANXIETY Patient taking differently: Take 0.25 mg by mouth 2 (two) times daily as needed for anxiety.  02/19/19  Yes Biagio Borg, MD  fenofibrate 160 MG tablet TAKE 1 TABLET BY MOUTH EVERY DAY 01/30/19  Yes Biagio Borg, MD  furosemide (LASIX) 20 MG tablet Take 0.5 tablets (10 mg total) by mouth daily. Patient taking differently: Take 20 mg by mouth daily.  09/12/17  Yes Biagio Borg, MD  gabapentin (NEURONTIN) 100 MG capsule TAKE 2 CAPSULES BY MOUTH EVERY DAY AT BEDTIME Patient taking differently: Take 200 mg by mouth at bedtime.  03/10/19  Yes Biagio Borg, MD  glucose blood test strip Use as instructed 02/10/15  Yes Biagio Borg, MD  hydrochlorothiazide (HYDRODIURIL) 25 MG tablet TAKE 1 TABLET BY MOUTH EVERY DAY 03/24/19  Yes Biagio Borg, MD  labetalol (NORMODYNE) 300 MG tablet TAKE 1 TABLET BY MOUTH TWICE A DAY 04/22/19  Yes Biagio Borg, MD  Lancets (ACCU-CHEK SOFT TOUCH) lancets Use as instructed 01/25/17  Yes Biagio Borg, MD  metFORMIN (GLUCOPHAGE) 1000 MG tablet TAKE 1 TABLET BY MOUTH 2 TIMES DAILY WITH A MEAL. Patient taking differently: Take 500 mg by mouth 2 (two) times daily.  04/14/19  Yes Biagio Borg, MD  simvastatin (ZOCOR) 80 MG tablet TAKE 1 TABLET BY MOUTH EVERYDAY AT BEDTIME Patient taking differently: Take 80 mg by mouth at bedtime.  02/03/19  Yes Biagio Borg, MD  traZODone (DESYREL) 50 MG tablet TAKE 1/2 TO 1 TABLET AT BEDTIME AS NEEDED FOR SLEEP Patient taking differently: Take 50 mg by mouth at bedtime.  04/01/19  Yes Lyndal Pulley, DO  Vitamin D, Ergocalciferol, (DRISDOL) 1.25 MG (50000 UT) CAPS capsule Take 1 capsule (50,000 Units total) by mouth every 7 (seven) days. 01/24/19  Yes Biagio Borg, MD  Blood Glucose Monitoring Suppl ([place]) w/Device KIT use as directed to check insulin levels 03/27/17   Biagio Borg, MD    Family History Family History  Problem Relation Age of Onset   COPD Father    Heart  disease Father    Cancer Mother        lung cancer   Heart disease Maternal Grandmother    Heart disease Maternal Grandfather    Heart disease Brother    Hypertension Brother    Heart attack Brother    Peripheral vascular disease Brother     Social History Social History   Tobacco Use   Smoking status: Current Every Day Smoker    Years: 48.00    Types: Cigars   Smokeless tobacco: Never Used   Tobacco comment: 01/27/2015 pt states he smokes 5 cigar cigarettes daily  Substance Use  Topics   Alcohol use: Yes    Alcohol/week: 0.0 standard drinks    Comment: 01/27/2015 "might have a couple beers/yr"   Drug use: No     Allergies   Latex and Oxycodone   Review of Systems Review of Systems  Respiratory: Positive for cough and shortness of breath.   Cardiovascular: Positive for chest pain.  Musculoskeletal:       Left forearm pain  All other systems reviewed and are negative.    Physical Exam Updated Vital Signs BP (!) 187/77    Pulse 84    Temp 97.9 F (36.6 C) (Temporal)    Resp 17    SpO2 93%   Physical Exam Vitals signs and nursing note reviewed.  Constitutional:      Appearance: He is well-developed. He is obese.  HENT:     Head: Normocephalic and atraumatic.  Eyes:     Pupils: Pupils are equal, round, and reactive to light.  Neck:     Musculoskeletal: Normal range of motion and neck supple.  Cardiovascular:     Rate and Rhythm: Normal rate and regular rhythm.     Heart sounds: Normal heart sounds.  Pulmonary:     Eff[place]: Tachypnea present.     Breath sounds: Rhonchi present.  Abdominal:     General: Bowel sounds are normal.     Palpations: Abdomen is soft.  Musculoskeletal:     Right lower leg: Edema present.     Left lower leg: Edema present.     Comments: Skin tear left hand.  Mild tenderness left forearm.  Skin:    General: Skin is warm.     Capillary Refill: Capillary refill takes less than 2 seconds.  Neurological:     General: No  focal deficit present.     Mental Status: He is alert and oriented to person, place, and time.  Psychiatric:        Mood and Affect: Mood is anxious.      ED Treatments / Results  Labs (all labs ordered are listed, but only abnormal results are displayed) Labs Reviewed  BASIC METABOLIC PANEL - Abnormal; Notable for the following components:      Result Value   Potassium 2.6 (*)    Glucose, Bld 119 (*)    All other components within normal limits  CBC - Abnormal; Notable for the following components:   RBC 4.14 (*)    MCV 108.0 (*)    MCH 34.8 (*)    Platelets 46 (*)    All other components within normal limits  TROPONIN I (HIGH SENSITIVITY) - Abnormal; Notable for the following components:   Troponin I (High Sensitivity) 31 (*)    All other components within normal limits  SARS CORONAVIRUS 2 (TAT 6-24 HRS)  CULTURE, BLOOD (ROUTINE X 2)  CULTURE, BLOOD (ROUTINE X 2)  LACTIC ACID, PLASMA  MAGNESIUM  LACTIC ACID, PLASMA  MAGNESIUM  BRAIN NATRIURETIC PEPTIDE  TROPONIN I (HIGH SENSITIVITY)    EKG EKG Interpretation  Date/Time:  Thursday May 08 2019 13:07:43 EDT Ventricular Rate:  82 PR Interval:    QRS Duration: 118 QT Interval:  451 QTC Calculation: 527 R Axis:   -68 Text Interpretation:  Sinus rhythm Probable left atrial enlargement Left anterior fascicular block No significant change since last tracing Confirmed by Isla Pence [phone]) on 05/08/2019 1:14:19 PM Also confirmed by Pattricia Boss [phone])  on 05/08/2019 3:18:30 PM   Radiology Dg Chest 2 View  Result Date: 05/08/2019 CLINICAL  DATA:  Shortness of breath for 6 months. Occasional cough for 1 month. Chest pain today. Fall earlier today. EXAM: CHEST - 2 VIEW COMPARISON:  None. FINDINGS: Masslike opacity extends from right hilum to the medial right lower lung. There is an additional focal opacity that may reflect a mass, measuring 3 cm, in the peripheral right upper lobe. More hazy type opacity is noted  below the right upper lobe masslike opacity. Both lungs show prominent bronchovascular and interstitial markings most evident in the bases, similar to the prior exam. No pleural effusion.  No pneumothorax. Cardiac silhouette is normal in size. There is thickening of the right paratracheal stripe suggesting adenopathy. Left hilum contour is unremarkable. Skeletal structures are intact. IMPRESSION: 1. 3 cm masslike opacity in the peripheral right upper lobe. 2. Masslike opacity in the medial mid to lower lung extending from the right hilum. Thickening of the right paratracheal stripe suggesting adenopathy. 3. Hazy opacity in the right upper lobe inferior to the masslike opacity, which may reflect pneumonia. 4. Recommend follow-up chest CT with contrast for further evaluation these findings. Electronically Signed   By: Lajean Manes M.D.   On: 05/08/2019 13:44   Dg Forearm Left  Result Date: 05/08/2019 CLINICAL DATA:  Fall today.  Left forearm pain. EXAM: LEFT FOREARM - 2 VIEW COMPARISON:  None. FINDINGS: No fracture.  No bone lesion. Wrist and elbow joints are normally aligned. There is soft tissue edema over the ulnar, volar aspect of the wrist and distal forearm. IMPRESSION: No fracture or dislocation. Electronically Signed   By: Lajean Manes M.D.   On: 05/08/2019 13:45   Dg Hand Complete Left  Result Date: 05/08/2019 CLINICAL DATA:  Fall today.  Left wrist pain. EXAM: LEFT HAND - COMPLETE 3+ VIEW COMPARISON:  None. FINDINGS: No fracture or bone lesion. Joints are normally spaced and aligned. There is a soft tissue defect over the dorsal aspect of the hand consistent with a laceration. No radiopaque foreign body. IMPRESSION: 1. No fracture or dislocation. 2. Dorsal hand soft tissue laceration. Electronically Signed   By: Lajean Manes M.D.   On: 05/08/2019 13:46    Procedures Procedures (including critical care time)  Medications Ordered in ED Medications  nitroGLYCERIN (NITROSTAT) SL tablet 0.4 mg  (has no administration in time range)     Initial Impression / Assessment and Plan / ED Course  I have reviewed the triage vital signs and the nursing notes.  Pertinent labs & imaging results that were available during my care of the patient were reviewed by me and considered in my medical decision making (see chart for details).    CXR reviewed and slightly abnormal.  CT chest ordered and is pending at shift.  CP is more likely to be pulmonary as opposed to cardiac.  Pt signed out to Dr. Jeanell Sparrow at shift change.  K replaced.  Mag level pending.  CRITICAL CARE Performed by: Isla Pence   Total critical care time: 30 minutes  Critical care time was exclusive of separately billable procedures and treating other patients.  Critical care was necessary to treat or prevent imminent or life-threatening deterioration.  Critical care was time spent personally by me on the following activities: development of treatment plan with patient and/or surrogate as well as nursing, discussions with consultants, evaluation of patient's response to treatment, examination of patient, obtaining history from patient or surrogate, ordering and performing treatments and interventions, ordering and review of laboratory studies, ordering and review of radiographic studies, pulse oximetry  and re-evaluation of patient's condition.  Final Clinical Impressions(s) / ED Diagnoses   Final diagnoses:  Hypokalemia  Dyspnea on exertion    ED Discharge Orders    None       Isla Pence, MD 05/08/19 [place]    Isla Pence, MD 05/14/19 1343

## 2019-05-08 NOTE — ED Notes (Signed)
PAGED TRIAD TO RN TINA--Eric [name]

## 2019-05-08 NOTE — Telephone Encounter (Signed)
Copied from [place] [phone]. Topic: General - Other >> May 08, 2019 10:25 AM Mathis Bud wrote: Reason for CRM: Patient is requesting to come in today to the office.  Patient did have a fall this morning and cut his hand. Patient does not need stiches.  His wife got on the phone after patient and explained patient is having issues with his bowels and he is having trouble talking and walking.  Patients wife expressed she would like him to come see the doctor and get a referral to figure out what is wrong with patient Patient call back  [phone] >> May 08, 2019 10:38 AM Morey Hummingbird wrote: After speaking to wife I encouraged patient to go to emergency room. Wife agreed

## 2019-05-08 NOTE — ED Notes (Signed)
Pt placed on venturi mask for hypoxia. Pt oxygen saturations improved to 93%.

## 2019-05-08 NOTE — H&P (Signed)
History and Physical  Eric [name] EPP:[id number] DOB: 02/09/1951 DOA: 05/08/2019  Referring physician: Dr. Gilford Raid  PCP: Biagio Borg, MD  Outpatient Specialists: None Patient coming from: Home  Chief Complaint: Chest pain and nonproductive cough for 1 month.  HPI: Eric [name] is a 68 year old obese male with past medical history significant for obesity, hyperlipidemia, hypertension, chronic anxiety, type 2 diabetes, polyneuropathy, coronary artery disease, COPD, not on oxygen supplementation, who presented to [facility] ED with complaints of centrally located chest pain and nonproductive cough of 1 month duration.  Associated with worsening generalized weakness and recurrent falls.  Patient is a poor historian and is reluctant to answer questions.  History is mainly obtained from EDP and patient himself.  He is alert and oriented x4.  States he has been falling frequently in the last month or so, losing his balance.  Chest pain is intermittent, central, sharp and nonradiating.  Associated with a nonproductive cough.  Worse when he coughs.  Patient is a smoker, half a pack a day for at least 40 years.  Cannot tell me whether or not he has lost weight or has had any night sweats.  Denies use of oxygen supplementation at home or CPAP.  States he got really weak today and fell and hit his head.  Presented to the ED for further evaluation.  In the ED, CT head showed no acute intracranial findings CT chest, abdomen and pelvis with contrast showed large right hilar mass and possible thrombus of the SVC with suspected mets in the abdomen.  Also noted suspected postobstructive infiltrate indicative of postobstructive pneumonia.  Started on IV Zosyn and IV heparin in the ED. TRH asked to admit.  Review of Systems: Review of systems as noted in the HPI. All other systems reviewed and are negative.   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/24/2007   ANXIETY  06/24/2007   Arthritis    "hands, knees, hips, neck; q part of the body" (01/27/2015)   CELLULITIS AND ABSCESS OF LEG EXCEPT FOOT 03/28/2010   Cellulitis of left leg 01/27/2015   CHF (congestive heart failure) (HCC)    Chronic lower back pain    "I have a steel rod in my back"   Claudication ([place]) 06/02/2011   COLONIC POLYPS, HX OF 60/01/3015   Complication of anesthesia    "they can't put me asleep cause I don't wake up" (01/27/2015)   CONSTIPATION, CHRONIC, HX OF 06/24/2007   COPD (chronic obstructive pulmonary disease) (HCC)    Coronary artery disease    CVA (cerebral vascular accident) ([place]) 1990's   "w/MI"; denies residual on 01/27/2015   DEPRESSION 06/24/2007   DIABETES MELLITUS, TYPE II dx'd 2015   DIVERTICULOSIS, COLON 06/24/2007   DVT (deep venous thrombosis) ([place])    Heart murmur    HYPERLIPIDEMIA 06/24/2007   HYPERTENSION 06/24/2007   INSOMNIA-SLEEP DISORDER-UNSPEC 01/24/2010   Migraine    "comes over my right eye q now and then" (01/27/2015)   Morbid obesity ([place]) 12/21/2015   Myocardial infarction ([place]) 1990's X 1   "w/stroke"   OSA (obstructive sleep apnea)    SKIN LESION 01/24/2010   Unspecified Peripheral Vascular Disease 06/24/2007   Past Surgical History:  Procedure Laterality Date   ANTERIOR CERVICAL DECOMP/DISCECTOMY FUSION  1985   "job related injury"   [place] ARTHROSCOPY Bilateral Ewing Right 1999   "wrist; job related injury"  LUMBAR LAMINECTOMY  1990's    Social History:  reports that he has been smoking cigars. He has smoked for the past 48.00 years. He has never used smokeless tobacco. He reports current alcohol use. He reports that he does not use drugs.   Allergies  Allergen Reactions   Latex Swelling   Oxycodone Other (See Comments)    Sedation/somnolence    Family History  Problem Relation Age of Onset   COPD Father    Heart disease Father    Cancer Mother        lung cancer   Heart  disease Maternal Grandmother    Heart disease Maternal Grandfather    Heart disease Brother    Hypertension Brother    Heart attack Brother    Peripheral vascular disease Brother       Prior to Admission medications   Medication Sig Start Date End Date Taking? Authorizing Provider  aspirin 81 MG tablet Take 81 mg by mouth daily.     Yes [provider]  Blood Glucose Monitoring Suppl DEVI 1 Device by Does not apply route daily. 02/10/15  Yes Biagio Borg, MD  clonazePAM (KLONOPIN) 0.5 MG tablet TAKE 1-2 TABLET BY MOUTH AT BEDTIME, CAN TAKE UP TO 2 DEPENDING ON ANXIETY Patient taking differently: Take 0.25 mg by mouth 2 (two) times daily as needed for anxiety.  02/19/19  Yes Biagio Borg, MD  fenofibrate 160 MG tablet TAKE 1 TABLET BY MOUTH EVERY DAY 01/30/19  Yes Biagio Borg, MD  furosemide (LASIX) 20 MG tablet Take 0.5 tablets (10 mg total) by mouth daily. Patient taking differently: Take 20 mg by mouth daily.  09/12/17  Yes Biagio Borg, MD  gabapentin (NEURONTIN) 100 MG capsule TAKE 2 CAPSULES BY MOUTH EVERY DAY AT BEDTIME Patient taking differently: Take 200 mg by mouth at bedtime.  03/10/19  Yes Biagio Borg, MD  glucose blood test strip Use as instructed 02/10/15  Yes Biagio Borg, MD  hydrochlorothiazide (HYDRODIURIL) 25 MG tablet TAKE 1 TABLET BY MOUTH EVERY DAY 03/24/19  Yes Biagio Borg, MD  labetalol (NORMODYNE) 300 MG tablet TAKE 1 TABLET BY MOUTH TWICE A DAY 04/22/19  Yes Biagio Borg, MD  Lancets (ACCU-CHEK SOFT TOUCH) lancets Use as instructed 01/25/17  Yes Biagio Borg, MD  metFORMIN (GLUCOPHAGE) 1000 MG tablet TAKE 1 TABLET BY MOUTH 2 TIMES DAILY WITH A MEAL. Patient taking differently: Take 500 mg by mouth 2 (two) times daily.  04/14/19  Yes Biagio Borg, MD  simvastatin (ZOCOR) 80 MG tablet TAKE 1 TABLET BY MOUTH EVERYDAY AT BEDTIME Patient taking differently: Take 80 mg by mouth at bedtime.  02/03/19  Yes Biagio Borg, MD  traZODone (DESYREL) 50 MG tablet  TAKE 1/2 TO 1 TABLET AT BEDTIME AS NEEDED FOR SLEEP Patient taking differently: Take 50 mg by mouth at bedtime.  04/01/19  Yes Lyndal Pulley, DO  Vitamin D, Ergocalciferol, (DRISDOL) 1.25 MG (50000 UT) CAPS capsule Take 1 capsule (50,000 Units total) by mouth every 7 (seven) days. 01/24/19  Yes Biagio Borg, MD  Blood Glucose Monitoring Suppl (ACCU-CHEK NANO SMARTVIEW) w/Device KIT use as directed to check insulin levels 03/27/17   Biagio Borg, MD    Physical Exam: BP (!) 167/75    Pulse 87    Temp 97.9 F (36.6 C) (Temporal)    Resp 20    Ht 5' 11"  (1.803 m)    Wt 108.9 kg  SpO2 (!) 87%    BMI 33.47 kg/m    General: 68 y.o. year-old male well developed well nourished in no acute distress.  Alert and oriented x4.  Cardiovascular: Regular rate and rhythm with no rubs or gallops.  No thyromegaly or JVD noted.  Trace lower extremity edema. 2/4 pulses in all 4 extremities.  Respiratory: Mild rales at bases.  No wheezing noted.  Poor inspiratory effort.  Abdomen: Soft nontender nondistended with normal bowel sounds x4 quadrants.  Muskuloskeletal: No cyanosis, clubbing, trace edema noted bilaterally  Neuro: CN II-XII intact, strength, sensation, reflexes  Skin: No ulcerative lesions noted or rashes.  Right shoulder bruising.  Psychiatry: Judgement and insight appear normal. Mood is appropriate for condition and setting          Labs on Admission:  Basic Metabolic Panel: Recent Labs  Lab 05/08/19 1259 05/08/19 1433  NA 144  --   K 2.6*  --   CL 102  --   CO2 30  --   GLUCOSE 119*  --   BUN 22  --   CREATININE 1.11  --   CALCIUM 9.2  --   MG  --  2.0   Liver Function Tests: No results for input(s): AST, ALT, ALKPHOS, BILITOT, PROT, ALBUMIN in the last 168 hours. No results for input(s): LIPASE, AMYLASE in the last 168 hours. No results for input(s): AMMONIA in the last 168 hours. CBC: Recent Labs  Lab 05/08/19 1259  WBC 4.3  HGB 14.4  HCT 44.7  MCV 108.0*  PLT 46*    Cardiac Enzymes: No results for input(s): CKTOTAL, CKMB, CKMBINDEX, TROPONINI in the last 168 hours.  BNP (last 3 results) No results for input(s): BNP in the last 8760 hours.  ProBNP (last 3 results) No results for input(s): PROBNP in the last 8760 hours.  CBG: No results for input(s): GLUCAP in the last 168 hours.  Radiological Exams on Admission: Dg Chest 2 View  Result Date: 05/08/2019 CLINICAL DATA:  Shortness of breath for 6 months. Occasional cough for 1 month. Chest pain today. Fall earlier today. EXAM: CHEST - 2 VIEW COMPARISON:  None. FINDINGS: Masslike opacity extends from right hilum to the medial right lower lung. There is an additional focal opacity that may reflect a mass, measuring 3 cm, in the peripheral right upper lobe. More hazy type opacity is noted below the right upper lobe masslike opacity. Both lungs show prominent bronchovascular and interstitial markings most evident in the bases, similar to the prior exam. No pleural effusion.  No pneumothorax. Cardiac silhouette is normal in size. There is thickening of the right paratracheal stripe suggesting adenopathy. Left hilum contour is unremarkable. Skeletal structures are intact. IMPRESSION: 1. 3 cm masslike opacity in the peripheral right upper lobe. 2. Masslike opacity in the medial mid to lower lung extending from the right hilum. Thickening of the right paratracheal stripe suggesting adenopathy. 3. Hazy opacity in the right upper lobe inferior to the masslike opacity, which may reflect pneumonia. 4. Recommend follow-up chest CT with contrast for further evaluation these findings. Electronically Signed   By: Lajean Manes M.D.   On: 05/08/2019 13:44   Dg Forearm Left  Result Date: 05/08/2019 CLINICAL DATA:  Fall today.  Left forearm pain. EXAM: LEFT FOREARM - 2 VIEW COMPARISON:  None. FINDINGS: No fracture.  No bone lesion. Wrist and elbow joints are normally aligned. There is soft tissue edema over the ulnar, volar  aspect of the wrist and distal forearm. IMPRESSION:  No fracture or dislocation. Electronically Signed   By: Lajean Manes M.D.   On: 05/08/2019 13:45   Ct Head Wo Contrast  Result Date: 05/08/2019 CLINICAL DATA:  Headache. EXAM: CT HEAD WITHOUT CONTRAST TECHNIQUE: Contiguous axial images were obtained from the base of the skull through the vertex without intravenous contrast. COMPARISON:  None. FINDINGS: Brain: No evidence of an acute infarct or intracranial hemorrhage. 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 Ventricles normal in configuration mild age related ventricular and sulcal enlargement. No hydrocephalus. Small left parafalcine hyperattenuating mass measuring 13 x 8 x 7 mm in size, consistent with a meningioma. No other extra-axial masses or abnormalities. Patchy white matter hypoattenuation noted bilaterally consistent moderate chronic microvascular ischemic change. Vascular: No hyperdense vessel or unexpected calcification. Skull: Normal. Negative for fracture or focal lesion. Sinuses/Orbits: Globes and orbits are unremarkable. Sinuses are clear. Other: None. IMPRESSION: 1. No acute intracranial abnormalities. 2. 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 Moderate chronic microvascular ischemic change. Small left parafalcine meningioma. Electronically Signed   By: Lajean Manes M.D.   On: 05/08/2019 16:11   Ct Chest W Contrast  Result Date: 05/08/2019 CLINICAL DATA:  Generalized abdominal pain, dyspnea EXAM: CT CHEST, ABDOMEN, AND PELVIS WITH CONTRAST TECHNIQUE: Multidetector CT imaging of the chest, abdomen and pelvis was performed following the standard protocol during bolus administration of intravenous contrast. CONTRAST:  111m OMNIPAQUE IOHEXOL 300 MG/ML  SOLN COMPARISON:  05/08/2019 chest x-ray FINDINGS: CT CHEST FINDINGS Cardiovascular: Moderate aortic atherosclerosis. No aneurysmal dilatation. Extensive coronary vascular calcification. Mild cardiomegaly. No  significant pericardial effusion. Possible filling defect/thrombus within at the brachiocephalic confluence. Marked narrowing and or tumor invasion of the SVC. Mediastinum/Nodes: Midline trachea. No thyroid mass. Mild leftward displacement of the esophagus by mediastinal mass lesion. Large, bulky right hilar mass measuring approximately 10.2 cm craniocaudad by 6.9 cm transverse by 9.5 cm oblique AP. Mediastinal invasion with narrowing and encasement of the right pulmonary artery and right hilar vessels. Irregular soft tissue density within the right mainstem bronchus, and extending into right lower lobe bronchus. Tumor occlusion of right upper and middle lobe bronchi. Multiple enlarged mediastinal lymph nodes. Right upper paratracheal lymph nodes measuring up to 2.2 cm, become contiguous with bulky nodes and or mass anterior to the carina and within the subcarinal region. Bulky enlarged low axillary nodes, measuring 3.2 cm on the right and 3.1 cm on the left. Lungs/Pleura: No pneumothorax or pleural effusion. 2.5 x 3.2 cm right upper lobe lung mass abutting the pleural surface, series 5, image number 38. Irregular masslike consolidation in the right upper lobe measuring 2.9 x 2.9 cm, series 5, image number 50. Musculoskeletal: Sternum is intact. No acute or suspicious osseous abnormality. CT ABDOMEN PELVIS FINDINGS Hepatobiliary: Nodular liver contour, suspect for cirrhosis. Heterogenous attenuation of liver without well-defined or definitive discrete mass. Probable layering sludge or small stones in the gallbladder. No biliary dilatation Pancreas: No inflammatory change.  No ductal dilatation. Spleen: Enlarged, measuring 17 cm cranial caudad. No focal abnormality. Adrenals/Urinary Tract: Bilateral adrenal masses, measuring 3.7 cm on the right and 2.6 cm on the left. No left hydronephrosis. Mild right hydronephrosis, felt secondary to obstruction of proximal right ureter by a 3.4 cm soft tissue mass/suspected  metastatic lesion. Cyst mid right kidney. Slightly dense 2.8 cm cyst posterior mid right kidney. Small indeterminate possible exophytic mass lower pole left kidney measuring 15 mm, series 3, image number 89. Multiple perinephric soft tissue masses, for example right posterior perinephric mass measures 2.6 cm, series  3, image number 68. Left superior 3.4 cm lobulated soft tissue mass, series 3, image number 66, 2.3 cm left mid posterior perinephric mass, series 3, image number 70, and 3.6 cm left posterior mid to lower perinephric mass, series 3 image number 79. Numerous smaller masses within the perinephric space. Urinary bladder demonstrates possible small 1 cm nodular lesion along the anterior wall, series 3, image number 111. Stomach/Bowel: The stomach is nonenlarged. No dilated small bowel. No colon wall thickening. Negative appendix. There may be mild asymmetric left posterior rectal wall thickening. Vascular/Lymphatic: Extensive aortic atherosclerosis. Aneurysmal dilatation of the distal infrarenal abdominal aorta measuring up to 3.8 cm. Multiple enlarged retroperitoneal/Peri aortic lymph nodes, measuring up to 18 mm. Reproductive: Prostate is unremarkable. Other: No free air. Small amount of free fluid in the abdomen and pelvis. Numerous soft tissue masses and nodules within the abdomen and pelvis. 4.4 cm lobulated mass adjacent to the inferior right hepatic lobe, series 3, image number 66. Numerous small nodules in the right gutter. Small nodules in the left lower quadrant. 2.7 cm left upper anterior pelvic nodule, series 3, image number 106. 1.5 cm soft tissue nodule anterior to the rectum, series 3, image number 114. Musculoskeletal: No acute or suspicious osseous abnormality. Postsurgical changes at L4-L5. Diffuse degenerative changes. 2.8 cm suspicious soft tissue mass in the left gluteal muscles, series 3, image number 102. IMPRESSION: 1. Large right hilar mass with mediastinal invasion and bulky  mediastinal adenopathy. Irregular soft tissue mass and thickening involving the right mainstem bronchus, extending into right lower lobe with suspected occlusion of right upper and middle lobe bronchi. Narrowing of the right pulmonary artery and hilar vessels by bulky mass/adenopathy. Possible thrombus at the brachiocephalic confluence with marked narrowing and or tumor thrombus of the SVC. 2. Suspicious airspace disease/lung masses in the right upper lobe. 3. Bulky low axillary adenopathy, concerning for metastatic disease 4. Bilateral adrenal gland masses, suspect for metastatic disease. Numerous perinephric soft tissue masses and multiple suspicious masses within the retroperitoneum and peritoneal cavity, all concerning for metastatic disease. 2.8 cm probable metastatic mass within the left gluteal musculature. 5. Suspected liver cirrhosis. Splenomegaly. Small amount of free fluid in the abdomen and pelvis. Possible gallbladder sludge or small stones 6. Indeterminate bilateral renal lesions, could be evaluated with nonemergent MRI. Mild right hydronephrosis, felt secondary to obstruction of proximal right ureter by metastatic soft tissue mass at the level of the UPJ. Possible 1 cm soft tissue nodule along the anterior aspect of the urinary bladder. 7. There may be asymmetric focal wall thickening of the left rectum, correlation with direct inspection as clinically indicated. 8. 3.8 cm distal infrarenal abdominal aortic aneurysm. Recommend followup by ultrasound in 3 years. This recommendation follows ACR consensus guidelines: White Paper of the ACR Incidental Findings Committee II on Vascular Findings. [facility] Radiol 2013; 10:789-794 Electronically Signed   By: Donavan Foil M.D.   On: 05/08/2019 16:51   Ct Abdomen Pelvis W Contrast  Result Date: 05/08/2019 CLINICAL DATA:  Generalized abdominal pain, dyspnea EXAM: CT CHEST, ABDOMEN, AND PELVIS WITH CONTRAST TECHNIQUE: Multidetector CT imaging of the chest,  abdomen and pelvis was performed following the standard protocol during bolus administration of intravenous contrast. CONTRAST:  164m OMNIPAQUE IOHEXOL 300 MG/ML  SOLN COMPARISON:  05/08/2019 chest x-ray FINDINGS: CT CHEST FINDINGS Cardiovascular: Moderate aortic atherosclerosis. No aneurysmal dilatation. Extensive coronary vascular calcification. Mild cardiomegaly. No significant pericardial effusion. Possible filling defect/thrombus within at the brachiocephalic confluence. Marked narrowing and or tumor  invasion of the SVC. Mediastinum/Nodes: Midline trachea. No thyroid mass. Mild leftward displacement of the esophagus by mediastinal mass lesion. Large, bulky right hilar mass measuring approximately 10.2 cm craniocaudad by 6.9 cm transverse by 9.5 cm oblique AP. Mediastinal invasion with narrowing and encasement of the right pulmonary artery and right hilar vessels. Irregular soft tissue density within the right mainstem bronchus, and extending into right lower lobe bronchus. Tumor occlusion of right upper and middle lobe bronchi. Multiple enlarged mediastinal lymph nodes. Right upper paratracheal lymph nodes measuring up to 2.2 cm, become contiguous with bulky nodes and or mass anterior to the carina and within the subcarinal region. Bulky enlarged low axillary nodes, measuring 3.2 cm on the right and 3.1 cm on the left. Lungs/Pleura: No pneumothorax or pleural effusion. 2.5 x 3.2 cm right upper lobe lung mass abutting the pleural surface, series 5, image number 38. Irregular masslike consolidation in the right upper lobe measuring 2.9 x 2.9 cm, series 5, image number 50. Musculoskeletal: Sternum is intact. No acute or suspicious osseous abnormality. CT ABDOMEN PELVIS FINDINGS Hepatobiliary: Nodular liver contour, suspect for cirrhosis. Heterogenous attenuation of liver without well-defined or definitive discrete mass. Probable layering sludge or small stones in the gallbladder. No biliary dilatation Pancreas:  No inflammatory change.  No ductal dilatation. Spleen: Enlarged, measuring 17 cm cranial caudad. No focal abnormality. Adrenals/Urinary Tract: Bilateral adrenal masses, measuring 3.7 cm on the right and 2.6 cm on the left. No left hydronephrosis. Mild right hydronephrosis, felt secondary to obstruction of proximal right ureter by a 3.4 cm soft tissue mass/suspected metastatic lesion. Cyst mid right kidney. Slightly dense 2.8 cm cyst posterior mid right kidney. Small indeterminate possible exophytic mass lower pole left kidney measuring 15 mm, series 3, image number 89. Multiple perinephric soft tissue masses, for example right posterior perinephric mass measures 2.6 cm, series 3, image number 68. Left superior 3.4 cm lobulated soft tissue mass, series 3, image number 66, 2.3 cm left mid posterior perinephric mass, series 3, image number 70, and 3.6 cm left posterior mid to lower perinephric mass, series 3 image number 79. Numerous smaller masses within the perinephric space. Urinary bladder demonstrates possible small 1 cm nodular lesion along the anterior wall, series 3, image number 111. Stomach/Bowel: The stomach is nonenlarged. No dilated small bowel. No colon wall thickening. Negative appendix. There may be mild asymmetric left posterior rectal wall thickening. Vascular/Lymphatic: Extensive aortic atherosclerosis. Aneurysmal dilatation of the distal infrarenal abdominal aorta measuring up to 3.8 cm. Multiple enlarged retroperitoneal/Peri aortic lymph nodes, measuring up to 18 mm. Reproductive: Prostate is unremarkable. Other: No free air. Small amount of free fluid in the abdomen and pelvis. Numerous soft tissue masses and nodules within the abdomen and pelvis. 4.4 cm lobulated mass adjacent to the inferior right hepatic lobe, series 3, image number 66. Numerous small nodules in the right gutter. Small nodules in the left lower quadrant. 2.7 cm left upper anterior pelvic nodule, series 3, image number 106. 1.5  cm soft tissue nodule anterior to the rectum, series 3, image number 114. Musculoskeletal: No acute or suspicious osseous abnormality. Postsurgical changes at L4-L5. Diffuse degenerative changes. 2.8 cm suspicious soft tissue mass in the left gluteal muscles, series 3, image number 102. IMPRESSION: 1. Large right hilar mass with mediastinal invasion and bulky mediastinal adenopathy. Irregular soft tissue mass and thickening involving the right mainstem bronchus, extending into right lower lobe with suspected occlusion of right upper and middle lobe bronchi. Narrowing of the right pulmonary artery  and hilar vessels by bulky mass/adenopathy. Possible thrombus at the brachiocephalic confluence with marked narrowing and or tumor thrombus of the SVC. 2. Suspicious airspace disease/lung masses in the right upper lobe. 3. Bulky low axillary adenopathy, concerning for metastatic disease 4. Bilateral adrenal gland masses, suspect for metastatic disease. Numerous perinephric soft tissue masses and multiple suspicious masses within the retroperitoneum and peritoneal cavity, all concerning for metastatic disease. 2.8 cm probable metastatic mass within the left gluteal musculature. 5. Suspected liver cirrhosis. Splenomegaly. Small amount of free fluid in the abdomen and pelvis. Possible gallbladder sludge or small stones 6. Indeterminate bilateral renal lesions, could be evaluated with nonemergent MRI. Mild right hydronephrosis, felt secondary to obstruction of proximal right ureter by metastatic soft tissue mass at the level of the UPJ. Possible 1 cm soft tissue nodule along the anterior aspect of the urinary bladder. 7. There may be asymmetric focal wall thickening of the left rectum, correlation with direct inspection as clinically indicated. 8. 3.8 cm distal infrarenal abdominal aortic aneurysm. Recommend followup by ultrasound in 3 years. This recommendation follows ACR consensus guidelines: White Paper of the ACR  Incidental Findings Committee II on Vascular Findings. [facility] Radiol 2013; 10:789-794 Electronically Signed   By: Donavan Foil M.D.   On: 05/08/2019 16:51   Dg Hand Complete Left  Result Date: 05/08/2019 CLINICAL DATA:  Fall today.  Left wrist pain. EXAM: LEFT HAND - COMPLETE 3+ VIEW COMPARISON:  None. FINDINGS: No fracture or bone lesion. Joints are normally spaced and aligned. There is a soft tissue defect over the dorsal aspect of the hand consistent with a laceration. No radiopaque foreign body. IMPRESSION: 1. No fracture or dislocation. 2. Dorsal hand soft tissue laceration. Electronically Signed   By: Lajean Manes M.D.   On: 05/08/2019 13:46    EKG: I independently viewed the EKG done and my findings are as followed: Sinus rhythm with rate of 82 with no specific ST-T changes.  QTc 527.  Assessment/Plan Present on Admission:  Mediastinal mass  Active Problems:   Mediastinal mass   Chest pain, suspect pleuritic in the setting of large right hilar mass and suspected thrombus of the SVC  Started on IV heparin in the ED Pharmacy to manage IV Start continuous O2 monitoring Optimize pain control Start Tussionex twice daily  Elevated troponin Presented with troponin 31 Repeated troponin 32 Personally reviewed twelve-lead EKG with no sign of acute ischemia Continue to cycle troponin x2 Obtain 2D echo Continue to closely monitor  Large right hilar mass with suspected mets in abdomen Patient will need biopsy Please consult pulmonology in the morning for possible bronchoscopy with biopsy  Acute hypoxic respiratory failure likely secondary to postobstructive pneumonia versus others Treat underlying causes Maintain O2 saturation greater than 92%  Suspected postobstructive pneumonia Independently reviewed CT chest, abdomen and pelvis with contrast done on admission which showed right hilar mass with infiltrates mainly affecting the right lobes Started on IV Zosyn,  continue Obtain procalcitonin level and lactic acid in the morning Monitor fever curve and WBC Obtain CBC with differentials in the morning  Type 2 diabetes, non-insulin-dependent Obtain hemoglobin A1c Hold metformin Start insulin sliding scale, sensitive Avoid hypoglycemia Start heart healthy, carb modified diet N.p.o. after midnight due to possible bronchoscopy with biopsy  Essential hypertension Resume home antihypertensives Monitor vital signs  Chronic anxiety Resume home medications  Hyperlipidemia Resume statin  Diabetic polyneuropathy Resume gabapentin  Coronary artery disease Hold off antiplatelet due to possible biopsy in the morning  COPD, normal oxygen supplementation at baseline Start nebulizers and bronchodilators as well as flutter valve Maintain O2 saturation greater than 92%  Tobacco use disorder Tobacco cessation counseled on at bedside Nicotine patch  Alcohol use disorder with concern for withdrawal Start CIWA protocol Start folate and thiamine vitamins Last alcohol intake, liquor was last night.  Hypokalemia Potassium 2.6 Replete potassium as necessary Obtain magnesium level Repeat BMP in the morning    DVT prophylaxis: Heparin drip  Code Status: DNR as confirmed by the patient himself who is alert oriented x4.  Family Communication: None at bedside  Disposition Plan: Admit to stepdown unit (progressive)  Consults called: None.  Please consult pulmonology in the morning for possible bronchoscopy and biopsy.  Admission status: Inpatient status.    Kayleen Memos MD [facility] Pager [phone]  If 7PM-7AM, please contact night-coverage [email or web address] Password [facility]  05/08/2019, 6:23 PM

## 2019-05-08 NOTE — Progress Notes (Addendum)
ANTICOAGULATION CONSULT NOTE - Initial Consult  Pharmacy Consult for Heparin Indication: SVC Clot  Allergies  Allergen Reactions  . Latex Swelling  . Oxycodone Other (See Comments)    Sedation/somnolence    Patient Measurements: Weight: 240 lb (108.9 kg) Heparin Dosing Weight: 98.5 kg  Vital Signs: Temp: 97.9 F (36.6 C) (09/17 1258) Temp Source: Temporal (09/17 1258) BP: 167/75 (09/17 1800) Pulse Rate: 87 (09/17 1800)  Labs: Recent Labs    05/08/19 1259 05/08/19 1433  HGB 14.4  --   HCT 44.7  --   PLT 46*  --   CREATININE 1.11  --   TROPONINIHS 31* 32*    Estimated Creatinine Clearance: 78.8 mL/min (by C-G formula based on SCr of 1.11 mg/dL).   Medical History: Past Medical History:  Diagnosis Date  . ALLERGIC RHINITIS 06/24/2007  . 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 ANXIETY 06/24/2007  . Arthritis    "hands, knees, hips, neck; q part of the body" (01/27/2015)  . CELLULITIS AND ABSCESS OF LEG EXCEPT FOOT 03/28/2010  . Cellulitis of left leg 01/27/2015  . CHF (congestive heart failure) ([place])   . Chronic lower back pain    "I have a steel rod in my back"  . Claudication ([place]) 06/02/2011  . COLONIC POLYPS, HX OF 06/24/2007  . Complication of anesthesia    "they can't put me asleep cause I don't wake up" (01/27/2015)  . CONSTIPATION, CHRONIC, HX OF 06/24/2007  . COPD (chronic obstructive pulmonary disease) ([place])   . Coronary artery disease   . CVA (cerebral vascular accident) [facility]) 1990's   "w/MI"; denies residual on 01/27/2015  . DEPRESSION 06/24/2007  . DIABETES MELLITUS, TYPE II dx'd 2015  . DIVERTICULOSIS, COLON 06/24/2007  . DVT (deep venous thrombosis) ([place])   . Heart murmur   . HYPERLIPIDEMIA 06/24/2007  . HYPERTENSION 06/24/2007  . INSOMNIA-SLEEP DISORDER-UNSPEC 01/24/2010  . Migraine    "comes over my right eye q now and then" (01/27/2015)  . Morbid obesity ([place]) 12/21/2015  . Myocardial infarction ([place]) 1990's X 1   "w/stroke"  . OSA (obstructive sleep apnea)   . SKIN  LESION 01/24/2010  . Unspecified Peripheral Vascular Disease 06/24/2007    Medications:  Scheduled:  . [START ON 05/09/2019] atorvastatin  40 mg Oral q1800  . dextromethorphan-guaiFENesin  2 tablet Oral BID  . fenofibrate  160 mg Oral Daily  . folic acid  1 mg Oral Daily  . gabapentin  200 mg Oral QHS  . insulin aspart  0-5 Units Subcutaneous QHS  . [START ON 05/09/2019] insulin aspart  0-9 Units Subcutaneous TID WC  . ipratropium-albuterol  3 mL Nebulization Q6H  . labetalol  100 mg Oral BID  . multivitamin with minerals  1 tablet Oral Daily  . nicotine  14 mg Transdermal Daily  . potassium chloride  40 mEq Oral BID  . thiamine  100 mg Oral Daily   Or  . thiamine  100 mg Intravenous Daily   Infusions:  . heparin 1,650 Units/hr (05/08/19 1850)  . piperacillin-tazobactam (ZOSYN)  IV    . potassium chloride 10 mEq (05/08/19 1850)    Assessment: 68 y.o. male presenting with chest pain and cough. No anticoagulation PTA. CT showed possible thrombus of the SVC and large right hilar mass. Patient will need a biopsy, date pending. Hgb 14.4, Plts 46. Per discussion with Dr. Jeanell Sparrow, okay to give full dose of heparin drip but no heparin bolus. Pharmacy consulted for heparin dosing  Goal of Therapy:  Heparin level 0.3-0.7 units/ml Monitor platelets by anticoagulation protocol: Yes   Plan:  No heparin bolus per discussion with MD and thrombocytopenia. Start Heparin gtt 1650 units/hr Check 6hr HL Daily HL and CBC Monitor s/sx of bleeding Monitor PLTs   Lorel Monaco, PharmD PGY1 Ambulatory Care Resident [employer] # [phone]

## 2019-05-08 NOTE — Consult Note (Signed)
NAME:  Eric [name], MRN:  [id number], DOB:  [birth year], LOS: 0 ADMISSION DATE:  05/08/2019, CONSULTATION DATE: 05/08/2019 REFERRING MD: Dr. Jeanell Sparrow, ED, CHIEF COMPLAINT: Abnormal CT chest  Brief History   69 year old active smoker with multiple medical problems admitted with chest pain, dyspnea, cough, weight loss following a mechanical fall.  CT chest consistent with metastatic primary lung cancer, possible thrombus at the brachiocephalic confluence, consider clot or tumor in the SVC.  History of present illness   68 year old obese active smoker (approximately 50 pack years) with a history of CAD, peripheral vascular disease, COPD, diabetes, OSA, degenerative disc disease.   He presented to the emergency department 9/16 with chest discomfort that started the night before.  Associated with shortness of breath.  He suffered an apparent mechanical fall after chest pain and already started.  No chest trauma noted.  He did hit his head and his left forearm.  Also notes that he has had cough.  No fevers, no chills, no known sick contacts or COVID exposures.  CT chest, abdomen revealed right hilar mass, metastatic disease, possible SVC intraluminal tumor versus clot, suspected brachiocephalic thrombus.  Past Medical History   has a past medical history of ALLERGIC RHINITIS (06/24/2007), Anal fistula (06/24/2007), ANXIETY (06/24/2007), Arthritis, CELLULITIS AND ABSCESS OF LEG EXCEPT FOOT (03/28/2010), Cellulitis of left leg (01/27/2015), CHF (congestive heart failure) ([place]), Chronic lower back pain, Claudication ([place]) (06/02/2011), COLONIC POLYPS, HX OF (04/23/2354), Complication of anesthesia, CONSTIPATION, CHRONIC, HX OF (06/24/2007), COPD (chronic obstructive pulmonary disease) ([place]), Coronary artery disease, CVA (cerebral vascular accident) ([place]) (7322'G), DEPRESSION (06/24/2007), DIABETES MELLITUS, TYPE II (dx'd 2015), DIVERTICULOSIS, COLON (06/24/2007), DVT (deep venous thrombosis) ([place]), Heart murmur,  HYPERLIPIDEMIA (06/24/2007), HYPERTENSION (06/24/2007), INSOMNIA-SLEEP DISORDER-UNSPEC (01/24/2010), Migraine, Morbid obesity ([place]) (12/21/2015), Myocardial infarction ([place]) (1990's X 1), OSA (obstructive sleep apnea), SKIN LESION (01/24/2010), and Unspecified Peripheral Vascular Disease (06/24/2007).   Significant Hospital Events     Consults:    Procedures:    Significant Diagnostic Tests:  CT chest, abdomen 9/17 >> reviewed by me, profoundly abnormal.  Shows a large right perihilar mass with encasement of his right mainstem bronchus, right pulmonary artery, extending down into the right lower lobe rhonchi and at least partially occluding the right upper lobe and right middle lobe airways.  There is leftward shift.  Multiple enlarged mediastinal lymph nodes, axillary lymph nodes.  He has bilateral adrenal gland masses consistent with metastases.  Numerous perinephric soft tissue mass lesions within the retroperitoneum peritoneal cavity.  Finally a soft tissue mass in the left gluteal musculature  Micro Data:  Blood 9/17 >>  SARS CoV2 9/17 >>   Antimicrobials:  Zosyn 9/17 >>   Interim history/subjective:    Objective   Blood pressure (!) 167/75, pulse 87, temperature 97.9 F (36.6 C), temperature source Temporal, resp. rate 20, weight 108.9 kg, SpO2 (!) 87 %.       No intake or output data in the 24 hours ending 05/08/19 1804 Filed Weights   05/08/19 1801  Weight: 108.9 kg    Examination: General: Ill-appearing obese man, lying in bed with partial O2 mask in place HENT: Oropharynx dry, no oral lesions.  Some scleral hemorrhages, no significant icterus.  Pupils equal Lungs: Clear on the left, very little air movement on the right with some coarse soft breath sounds Cardiovascular: Tachycardic, regular, no murmur Abdomen: Obese, soft, diffusely mildly tender but without any guarding or rebound.  Positive bowel sounds Extremities: Bilateral pretibial edema 1+ MSK: Palpable bilateral  axillary  lymphadenopathy, firm Neuro: Awake, interacts with short responses, answers all questions appropriately, follows commands Skin: Patient has widespread telangiectasias on the shoulders, face, chest  Resolved Hospital Problem list     Assessment & Plan:  Probable primary lung cancer with large right hilar mass, local metastatic disease, distant metastatic disease -Widely metastatic disease.  He will need a tissue diagnosis soon as possible.  Think the most amenable target would be his axillary lymph nodes.  This would be least invasive, most straightforward to obtain especially since he is going to be on anticoagulation.  I am confident that bronchoscopy would give a diagnosis as well, but would require conscious sedation, would carry more risk.  We could consider this if either axillary nodal biopsy or adrenal mass biopsy is not feasible.  Falls.  Patient actually notes that he has had multiple falls over the last several months. -Question whether he may have intracranial metastatic disease.  No mass seen on his head CT from 9/17.  He likely needs an MRI brain  Suspected right lower lobe postobstructive pneumonia -Empiric Zosyn started on 9/17, agree with this.  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  I think will be reasonable to empirically anticoagulate with heparin without a bolus until we can consider any other potential diagnostics that would help [place] determine whether clot is present.  His renal function would appear to be adequate, could probably tolerate angiography if we felt it was indicated -Obviously we will have to watch closely for any evidence of bleeding associated with a heparin.  He is at risk for hemoptysis, even pulmonary arterial bleeding given the involvement/encasement of his right PA with tumor.  Best practice:  Diet: Heart healthy, carb  modified Pain/Anxiety/Delirium protocol (if indicated): N/A VAP protocol (if indicated): N/A DVT prophylaxis: Heparin infusion GI prophylaxis: N/A Glucose control: As per primary service Mobility: Bedrest Code Status: Full Family Communication: Per primary service Disposition: Agree with progressive care admission  Labs   CBC: Recent Labs  Lab 05/08/19 1259  WBC 4.3  HGB 14.4  HCT 44.7  MCV 108.0*  PLT 46*    Basic Metabolic Panel: Recent Labs  Lab 05/08/19 1259 05/08/19 1433  NA 144  --   K 2.6*  --   CL 102  --   CO2 30  --   GLUCOSE 119*  --   BUN 22  --   CREATININE 1.11  --   CALCIUM 9.2  --   MG  --  2.0   GFR: Estimated Creatinine Clearance: 78.8 mL/min (by C-G formula based on SCr of 1.11 mg/dL). Recent Labs  Lab 05/08/19 1259 05/08/19 1422  WBC 4.3  --   LATICACIDVEN  --  1.8    Liver Function Tests: No results for input(s): AST, ALT, ALKPHOS, BILITOT, PROT, ALBUMIN in the last 168 hours. No results for input(s): LIPASE, AMYLASE in the last 168 hours. No results for input(s): AMMONIA in the last 168 hours.  ABG No results found for: PHART, PCO2ART, PO2ART, HCO3, TCO2, ACIDBASEDEF, O2SAT   Coagulation Profile: No results for input(s): INR, PROTIME in the last 168 hours.  Cardiac Enzymes: No results for input(s): CKTOTAL, CKMB, CKMBINDEX, TROPONINI in the last 168 hours.  HbA1C: Hemoglobin A1C  Date/Time Value Ref Range Status  07/23/2018 03:37 PM 5.9 (A) 4.0 - 5.6 % Final  01/04/2018 02:21 PM 6.0  Final   HbA1c, POC (  prediabetic range)  Date/Time Value Ref Range Status  07/23/2018 03:37 PM 0 (A) 5.7 - 6.4 % Final   HbA1c, POC (controlled diabetic range)  Date/Time Value Ref Range Status  07/23/2018 03:37 PM 0.0 0.0 - 7.0 % Final    Comment:    `   HbA1c POC (<> result, manual entry)  Date/Time Value Ref Range Status  07/23/2018 03:37 PM 0 4.0 - 5.6 % Final   Hgb A1c MFr Bld  Date/Time Value Ref Range Status  01/24/2019 10:47  AM 6.2 4.6 - 6.5 % Final    Comment:    Glycemic Control Guidelines for People with Diabetes:Non Diabetic:  <6%Goal of Therapy: <7%Additional Action Suggested:  >8%   08/31/2017 09:55 AM 5.7 4.6 - 6.5 % Final    Comment:    Glycemic Control Guidelines for People with Diabetes:Non Diabetic:  <6%Goal of Therapy: <7%Additional Action Suggested:  >8%     CBG: No results for input(s): GLUCAP in the last 168 hours.  Review of Systems:   Chest discomfort, overall weakness with falls as mentioned.  He has a 45 pound weight loss over the last 3 to 4 months.  Past Medical History  He,  has a past medical history of ALLERGIC RHINITIS (06/24/2007), Anal fistula (06/24/2007), ANXIETY (06/24/2007), Arthritis, CELLULITIS AND ABSCESS OF LEG EXCEPT FOOT (03/28/2010), Cellulitis of left leg (01/27/2015), CHF (congestive heart failure) ([place]), Chronic lower back pain, Claudication ([place]) (06/02/2011), COLONIC POLYPS, HX OF (56/04/7947), Complication of anesthesia, CONSTIPATION, CHRONIC, HX OF (06/24/2007), COPD (chronic obstructive pulmonary disease) ([place]), Coronary artery disease, CVA (cerebral vascular accident) ([place]) (0165'V), DEPRESSION (06/24/2007), DIABETES MELLITUS, TYPE II (dx'd 2015), DIVERTICULOSIS, COLON (06/24/2007), DVT (deep venous thrombosis) ([place]), Heart murmur, HYPERLIPIDEMIA (06/24/2007), HYPERTENSION (06/24/2007), INSOMNIA-SLEEP DISORDER-UNSPEC (01/24/2010), Migraine, Morbid obesity ([place]) (12/21/2015), Myocardial infarction ([place]) (1990's X 1), OSA (obstructive sleep apnea), SKIN LESION (01/24/2010), and Unspecified Peripheral Vascular Disease (06/24/2007).   Surgical History    Past Surgical History:  Procedure Laterality Date   ANTERIOR CERVICAL DECOMP/DISCECTOMY FUSION  29   "job related injury"   BACK SURGERY     KNEE ARTHROSCOPY Bilateral 1990   LIGAMENT REPAIR Right 1999   "wrist; job related injury"   LUMBAR LAMINECTOMY  1990's     Social History   reports that he has been smoking cigars. He  has smoked for the past 48.00 years. He has never used smokeless tobacco. He reports current alcohol use. He reports that he does not use drugs.   Family History   His family history includes COPD in his father; Cancer in his mother; Heart attack in his brother; Heart disease in his brother, father, maternal grandfather, and maternal grandmother; Hypertension in his brother; Peripheral vascular disease in his brother.   Allergies Allergies  Allergen Reactions   Latex Swelling   Oxycodone Other (See Comments)    Sedation/somnolence     Home Medications  Prior to Admission medications   Medication Sig Start Date End Date Taking? Authorizing Provider  aspirin 81 MG tablet Take 81 mg by mouth daily.     Yes [provider]  Blood Glucose Monitoring Suppl DEVI 1 Device by Does not apply route daily. 02/10/15  Yes Biagio Borg, MD  clonazePAM (KLONOPIN) 0.5 MG tablet TAKE 1-2 TABLET BY MOUTH AT BEDTIME, CAN TAKE UP TO 2 DEPENDING ON ANXIETY Patient taking differently: Take 0.25 mg by mouth 2 (two) times daily as needed for anxiety.  02/19/19  Yes Biagio Borg, MD  fenofibrate 160 MG  tablet TAKE 1 TABLET BY MOUTH EVERY DAY 01/30/19  Yes Biagio Borg, MD  furosemide (LASIX) 20 MG tablet Take 0.5 tablets (10 mg total) by mouth daily. Patient taking differently: Take 20 mg by mouth daily.  09/12/17  Yes Biagio Borg, MD  gabapentin (NEURONTIN) 100 MG capsule TAKE 2 CAPSULES BY MOUTH EVERY DAY AT BEDTIME Patient taking differently: Take 200 mg by mouth at bedtime.  03/10/19  Yes Biagio Borg, MD  glucose blood test strip Use as instructed 02/10/15  Yes Biagio Borg, MD  hydrochlorothiazide (HYDRODIURIL) 25 MG tablet TAKE 1 TABLET BY MOUTH EVERY DAY 03/24/19  Yes Biagio Borg, MD  labetalol (NORMODYNE) 300 MG tablet TAKE 1 TABLET BY MOUTH TWICE A DAY 04/22/19  Yes Biagio Borg, MD  Lancets (ACCU-CHEK SOFT TOUCH) lancets Use as instructed 01/25/17  Yes Biagio Borg, MD  metFORMIN (GLUCOPHAGE)  1000 MG tablet TAKE 1 TABLET BY MOUTH 2 TIMES DAILY WITH A MEAL. Patient taking differently: Take 500 mg by mouth 2 (two) times daily.  04/14/19  Yes Biagio Borg, MD  simvastatin (ZOCOR) 80 MG tablet TAKE 1 TABLET BY MOUTH EVERYDAY AT BEDTIME Patient taking differently: Take 80 mg by mouth at bedtime.  02/03/19  Yes Biagio Borg, MD  traZODone (DESYREL) 50 MG tablet TAKE 1/2 TO 1 TABLET AT BEDTIME AS NEEDED FOR SLEEP Patient taking differently: Take 50 mg by mouth at bedtime.  04/01/19  Yes Lyndal Pulley, DO  Vitamin D, Ergocalciferol, (DRISDOL) 1.25 MG (50000 UT) CAPS capsule Take 1 capsule (50,000 Units total) by mouth every 7 (seven) days. 01/24/19  Yes Biagio Borg, MD  Blood Glucose Monitoring Suppl (ACCU-CHEK NANO SMARTVIEW) w/Device KIT use as directed to check insulin levels 03/27/17   Biagio Borg, MD     Baltazar Apo, MD, PhD 05/08/2019, 6:43 PM [facility] [phone] or if no answer [phone]

## 2019-05-09 ENCOUNTER — Telehealth: Payer: Self-pay | Admitting: *Deleted

## 2019-05-09 ENCOUNTER — Other Ambulatory Visit: Payer: Self-pay | Admitting: Internal Medicine

## 2019-05-09 NOTE — Telephone Encounter (Signed)
The vitamin D should not be related to his problems mentioned  OK for [facility] referral - done

## 2019-05-09 NOTE — Telephone Encounter (Signed)
Notified pt wife MD place referral for [facility] services and someone should be giving them a call soon.Marland KitchenJohny [name]

## 2019-05-09 NOTE — Telephone Encounter (Signed)
Pt was on TCM report admitted for observation 05/08/19 for CP and Fall. Called pt/wife to make follow=up appt w/MD. Per wife she is not able to get him here, nor can she do an virtual. Wife states husband actually sign himself out of the hospital yesterday. Wife states pt doesn't have any control over his kidneys or bowels. She also questioning if the Vitamin D 50,000 that pt was taking maybe causing the loose bowels. Inform wife I'm not able to answer that question but will send info to Dr. Jenny Reichmann. Wife states she is just notable to take care of him by herself. She states after getting home from the hospital he fell again. She states after next Thurs after she is able to get her license, and to see how he is doing then she maybe bring him in, but not sure. Would this pt qualify for [facility] services to help out...Johny Chess

## 2019-05-10 LAB — HEMOGLOBIN A1C
Hgb A1c MFr Bld: 5.6 % (ref 4.8–5.6)
Mean Plasma Glucose: 114 mg/dL

## 2019-05-12 ENCOUNTER — Other Ambulatory Visit: Payer: Self-pay

## 2019-05-12 NOTE — Patient Outreach (Signed)
[facility]) Care Management  05/12/2019  Eric [name] 03/15/1951 [id number]   Telephone Screen  Referral Date: 05/09/2019 Referral Source: MD Office Referral Reason: "Freq falls, COPD" Insurance:[employer] Medicare   Outreach attempt #1 to patient. No answer. RN CM left HIPAA compliant voicemail message along with contact info.     Plan: RN CM will make outreach attempt to patient within 3-4 business days. RN CM will send unsuccessful outreach letter to patient.   Enzo Montgomery, RN,BSN,CCM [place] Management Telephonic Care Management Coordinator Direct Phone: [phone] Toll Free: [phone] Fax: [phone]

## 2019-05-13 LAB — CULTURE, BLOOD (ROUTINE X 2)
Culture: NO GROWTH
Culture: NO GROWTH

## 2019-05-15 ENCOUNTER — Other Ambulatory Visit: Payer: Self-pay

## 2019-05-15 NOTE — Patient Outreach (Signed)
[facility]) Care Management  05/15/2019  AIRON SAHNI [birth year] [id number]   Telephone Screen  Referral Date: 05/09/2019 Referral Source: MD Office Referral Reason: "Freq falls, COPD" Insurance:[employer] Medicare   Outreach attempt #2 to patient. No answer at present.     Plan: RN CM will make outreach attempt to patient within 3-4 business days.  Enzo Montgomery, RN,BSN,CCM [place] Management Telephonic Care Management Coordinator Direct Phone: [phone] Toll Free: [phone] Fax: [phone]

## 2019-05-19 ENCOUNTER — Ambulatory Visit: Payer: Self-pay | Admitting: *Deleted

## 2019-05-19 NOTE — Telephone Encounter (Signed)
This encounter was created in error - please disregard.

## 2019-05-19 NOTE — Telephone Encounter (Addendum)
Pt's wife will try to get the pt to go to the ED. She states patient is not willing to be seen after leaving the ED on 05/08/19 against medical advice.

## 2019-05-19 NOTE — Telephone Encounter (Signed)
I have nothing else to offer without OV; pt should be taken to ED for persistent or worsening symptoms

## 2019-05-19 NOTE — Telephone Encounter (Signed)
Talking with Mrs. Ratcliffe. Bilateral foot edema up to the calf .Feet are dark almost purple and cool to touch. Seen in ED on 17th but left against medical advice.The swelling is not decreasing any overnight any longer. Going to bathroom every hour not able to completely control bladder/bowels. Hurts all over. Attempts to walk but falls often. Stated he has COPD and occasionally has shortness of breath but not on any treatments for that. She is unsure of all of his medications and what they are for. Feels he is no longer able to discern what he needs. Refuses to come in for appointment. Sounds though she is overwhelmed and unsure how to help him. Stated he was told at the last ED visit that he had cancer and nothing could be done to help him. She knows nothing of this diagnosis.  Advised ER care patient refusing to go. Routing to provider for further advice.  Reason for Disposition . Patient sounds very sick or weak to the triager  Answer Assessment - Initial Assessment Questions 1. ONSET: "When did the swelling start?" (e.g., minutes, hours, days)      About the 15th of September. 2. LOCATION: "What part of the leg is swollen?"  "Are both legs swollen or just one leg?"     Feet ankles and lower legs 3. SEVERITY: "How bad is the swelling?" (e.g., localized; mild, moderate, severe)  - Localized - small area of swelling localized to one leg  - MILD pedal edema - swelling limited to foot and ankle, pitting edema < 1/4 inch (6 mm) deep, rest and elevation eliminate most or all swelling  - MODERATE edema - swelling of lower leg to knee, pitting edema > 1/4 inch (6 mm) deep, rest and elevation only partially reduce swelling  - SEVERE edema - swelling extends above knee, facial or hand swelling present     Moderate. Up to the calf of both legs 4. REDNESS: "Does the swelling look red or infected?"     Dark red 5. PAIN: "Is the swelling painful to touch?" If so, ask: "How painful is it?"   (Scale 1-10; mild,  moderate or severe)     sometimes 6. FEVER: "Do you have a fever?" If so, ask: "What is it, how was it measured, and when did it start?"      no 7. CAUSE: "What do you think is causing the leg swelling?"    Fluid retention 8. MEDICAL HISTORY: "Do you have a history of heart failure, kidney disease, liver failure, or cancer?"     COPD the rest she is unsure.  9. RECURRENT SYMPTOM: "Have you had leg swelling before?" If so, ask: "When was the last time?" "What happened that time?"     yes 10. OTHER SYMPTOMS: "Do you have any other symptoms?" (e.g., chest pain, difficulty breathing)       occasional 11. PREGNANCY: "Is there any chance you are pregnant?" "When was your last menstrual period?"       na  Protocols used: LEG SWELLING AND EDEMA-A-AH

## 2019-05-20 ENCOUNTER — Inpatient Hospital Stay (HOSPITAL_COMMUNITY)
Admission: EM | Admit: 2019-05-20 | Discharge: 2019-06-22 | DRG: 180 | Disposition: E | Payer: Medicare Other | Attending: Internal Medicine | Admitting: Internal Medicine

## 2019-05-20 ENCOUNTER — Emergency Department (HOSPITAL_COMMUNITY): Payer: Medicare Other

## 2019-05-20 ENCOUNTER — Ambulatory Visit: Payer: Self-pay

## 2019-05-20 ENCOUNTER — Other Ambulatory Visit: Payer: Self-pay

## 2019-05-20 DIAGNOSIS — G4733 Obstructive sleep apnea (adult) (pediatric): Secondary | ICD-10-CM | POA: Diagnosis present

## 2019-05-20 DIAGNOSIS — R079 Chest pain, unspecified: Secondary | ICD-10-CM | POA: Diagnosis not present

## 2019-05-20 DIAGNOSIS — Z7189 Other specified counseling: Secondary | ICD-10-CM

## 2019-05-20 DIAGNOSIS — Z7984 Long term (current) use of oral hypoglycemic drugs: Secondary | ICD-10-CM

## 2019-05-20 DIAGNOSIS — R531 Weakness: Secondary | ICD-10-CM

## 2019-05-20 DIAGNOSIS — J189 Pneumonia, unspecified organism: Secondary | ICD-10-CM | POA: Diagnosis present

## 2019-05-20 DIAGNOSIS — R0602 Shortness of breath: Secondary | ICD-10-CM | POA: Diagnosis not present

## 2019-05-20 DIAGNOSIS — C3491 Malignant neoplasm of unspecified part of right bronchus or lung: Principal | ICD-10-CM

## 2019-05-20 DIAGNOSIS — R609 Edema, unspecified: Secondary | ICD-10-CM | POA: Diagnosis not present

## 2019-05-20 DIAGNOSIS — S0990XA Unspecified injury of head, initial encounter: Secondary | ICD-10-CM | POA: Diagnosis not present

## 2019-05-20 DIAGNOSIS — J9601 Acute respiratory failure with hypoxia: Secondary | ICD-10-CM | POA: Diagnosis not present

## 2019-05-20 DIAGNOSIS — R600 Localized edema: Secondary | ICD-10-CM | POA: Diagnosis present

## 2019-05-20 DIAGNOSIS — E876 Hypokalemia: Secondary | ICD-10-CM | POA: Diagnosis not present

## 2019-05-20 DIAGNOSIS — Z7982 Long term (current) use of aspirin: Secondary | ICD-10-CM

## 2019-05-20 DIAGNOSIS — R45851 Suicidal ideations: Secondary | ICD-10-CM | POA: Diagnosis present

## 2019-05-20 DIAGNOSIS — J440 Chronic obstructive pulmonary disease with acute lower respiratory infection: Secondary | ICD-10-CM | POA: Diagnosis not present

## 2019-05-20 DIAGNOSIS — Z20828 Contact with and (suspected) exposure to other viral communicable diseases: Secondary | ICD-10-CM | POA: Diagnosis present

## 2019-05-20 DIAGNOSIS — R000 Tachycardia, unspecified: Secondary | ICD-10-CM | POA: Diagnosis not present

## 2019-05-20 DIAGNOSIS — R0902 Hypoxemia: Secondary | ICD-10-CM | POA: Diagnosis not present

## 2019-05-20 DIAGNOSIS — R1310 Dysphagia, unspecified: Secondary | ICD-10-CM | POA: Diagnosis not present

## 2019-05-20 DIAGNOSIS — I252 Old myocardial infarction: Secondary | ICD-10-CM

## 2019-05-20 DIAGNOSIS — I160 Hypertensive urgency: Secondary | ICD-10-CM | POA: Diagnosis present

## 2019-05-20 DIAGNOSIS — I82210 Acute embolism and thrombosis of superior vena cava: Secondary | ICD-10-CM | POA: Diagnosis not present

## 2019-05-20 DIAGNOSIS — W19XXXA Unspecified fall, initial encounter: Secondary | ICD-10-CM | POA: Diagnosis not present

## 2019-05-20 DIAGNOSIS — C7971 Secondary malignant neoplasm of right adrenal gland: Secondary | ICD-10-CM | POA: Diagnosis not present

## 2019-05-20 DIAGNOSIS — R918 Other nonspecific abnormal finding of lung field: Secondary | ICD-10-CM | POA: Diagnosis present

## 2019-05-20 DIAGNOSIS — D696 Thrombocytopenia, unspecified: Secondary | ICD-10-CM | POA: Diagnosis present

## 2019-05-20 DIAGNOSIS — I82290 Acute embolism and thrombosis of other thoracic veins: Secondary | ICD-10-CM | POA: Diagnosis not present

## 2019-05-20 DIAGNOSIS — C7972 Secondary malignant neoplasm of left adrenal gland: Secondary | ICD-10-CM | POA: Diagnosis present

## 2019-05-20 DIAGNOSIS — Z66 Do not resuscitate: Secondary | ICD-10-CM | POA: Diagnosis not present

## 2019-05-20 DIAGNOSIS — Z8249 Family history of ischemic heart disease and other diseases of the circulatory system: Secondary | ICD-10-CM

## 2019-05-20 DIAGNOSIS — Z801 Family history of malignant neoplasm of trachea, bronchus and lung: Secondary | ICD-10-CM

## 2019-05-20 DIAGNOSIS — N1330 Unspecified hydronephrosis: Secondary | ICD-10-CM | POA: Diagnosis not present

## 2019-05-20 DIAGNOSIS — E669 Obesity, unspecified: Secondary | ICD-10-CM | POA: Diagnosis present

## 2019-05-20 DIAGNOSIS — F17210 Nicotine dependence, cigarettes, uncomplicated: Secondary | ICD-10-CM | POA: Diagnosis present

## 2019-05-20 DIAGNOSIS — Z638 Other specified problems related to primary support group: Secondary | ICD-10-CM

## 2019-05-20 DIAGNOSIS — E1151 Type 2 diabetes mellitus with diabetic peripheral angiopathy without gangrene: Secondary | ICD-10-CM | POA: Diagnosis present

## 2019-05-20 DIAGNOSIS — I8290 Acute embolism and thrombosis of unspecified vein: Secondary | ICD-10-CM | POA: Diagnosis present

## 2019-05-20 DIAGNOSIS — Z825 Family history of asthma and other chronic lower respiratory diseases: Secondary | ICD-10-CM

## 2019-05-20 DIAGNOSIS — J449 Chronic obstructive pulmonary disease, unspecified: Secondary | ICD-10-CM | POA: Diagnosis not present

## 2019-05-20 DIAGNOSIS — C786 Secondary malignant neoplasm of retroperitoneum and peritoneum: Secondary | ICD-10-CM | POA: Diagnosis present

## 2019-05-20 DIAGNOSIS — R319 Hematuria, unspecified: Secondary | ICD-10-CM | POA: Diagnosis present

## 2019-05-20 DIAGNOSIS — F419 Anxiety disorder, unspecified: Secondary | ICD-10-CM | POA: Diagnosis present

## 2019-05-20 DIAGNOSIS — I714 Abdominal aortic aneurysm, without rupture, unspecified: Secondary | ICD-10-CM | POA: Diagnosis present

## 2019-05-20 DIAGNOSIS — I509 Heart failure, unspecified: Secondary | ICD-10-CM | POA: Diagnosis not present

## 2019-05-20 DIAGNOSIS — Z91040 Latex allergy status: Secondary | ICD-10-CM

## 2019-05-20 DIAGNOSIS — F4321 Adjustment disorder with depressed mood: Secondary | ICD-10-CM | POA: Diagnosis present

## 2019-05-20 DIAGNOSIS — Z981 Arthrodesis status: Secondary | ICD-10-CM

## 2019-05-20 DIAGNOSIS — I110 Hypertensive heart disease with heart failure: Secondary | ICD-10-CM | POA: Diagnosis not present

## 2019-05-20 DIAGNOSIS — Z86010 Personal history of colonic polyps: Secondary | ICD-10-CM

## 2019-05-20 DIAGNOSIS — F05 Delirium due to known physiological condition: Secondary | ICD-10-CM | POA: Diagnosis present

## 2019-05-20 DIAGNOSIS — E785 Hyperlipidemia, unspecified: Secondary | ICD-10-CM | POA: Diagnosis present

## 2019-05-20 DIAGNOSIS — Z515 Encounter for palliative care: Secondary | ICD-10-CM | POA: Diagnosis not present

## 2019-05-20 DIAGNOSIS — Z6830 Body mass index (BMI) 30.0-30.9, adult: Secondary | ICD-10-CM

## 2019-05-20 DIAGNOSIS — I2510 Atherosclerotic heart disease of native coronary artery without angina pectoris: Secondary | ICD-10-CM | POA: Diagnosis present

## 2019-05-20 DIAGNOSIS — I10 Essential (primary) hypertension: Secondary | ICD-10-CM | POA: Diagnosis not present

## 2019-05-20 DIAGNOSIS — M170 Bilateral primary osteoarthritis of knee: Secondary | ICD-10-CM | POA: Diagnosis present

## 2019-05-20 DIAGNOSIS — F17290 Nicotine dependence, other tobacco product, uncomplicated: Secondary | ICD-10-CM | POA: Diagnosis present

## 2019-05-20 DIAGNOSIS — Z79899 Other long term (current) drug therapy: Secondary | ICD-10-CM

## 2019-05-20 DIAGNOSIS — Z8673 Personal history of transient ischemic attack (TIA), and cerebral infarction without residual deficits: Secondary | ICD-10-CM

## 2019-05-20 DIAGNOSIS — R296 Repeated falls: Secondary | ICD-10-CM | POA: Diagnosis present

## 2019-05-20 DIAGNOSIS — Z885 Allergy status to narcotic agent status: Secondary | ICD-10-CM

## 2019-05-20 LAB — CBC WITH DIFFERENTIAL/PLATELET
Abs Immature Granulocytes: 0.02 10*3/uL (ref 0.00–0.07)
Basophils Absolute: 0 10*3/uL (ref 0.0–0.1)
Basophils Relative: 0 %
Eosinophils Absolute: 0 10*3/uL (ref 0.0–0.5)
Eosinophils Relative: 0 %
HCT: 43.3 % (ref 39.0–52.0)
Hemoglobin: 14.1 g/dL (ref 13.0–17.0)
Immature Granulocytes: 1 %
Lymphocytes Relative: 5 %
Lymphs Abs: 0.2 10*3/uL — ABNORMAL LOW (ref 0.7–4.0)
MCH: 34.6 pg — ABNORMAL HIGH (ref 26.0–34.0)
MCHC: 32.6 g/dL (ref 30.0–36.0)
MCV: 106.4 fL — ABNORMAL HIGH (ref 80.0–100.0)
Monocytes Absolute: 0.2 10*3/uL (ref 0.1–1.0)
Monocytes Relative: 4 %
Neutro Abs: 3.6 10*3/uL (ref 1.7–7.7)
Neutrophils Relative %: 90 %
Platelets: 31 10*3/uL — ABNORMAL LOW (ref 150–400)
RBC: 4.07 MIL/uL — ABNORMAL LOW (ref 4.22–5.81)
RDW: 14.5 % (ref 11.5–15.5)
WBC: 4 10*3/uL (ref 4.0–10.5)
nRBC: 0 % (ref 0.0–0.2)

## 2019-05-20 LAB — COMPREHENSIVE METABOLIC PANEL
ALT: 28 U/L (ref 0–44)
AST: 24 U/L (ref 15–41)
Albumin: 3.5 g/dL (ref 3.5–5.0)
Alkaline Phosphatase: 47 U/L (ref 38–126)
Anion gap: 11 (ref 5–15)
BUN: 35 mg/dL — ABNORMAL HIGH (ref 8–23)
CO2: 31 mmol/L (ref 22–32)
Calcium: 9.2 mg/dL (ref 8.9–10.3)
Chloride: 102 mmol/L (ref 98–111)
Creatinine, Ser: 1.34 mg/dL — ABNORMAL HIGH (ref 0.61–1.24)
GFR calc Af Amer: >60 mL/min (ref >60)
GFR calc non Af Amer: 54 mL/min — ABNORMAL LOW (ref >60)
Glucose, Bld: 105 mg/dL — ABNORMAL HIGH (ref 70–99)
Potassium: 2.1 mmol/L — CL (ref 3.5–5.1)
Sodium: 144 mmol/L (ref 135–145)
Total Bilirubin: 1 mg/dL (ref 0.3–1.2)
Total Protein: 5.5 g/dL — ABNORMAL LOW (ref 6.5–8.1)

## 2019-05-20 LAB — STREP PNEUMONIAE URINARY ANTIGEN: Strep Pneumo Urinary Antigen: NEGATIVE

## 2019-05-20 LAB — URINALYSIS, ROUTINE W REFLEX MICROSCOPIC
Bacteria, UA: NONE SEEN
Bilirubin Urine: NEGATIVE
Glucose, UA: NEGATIVE mg/dL
Ketones, ur: NEGATIVE mg/dL
Leukocytes,Ua: NEGATIVE
Nitrite: NEGATIVE
Protein, ur: 30 mg/dL — AB
Specific Gravity, Urine: 1.019 (ref 1.005–1.030)
pH: 6 (ref 5.0–8.0)

## 2019-05-20 LAB — MAGNESIUM: Magnesium: 2.2 mg/dL (ref 1.7–2.4)

## 2019-05-20 LAB — BRAIN NATRIURETIC PEPTIDE: B Natriuretic Peptide: 269.3 pg/mL — ABNORMAL HIGH (ref 0.0–100.0)

## 2019-05-20 LAB — HEPARIN LEVEL (UNFRACTIONATED): Heparin Unfractionated: 0.58 IU/mL (ref 0.30–0.70)

## 2019-05-20 LAB — SARS CORONAVIRUS 2 BY RT PCR (HOSPITAL ORDER, PERFORMED IN ~~LOC~~ HOSPITAL LAB): SARS Coronavirus 2: NEGATIVE

## 2019-05-20 LAB — HIV ANTIBODY (ROUTINE TESTING W REFLEX): HIV Screen 4th Generation wRfx: NONREACTIVE

## 2019-05-20 LAB — CBG MONITORING, ED: Glucose-Capillary: 108 mg/dL — ABNORMAL HIGH (ref 70–99)

## 2019-05-20 LAB — PROCALCITONIN: Procalcitonin: 8.63 ng/mL

## 2019-05-20 MED ORDER — ACETAMINOPHEN 325 MG PO TABS: 650.0000 mg | ORAL_TABLET | Freq: Four times a day (QID) | ORAL | Status: DC | PRN

## 2019-05-20 MED ORDER — SODIUM CHLORIDE 0.9% FLUSH
3.0000 mL | Freq: Two times a day (BID) | INTRAVENOUS | Status: DC
  Administered 2019-05-21 – 2019-05-23 (×6): 3 mL via INTRAVENOUS

## 2019-05-20 MED ORDER — INSULIN ASPART 100 UNIT/ML ~~LOC~~ SOLN: 0.0000 [IU] | Freq: Every day | SUBCUTANEOUS | Status: DC

## 2019-05-20 MED ORDER — MORPHINE SULFATE (PF) 2 MG/ML IV SOLN
2.0000 mg | INTRAVENOUS | Status: DC | PRN
  Administered 2019-05-20 – 2019-05-23 (×11): 2 mg via INTRAVENOUS
  Filled 2019-05-20 (×11): qty 1

## 2019-05-20 MED ORDER — POTASSIUM CHLORIDE 20 MEQ/15ML (10%) PO SOLN
40.0000 meq | Freq: Once | ORAL | Status: AC
  Administered 2019-05-20: 40 meq via ORAL
  Filled 2019-05-20: qty 30

## 2019-05-20 MED ORDER — POTASSIUM CHLORIDE 10 MEQ/100ML IV SOLN
10.0000 meq | INTRAVENOUS | Status: AC
  Administered 2019-05-20 (×6): 10 meq via INTRAVENOUS
  Filled 2019-05-20 (×6): qty 100

## 2019-05-20 MED ORDER — MORPHINE SULFATE (PF) 4 MG/ML IV SOLN
4.0000 mg | INTRAVENOUS | Status: AC
  Administered 2019-05-20: 4 mg via INTRAVENOUS
  Filled 2019-05-20: qty 1

## 2019-05-20 MED ORDER — LABETALOL HCL 200 MG PO TABS
300.0000 mg | ORAL_TABLET | Freq: Two times a day (BID) | ORAL | Status: DC
  Administered 2019-05-21: 01:00:00 300 mg via ORAL
  Filled 2019-05-20: qty 2

## 2019-05-20 MED ORDER — CLONAZEPAM 0.5 MG PO TABS
0.2500 mg | ORAL_TABLET | Freq: Every day | ORAL | Status: DC
  Administered 2019-05-21: 0.25 mg via ORAL
  Filled 2019-05-20: qty 1

## 2019-05-20 MED ORDER — FENOFIBRATE 160 MG PO TABS
160.0000 mg | ORAL_TABLET | Freq: Every day | ORAL | Status: DC
  Filled 2019-05-20: qty 1

## 2019-05-20 MED ORDER — INSULIN ASPART 100 UNIT/ML ~~LOC~~ SOLN
0.0000 [IU] | Freq: Three times a day (TID) | SUBCUTANEOUS | Status: DC
  Administered 2019-05-21 – 2019-05-22 (×2): 1 [IU] via SUBCUTANEOUS

## 2019-05-20 MED ORDER — ALBUTEROL SULFATE (2.5 MG/3ML) 0.083% IN NEBU: 2.5000 mg | INHALATION_SOLUTION | Freq: Four times a day (QID) | RESPIRATORY_TRACT | Status: DC | PRN

## 2019-05-20 MED ORDER — HEPARIN (PORCINE) 25000 UT/250ML-% IV SOLN
1250.0000 [IU]/h | INTRAVENOUS | Status: DC
  Administered 2019-05-20 – 2019-05-21 (×2): 1800 [IU]/h via INTRAVENOUS
  Administered 2019-05-21: 1300 [IU]/h via INTRAVENOUS
  Filled 2019-05-20 (×4): qty 250

## 2019-05-20 MED ORDER — NICOTINE 21 MG/24HR TD PT24
21.0000 mg | MEDICATED_PATCH | Freq: Every day | TRANSDERMAL | Status: DC
  Administered 2019-05-20 – 2019-05-23 (×2): 21 mg via TRANSDERMAL
  Filled 2019-05-20 (×2): qty 1

## 2019-05-20 MED ORDER — ACETAMINOPHEN 650 MG RE SUPP: 650.0000 mg | Freq: Four times a day (QID) | RECTAL | Status: DC | PRN

## 2019-05-20 MED ORDER — HEPARIN BOLUS VIA INFUSION
3000.0000 [IU] | Freq: Once | INTRAVENOUS | Status: AC
  Administered 2019-05-20: 3000 [IU] via INTRAVENOUS
  Filled 2019-05-20: qty 3000

## 2019-05-20 MED ORDER — ATORVASTATIN CALCIUM 40 MG PO TABS: 40.0000 mg | ORAL_TABLET | Freq: Every day | ORAL | Status: DC

## 2019-05-20 NOTE — ED Notes (Signed)
Patient transported to CT 

## 2019-05-20 NOTE — Progress Notes (Addendum)
ANTICOAGULATION CONSULT NOTE - Follow-up Consult  Pharmacy Consult for Heparin Indication: SVC thrombus  Allergies  Allergen Reactions  . Latex Swelling  . Oxycodone Other (See Comments)    Sedation/somnolence    Patient Measurements: Height: 6\' 1"  (185.4 cm) Weight: 226 lb (102.5 kg) IBW/kg (Calculated) : 79.9 Heparin Dosing Weight: 100.7 kg  Vital Signs: Temp: 98.3 F (36.8 C) (09/29 0953) Temp Source: Oral (09/29 0953) BP: 178/87 (09/29 1930) Pulse Rate: 83 (09/29 1930)  Labs: Recent Labs    04/27/2019 1012 04/23/2019 1930  HGB 14.1  --   HCT 43.3  --   PLT 31*  --   HEPARINUNFRC  --  0.58  CREATININE 1.34*  --     Estimated Creatinine Clearance: 65.4 mL/min (A) (by C-G formula based on SCr of 1.34 mg/dL (H)).   Medical History: Past Medical History:  Diagnosis Date  . ALLERGIC RHINITIS 06/24/2007  . 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 ANXIETY 06/24/2007  . Arthritis    "hands, knees, hips, neck; q part of the body" (01/27/2015)  . CELLULITIS AND ABSCESS OF LEG EXCEPT FOOT 03/28/2010  . Cellulitis of left leg 01/27/2015  . CHF (congestive heart failure) ([place])   . Chronic lower back pain    "I have a steel rod in my back"  . Claudication ([place]) 06/02/2011  . COLONIC POLYPS, HX OF 06/24/2007  . Complication of anesthesia    "they can't put me asleep cause I don't wake up" (01/27/2015)  . CONSTIPATION, CHRONIC, HX OF 06/24/2007  . COPD (chronic obstructive pulmonary disease) ([place])   . Coronary artery disease   . CVA (cerebral vascular accident) [facility]) 1990's   "w/MI"; denies residual on 01/27/2015  . DEPRESSION 06/24/2007  . DIABETES MELLITUS, TYPE II dx'd 2015  . DIVERTICULOSIS, COLON 06/24/2007  . DVT (deep venous thrombosis) ([place])   . Heart murmur   . HYPERLIPIDEMIA 06/24/2007  . HYPERTENSION 06/24/2007  . INSOMNIA-SLEEP DISORDER-UNSPEC 01/24/2010  . Migraine    "comes over my right eye q now and then" (01/27/2015)  . Morbid obesity ([place]) 12/21/2015  . Myocardial infarction ([place])  1990's X 1   "w/stroke"  . OSA (obstructive sleep apnea)   . SKIN LESION 01/24/2010  . Unspecified Peripheral Vascular Disease 06/24/2007    Medications:  Scheduled:  . atorvastatin  40 mg Oral q1800  . clonazePAM  0.25 mg Oral QHS  . fenofibrate  160 mg Oral Daily  . insulin aspart  0-9 Units Subcutaneous TID WC  . labetalol  300 mg Oral BID  . nicotine  21 mg Transdermal Daily  . sodium chloride flush  3 mL Intravenous Q12H   Infusions:  . heparin Stopped (04/23/2019 1925)    Assessment: 68 y.o. male presenting with weakness. PMH significant for HTN, HLD, COPD, CVA, DVT, remote history of tobacco use, arthritis, and morbid obesity. No anticoagulation PTA. 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 Hgb 14.1, Scr 1.34. Platelets are chronically low (~60-80s), however Plts are lower than usual at 31 (MD aware) - will dose heparin bolus on the conservative range.   First HL therapeutic at 0.58  Goal of Therapy:   Heparin level 0.3-0.7 units/ml Monitor platelets by anticoagulation protocol: Yes   Plan:  Continue Heparin gtt 1800 units/hr Check 6hr cHL Daily HL and CBC Monitor s/sx of bleeding and platelets   Lorel Monaco, PharmD PGY1 Ambulatory Care Resident [employer] # [phone]

## 2019-05-20 NOTE — ED Notes (Signed)
SDU   Ordered hospital bed 

## 2019-05-20 NOTE — H&P (Signed)
History and Physical    Eric [name] DOB: Sep 08, 1950 DOA: 05/09/2019  Referring MD/NP/PA: Harrell Gave lawyer, PA-C PCP: Biagio Borg, MD  Patient coming from: Home via EMS  Chief Complaint: Slid out of my chair  I have personally briefly reviewed patient's old medical records in [place]   HPI: Eric [name] is a 68 y.o. male with medical history significant of HTN, HLD, COPD, CVA, diabetes mellitus type 2, DVT, remote history of tobacco use, arthritis, and morbid obesity; who presents with complaints of worsening weakness.  He was seen 2 weeks ago in the emergency department for cough and chest pain after having a fall at home.  CT scan of the head, chest, and abdomen revealed large right perihilar mass with mediastinal invasion, narrowing, and encasement of the right pulmonary artery and right hilar vessels.  Possible thrombus in the brachiocephalic confluence with marked narrowing and/or tumor thrombus of the SVC.  Suspicious airspace disease also noted.  Bilateral adrenal gland masses concerning for metastatic disease.  Indeterminate bilateral renal lesions with mild to moderate right hydronephrosis secondary to obstruction at the proximal right ureter at the level of the UPJ. Pulmonology had been consulted and it was recommended start patient on anticoagulation.  However, the patient left against medical advice.   Since being home patient reports that he has had multiple falls.  He complains of chest pain, worsening shortness of breath, lower extremity swelling, and persistent cough.  He reports that he has had progressive weight loss over last 6 months and shortness of breath over the last 3 months.  He had slid out of his chair this morning was unable to get back up.  Denies any injuries, but EMS was called thereafter by his wife.  Denies having any injuries.  In route with EMS patient was given 1 sublingual nitroglycerin and his blood pressures were noted to be  elevated into the 200s.  He does admit to still smoking half pack cigarettes per day on average.   ED Course: On admission into the emergency department patient was noted to be afebrile, pulse 76-81, respirations 10-22, blood pressure elevated up to 186/86, O2 saturations as low as 87% on room air with improvement of 2 L nasal cannula oxygen.  Labs significant for platelets 31, sodium 144, potassium 2.1, BUN 35, and creatinine 1.34.  Urinalysis was negative for any acute abnormality.  Patient was given 40 mEq of potassium chloride p.o., potassium chloride 60 mEq IV, and started on a heparin drip.  Review of Systems  Constitutional: Positive for malaise/fatigue. Negative for fever.  HENT: Negative for congestion and nosebleeds.   Eyes: Negative for photophobia and pain.  Respiratory: Positive for cough and shortness of breath.   Cardiovascular: Positive for chest pain and leg swelling.  Gastrointestinal: Negative for abdominal pain, nausea and vomiting.  Genitourinary: Negative for hematuria.  Musculoskeletal: Positive for falls.  Skin: Negative for rash.  Neurological: Positive for weakness. Negative for focal weakness.  Psychiatric/Behavioral: Negative for memory loss and substance abuse.    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/24/2007   ANXIETY 06/24/2007   Arthritis    "hands, knees, hips, neck; q part of the body" (01/27/2015)   CELLULITIS AND ABSCESS OF LEG EXCEPT FOOT 03/28/2010   Cellulitis of left leg 01/27/2015   CHF (congestive heart failure) (HCC)    Chronic lower back pain    "I have a steel rod in my  back"   Claudication ([place]) 06/02/2011   COLONIC POLYPS, HX OF 93/02/1695   Complication of anesthesia    "they can't put me asleep cause I don't wake up" (01/27/2015)   CONSTIPATION, CHRONIC, HX OF 06/24/2007   COPD (chronic obstructive pulmonary disease) (HCC)    Coronary artery disease    CVA (cerebral vascular  accident) ([place]) 1990's   "w/MI"; denies residual on 01/27/2015   DEPRESSION 06/24/2007   DIABETES MELLITUS, TYPE II dx'd 2015   DIVERTICULOSIS, COLON 06/24/2007   DVT (deep venous thrombosis) ([place])    Heart murmur    HYPERLIPIDEMIA 06/24/2007   HYPERTENSION 06/24/2007   INSOMNIA-SLEEP DISORDER-UNSPEC 01/24/2010   Migraine    "comes over my right eye q now and then" (01/27/2015)   Morbid obesity ([place]) 12/21/2015   Myocardial infarction ([place]) 1990's X 1   "w/stroke"   OSA (obstructive sleep apnea)    SKIN LESION 01/24/2010   Unspecified Peripheral Vascular Disease 06/24/2007    Past Surgical History:  Procedure Laterality Date   ANTERIOR CERVICAL DECOMP/DISCECTOMY FUSION  1985   "job related injury"   BACK SURGERY     KNEE ARTHROSCOPY Bilateral [place] Right 1999   "wrist; job related injury"   [place]  1990's     reports that he has been smoking cigars. He has smoked for the past 48.00 years. He has never used smokeless tobacco. He reports current alcohol use. He reports that he does not use drugs.  Allergies  Allergen Reactions   Latex Swelling   Oxycodone Other (See Comments)    Sedation/somnolence    Family History  Problem Relation Age of Onset   COPD Father    Heart disease Father    Cancer Mother        lung cancer   Heart disease Maternal Grandmother    Heart disease Maternal Grandfather    Heart disease Brother    Hypertension Brother    Heart attack Brother    Peripheral vascular disease Brother     Prior to Admission medications   Medication Sig Start Date End Date Taking? Authorizing Provider  aspirin 81 MG tablet Take 81 mg by mouth daily.     Yes [provider]  clonazePAM (KLONOPIN) 0.5 MG tablet TAKE 1-2 TABLET BY MOUTH AT BEDTIME, CAN TAKE UP TO 2 DEPENDING ON ANXIETY Patient taking differently: Take 0.25 mg by mouth at bedtime.  02/19/19  Yes Biagio Borg, MD  fenofibrate 160 MG tablet TAKE 1  TABLET BY MOUTH EVERY DAY Patient taking differently: Take 160 mg by mouth daily.  01/30/19  Yes Biagio Borg, MD  furosemide (LASIX) 20 MG tablet Take 0.5 tablets (10 mg total) by mouth daily. Patient taking differently: Take 20 mg by mouth daily.  09/12/17  Yes Biagio Borg, MD  gabapentin (NEURONTIN) 100 MG capsule TAKE 2 CAPSULES BY MOUTH EVERY DAY AT BEDTIME Patient taking differently: Take 200 mg by mouth at bedtime.  03/10/19  Yes Biagio Borg, MD  hydrochlorothiazide (HYDRODIURIL) 25 MG tablet TAKE 1 TABLET BY MOUTH EVERY DAY Patient taking differently: Take 25 mg by mouth daily.  03/24/19  Yes Biagio Borg, MD  labetalol (NORMODYNE) 300 MG tablet TAKE 1 TABLET BY MOUTH TWICE A DAY Patient taking differently: Take 300 mg by mouth 2 (two) times daily.  04/22/19  Yes Biagio Borg, MD  metFORMIN (GLUCOPHAGE) 1000 MG tablet TAKE 1 TABLET BY MOUTH 2 TIMES DAILY WITH A MEAL.  Patient taking differently: Take 500 mg by mouth 2 (two) times daily.  04/14/19  Yes Biagio Borg, MD  simvastatin (ZOCOR) 80 MG tablet TAKE 1 TABLET BY MOUTH EVERYDAY AT BEDTIME Patient taking differently: Take 80 mg by mouth at bedtime.  02/03/19  Yes Biagio Borg, MD  traZODone (DESYREL) 50 MG tablet TAKE 1/2 TO 1 TABLET AT BEDTIME AS NEEDED FOR SLEEP Patient taking differently: Take 50 mg by mouth at bedtime.  04/01/19  Yes Lyndal Pulley, DO  Blood Glucose Monitoring Suppl (ACCU-CHEK NANO SMARTVIEW) w/Device KIT use as directed to check insulin levels 03/27/17   Biagio Borg, MD  Blood Glucose Monitoring Suppl DEVI 1 Device by Does not apply route daily. 02/10/15   Biagio Borg, MD  glucose blood test strip Use as instructed 02/10/15   Biagio Borg, MD  Lancets [facility] SOFT [facility]) lancets Use as instructed 01/25/17   Biagio Borg, MD  Vitamin D, Ergocalciferol, (DRISDOL) 1.25 MG (50000 UT) CAPS capsule Take 1 capsule (50,000 Units total) by mouth every 7 (seven) days. Patient not taking: Reported on 04/27/2019 01/24/19    Biagio Borg, MD    Physical Exam:  Constitutional: Elderly male who appears  ill and uncomfortable. Vitals:   05/19/2019 1330 04/28/2019 1345 05/02/2019 1400 04/25/2019 1415  BP: (!) 185/81 (!) 186/86 (!) 181/82 (!) 183/83  Pulse: 78 77 76 79  Resp: 16 17 10 11   Temp:      TempSrc:      SpO2: 94% 94% 94% 93%  Weight:      Height:       Eyes: PERRL, lids and conjunctivae normal ENMT: Mucous membranes are moist. Posterior pharynx clear of any exudate or lesions.  Neck: lymphadenopathy noted with some signs of JVD present. Respiratory: Decreased breath sounds noted on the right lung field patient currently on 2 L nasal cannula oxygen with O2 saturations maintained. Cardiovascular: Regular rate and rhythm, no murmurs / rubs / gallops.  2+ pitting lower extremity edema. 2+ pedal pulses. No carotid bruits.  Abdomen: no tenderness, no masses palpated. No hepatosplenomegaly. Bowel sounds positive.  Musculoskeletal: no clubbing / cyanosis. No joint deformity upper and lower extremities. Good ROM, no contractures. Normal muscle tone.  Skin: no rashes, lesions, ulcers. No induration Neurologic: CN 2-12 grossly intact. Sensation intact, DTR normal. Strength 5/5 in all 4.  Psychiatric: Normal judgment and insight. Alert and oriented x 3.  Depressed mood.     Labs on Admission: I have personally reviewed following labs and imaging studies  CBC: Recent Labs  Lab 05/14/2019 1012  WBC 4.0  NEUTROABS 3.6  HGB 14.1  HCT 43.3  MCV 106.4*  PLT 31*   Basic Metabolic Panel: Recent Labs  Lab 04/28/2019 1012  NA 144  K 2.1*  CL 102  CO2 31  GLUCOSE 105*  BUN 35*  CREATININE 1.34*  CALCIUM 9.2  MG 2.2   GFR: Estimated Creatinine Clearance: 65.4 mL/min (A) (by C-G formula based on SCr of 1.34 mg/dL (H)). Liver Function Tests: Recent Labs  Lab 05/11/2019 1012  AST 24  ALT 28  ALKPHOS 47  BILITOT 1.0  PROT 5.5*  ALBUMIN 3.5   No results for input(s): LIPASE, AMYLASE in the last 168  hours. No results for input(s): AMMONIA in the last 168 hours. Coagulation Profile: No results for input(s): INR, PROTIME in the last 168 hours. Cardiac Enzymes: No results for input(s): CKTOTAL, CKMB, CKMBINDEX, TROPONINI in the last 168 hours.  BNP (last 3 results) No results for input(s): PROBNP in the last 8760 hours. HbA1C: No results for input(s): HGBA1C in the last 72 hours. CBG: No results for input(s): GLUCAP in the last 168 hours. Lipid Profile: No results for input(s): CHOL, HDL, LDLCALC, TRIG, CHOLHDL, LDLDIRECT in the last 72 hours. Thyroid Function Tests: No results for input(s): TSH, T4TOTAL, FREET4, T3FREE, THYROIDAB in the last 72 hours. Anemia Panel: No results for input(s): VITAMINB12, FOLATE, FERRITIN, TIBC, IRON, RETICCTPCT in the last 72 hours. Urine analysis:    Component Value Date/Time   COLORURINE YELLOW 04/24/2019 1344   APPEARANCEUR CLEAR 05/04/2019 1344   LABSPEC 1.019 05/10/2019 1344   PHURINE 6.0 04/29/2019 1344   GLUCOSEU NEGATIVE 05/09/2019 1344   GLUCOSEU NEGATIVE 01/24/2019 1047   HGBUR MODERATE (A) 04/26/2019 1344   BILIRUBINUR NEGATIVE 05/19/2019 1344   KETONESUR NEGATIVE 05/02/2019 1344   PROTEINUR 30 (A) 05/01/2019 1344   UROBILINOGEN 0.2 01/24/2019 1047   NITRITE NEGATIVE 05/16/2019 1344   LEUKOCYTESUR NEGATIVE 05/18/2019 1344   Sepsis Labs: Recent Results (from the past 240 hour(s))  SARS Coronavirus 2 [facility] order, Performed in [facility] hospital lab) Nasopharyngeal Nasopharyngeal Swab     Status: None   Collection Time: 05/04/2019 12:46 PM   Specimen: Nasopharyngeal Swab  Result Value Ref Range Status   SARS Coronavirus 2 NEGATIVE NEGATIVE Final    Comment: (NOTE) If result is NEGATIVE SARS-CoV-2 target nucleic acids are NOT DETECTED. The SARS-CoV-2 RNA is generally detectable in upper and lower  respiratory specimens during the acute phase of infection. The lowest  concentration of SARS-CoV-2 viral copies this assay can  detect is 250  copies / mL. A negative result does not preclude SARS-CoV-2 infection  and should not be used as the sole basis for treatment or other  patient management decisions.  A negative result may occur with  improper specimen collection / handling, submission of specimen other  than nasopharyngeal swab, presence of viral mutation(s) within the  areas targeted by this assay, and inadequate number of viral copies  (<250 copies / mL). A negative result must be combined with clinical  observations, patient history, and epidemiological information. If result is POSITIVE SARS-CoV-2 target nucleic acids are DETECTED. The SARS-CoV-2 RNA is generally detectable in upper and lower  respiratory specimens dur ing the acute phase of infection.  Positive  results are indicative of active infection with SARS-CoV-2.  Clinical  correlation with patient history and other diagnostic information is  necessary to determine patient infection status.  Positive results do  not rule out bacterial infection or co-infection with other viruses. If result is PRESUMPTIVE POSTIVE SARS-CoV-2 nucleic acids MAY BE PRESENT.   A presumptive positive result was obtained on the submitted specimen  and confirmed on repeat testing.  While 2019 novel coronavirus  (SARS-CoV-2) nucleic acids may be present in the submitted sample  additional confirmatory testing may be necessary for epidemiological  and / or clinical management purposes  to differentiate between  SARS-CoV-2 and other Sarbecovirus currently known to infect humans.  If clinically indicated additional testing with an alternate test  methodology [phone]) is advised. The SARS-CoV-2 RNA is generally  detectable in upper and lower respiratory sp ecimens during the acute  phase of infection. The expected result is Negative. Fact Sheet for Patients:  StrictlyIdeas.no Fact Sheet for Healthcare  Providers: BankingDealers.co.za This test is not yet approved or cleared by the [place] FDA and has been authorized for detection and/or diagnosis of SARS-CoV-2 by  FDA under an Emergency Use Authorization (EUA).  This EUA will remain in effect (meaning this test can be used) for the duration of the COVID-19 declaration under Section 564(b)(1) of the Act, 21 U.S.C. section 360bbb-3(b)(1), unless the authorization is terminated or revoked sooner. Performed at [facility], [place] [address], [place], [place] [address]      Radiological Exams on Admission: Ct Head Wo Contrast  Result Date: 04/24/2019 CLINICAL DATA:  [place] from chair at home.  Generalize weakness. EXAM: CT HEAD WITHOUT CONTRAST TECHNIQUE: Contiguous axial images were obtained from the base of the skull through the vertex without intravenous contrast. COMPARISON:  Head CT 05/08/2019 FINDINGS: Brain: Remote MCA territory infarct on the right side with encephalomalacia. There is also age advanced fairly extensive periventricular white matter disease. The ventricles are in the midline in are normal in configuration. No extra-axial fluid collections are identified. No intracranial mass lesions. The brainstem and cerebellum are grossly normal. Vascular: Stable vascular calcifications. No definite aneurysm or hyperdense vessels. Skull: No skull fracture or bone lesions. Sinuses/Orbits: The paranasal sinuses and mastoid air cells are clear. The globes are intact. Other: No scalp lesions or hematoma. IMPRESSION: 1. Remote MCA territory infarct on the right side with encephalomalacia. 2. Age advanced periventricular white matter disease. 3. No acute intracranial findings or mass lesions. Electronically Signed   By: Marijo Sanes M.D.   On: 05/13/2019 13:08   Dg Chest Port 1 View  Result Date: 05/21/2019 CLINICAL DATA:  Chest pain and weakness for a few days. EXAM: PORTABLE CHEST 1 VIEW COMPARISON:  CT scan  05/08/2019 FINDINGS: The heart is mildly enlarged but stable. Stable tortuosity and calcification of the thoracic aorta. Stable large right hilar and mediastinal mass and associated obstructive pneumonitis in the right upper lobe. The left lung is grossly clear. The bony structures are intact. IMPRESSION: Stable large right hilar and mediastinal mass/adenopathy and right upper lobe obstructive pneumonitis. Electronically Signed   By: Marijo Sanes M.D.   On: 05/11/2019 12:44    EKG: Independently reviewed.  Sinus rhythm at 79 bpm with QTc 538  Assessment/Plan Acute respiratory failure with hypoxia secondary to right perihilar mass(suspected primary lung cancer), metastatic disease: Acute.  Patient presents with worsening cough and shortness of breath.  O2 saturations low as 87% on room air improved with 2 L nasal cannula oxygen.  He was initially seen on 9/17 for progressively worsening cough found to have large perihilar mass with metastatic lesions, but had declined patient at that time.  PCCM was again consulted.  Previous blood cultures were negative from 9/17. 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 -Heparin per pharmacy  Hypokalemia: Acute.  Initial potassium noted to be as low as 2.1 on admission and magnesium within normal limits.  Patient was given 60 mEq IV and 40 mEq p.o. -Continue to monitor and replace as needed  Hypertensive urgency: Acute. Initial blood pressures elevated at 202/91 -Continue labetalol -Held diuretics for renal insufficiency   Renal insufficiency, renal  hydronephrosis: Acute.  Patient creatinine mildly elevated at 1.34 with BUN 35.  Elevated BUN to creatinine ratio suggest prerenal cause of symptoms.  Patient given IV fluids  with replacement of his electrolytes. -Need to continue to monitor  -May warrant urology consultation for stent placement depending on goals of care  COPD: Stable.  Patient without acute wheezing -Albuterol nebs as needed  Congestive heart failure: Last BNP elevated at 422 on 9/17.  On physical exam patient with 2+ pitting edema of the bilateral lower extremities JVD present, but question secondary to mass. -Strict I&Os -Daily weight -Follow-up repeat BMP  Depression: Patient apparently reported contemplating suicide with pulmonology. Kennon Holter to bedside -Spiritual consult -May benefit from psychiatry consultation as well  Thrombocytopenia: Acute on chronic.  Platelet count 31.  Patient with multiple areas of bruising noted in upper and lower extremities.  CT imaging did note signs of cirrhosis. -Continue to monitor  Diabetes mellitus type 2: Last hemoglobin A1c noted to be 5.6 on 05/08/2019. -Hypoglycemic protocol -Held metformin -CBGs q. before meals with sensitive SSI  Dyslipidemia -Continue fenofibrate and pharmacy substitution for simvastatin  Tobacco use: Patient still reports smoking half pack cigarettes per day on average. -Nicotine patch offered  AAA: As noted on CT angiogram from 9/17 noted to be 3.5 cm in diameter  DVT prophylaxis: Heparin per pharmacy Code Status: Full Family Communication: Discussed plan of care with patient's wife over the phone Disposition Plan: To be determined Consults called: Pulmonology Admission status: Inpatient  Norval Morton MD [facility] Pager [phone]   If 7PM-7AM, please contact night-coverage [email or web address] Password [facility]  04/22/2019, 2:29 PM

## 2019-05-20 NOTE — ED Triage Notes (Signed)
Pt BIB [facility] after a mechanical fall out of his chair at home. Per EMS patient had no injuries from his fall and just slid out of his lift chair this morning. Pt complaining of generalized weakness and shortness of breath. Pt given 1 nitro SL by EMS and blood pressure was in the 200's prior to taking the nitro per EMS. Pt 87% on room air upon ED arrival. Pt does have a wet sounding cough.

## 2019-05-20 NOTE — Consult Note (Signed)
NAME:  Eric [name], MRN:  [id number], DOB:  1950/09/25, LOS: 0 ADMISSION DATE:  05/10/2019, CONSULTATION DATE: 05/14/2019 REFERRING MD:  Trude Mcburney, PA, CHIEF COMPLAINT: Right hilar mass  Brief History   68 year old smoker with newly identified right hilar mass (9/17), possible associated thrombus at the brachiocephalic confluence and in the SVC.  History of present illness   68 year old smoker with a right hilar mass identified on CT scan of the chest 9/17, possible associated thrombus at the brachiocephalic confluence, possible clot or tumor in the SVC.  He was in the emergency department 9/17 for dyspnea, chest discomfort and falls.  He left AMA even after the details of his CT chest were described to him.  He returns now with progressive weakness, falling.  PCCM consulted to assist with evaluation of his CT abnormalities, presumed lung cancer  Past Medical History   has a past medical history of ALLERGIC RHINITIS (06/24/2007), Anal fistula (06/24/2007), ANXIETY (06/24/2007), Arthritis, CELLULITIS AND ABSCESS OF LEG EXCEPT FOOT (03/28/2010), Cellulitis of left leg (01/27/2015), CHF (congestive heart failure) ([place]), Chronic lower back pain, Claudication ([place]) (06/02/2011), COLONIC POLYPS, HX OF (75/08/256), Complication of anesthesia, CONSTIPATION, CHRONIC, HX OF (06/24/2007), COPD (chronic obstructive pulmonary disease) ([place]), Coronary artery disease, CVA (cerebral vascular accident) ([place]) (1990's), DEPRESSION (06/24/2007), DIABETES MELLITUS, TYPE II (dx'd 2015), DIVERTICULOSIS, COLON (06/24/2007), DVT (deep venous thrombosis) ([place]), Heart murmur, HYPERLIPIDEMIA (06/24/2007), HYPERTENSION (06/24/2007), INSOMNIA-SLEEP DISORDER-UNSPEC (01/24/2010), Migraine, Morbid obesity ([place]) (12/21/2015), Myocardial infarction ([place]) (1990's X 1), OSA (obstructive sleep apnea), SKIN LESION (01/24/2010), and Unspecified Peripheral Vascular Disease (06/24/2007).   Significant Hospital Events     Consults:  PCCM  Procedures:     Significant Diagnostic Tests:  CT chest 9/17 >> large right hilar mass, mediastinal invasion and bulky mediastinal lymphadenopathy, probable right mainstem endobronchial lesion.  Encasement of the right pulmonary artery and hilar vessels, question thrombus at the brachiocephalic confluence with narrowing and/or tumor with thrombus at the SVC.  Probable metastatic disease in the right upper lobe.  Bilateral low axillary lymphadenopathy, likely metastatic CT abdomen 9/17 >> bilateral adrenal masses, likely metastatic numerous perinephric soft tissue masses of multiple suspicious retroperitoneal peritoneal metastatic lesions.  2.8 cm probable metastatic mass in the left gluteal musculature  Micro Data:    Antimicrobials:     Interim history/subjective:  Patient notes that he went home last time ANA because he did not think that he had any options.  He also notes that he has contemplated suicide given the lung cancer diagnosis.  Objective   Blood pressure (!) 183/83, pulse 79, temperature 98.3 F (36.8 C), temperature source Oral, resp. rate 11, height _0  (1.854 m), weight 102.5 kg, SpO2 93 %.        Intake/Output Summary (Last 24 hours) at 04/26/2019 1455 Last data filed at 05/04/2019 1332 Gross per 24 hour  Intake 100 ml  Output --  Net 100 ml   Filed Weights   05/06/2019 0957  Weight: 102.5 kg    Examination: General: Ill-appearing obese man, uncomfortable, no respiratory distress HENT: Large neck, cervical lymphadenopathy, hoarse voice, managing secretions adequately, pupils equal, scleral hemorrhages present without icterus Lungs: Clear on the left, very little air movement on the right, some coarse bronchial breath sounds Cardiovascular: Borderline tachycardic, regular, no murmur, distant Abdomen: Obese, soft, diffusely mildly tender but no rebound or guarding.  Positive bowel sounds Extremities: 2-3+ bilateral pretibial edema Neuro: Awake, alert, interacts and follows  commands, moves all extremities Skin: Widespread telangiectasias on his shoulders, face,  chest  Resolved Hospital Problem list     Assessment & Plan:  Right hilar mass with probable primary lung cancer and associated local and distant metastatic disease.   -We identified this on 9/17 and the patient left AMA.  If he wants to hear the options for therapy then he will need a tissue diagnosis.  I believe the most straightforward biopsy targets would either be axillary lymph nodes or gluteal soft tissue mass.  Either of these could be done surgically and would be the most straightforward since he is likely going to be on anticoagulation.  This would spare him conscious sedation which would be required if we pursue bronchoscopy (which I think would also give a diagnosis).  Certainly bronchoscopy is an option if an alternative biopsy is not possible. -If Eric [name] does not want it does not think he can benefit from therapy for his cancer, even palliative therapy which would potentially prevent right mainstem bleeding, thromboembolic disease, etc., then I think he needs to transition to home hospice. -I had a long discussion with the patient and his wife in the emergency department reviewing the options that have outlined above.  In particular I made it clear that he needs to decide on a philosophy for his care, either obtained diagnostics and talk about the potential therapeutics that can be offered (including palliative XRT, etc.) or deferring any procedures, biopsy, treatment and transitioning to hospice which would allow him to live as comfortably as possible, as safely as possible and hopefully at home.  He is going to think about this and give feedback.  This will guide whether we decide to do any invasive biopsies, continue anticoagulation, involve oncology, etc. -Whether Eric [name] decides to pursue a tissue diagnosis or not, I believe we should consult palliative care service now.  They can help guide  him through the process even if he decides to undertake a tissue diagnosis or palliative treatment.  Falls. -At high risk for intracranial metastatic disease.  No mass was seen on his head CT from 9/17.  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  I would recommend heparin infusion without a bolus since this could be stopped to facilitate any potential diagnostics.  May be most appropriate to get the MRI brain first to ensure no intracranial malignancy  Situational depression. -The patient clearly is affected by this new diagnosis of malignancy.  He is also under strain in his relationship with his spouse.  He noted to me that he has contemplated suicide.  I think psychiatry support and evaluation in conjunction with the palliative care evaluation would be appropriate  Labs   CBC: Recent Labs  Lab 05/04/2019 1012  WBC 4.0  NEUTROABS 3.6  HGB 14.1  HCT 43.3  MCV 106.4*  PLT 31*    Basic Metabolic Panel: Recent Labs  Lab 05/16/2019 1012  NA 144  K 2.1*  CL 102  CO2 31  GLUCOSE 105*  BUN 35*  CREATININE 1.34*  CALCIUM 9.2  MG 2.2   GFR: Estimated Creatinine Clearance: 65.4 mL/min (A) (by C-G formula based on SCr of 1.34 mg/dL (H)). Recent Labs  Lab 04/30/2019 1012  WBC 4.0    Liver Function Tests: Recent Labs  Lab 04/29/2019 1012  AST 24  ALT 28  ALKPHOS 47  BILITOT 1.0  PROT 5.5*  ALBUMIN 3.5   No results for input(s): LIPASE,  AMYLASE in the last 168 hours. No results for input(s): AMMONIA in the last 168 hours.  ABG No results found for: PHART, PCO2ART, PO2ART, HCO3, TCO2, ACIDBASEDEF, O2SAT   Coagulation Profile: No results for input(s): INR, PROTIME in the last 168 hours.  Cardiac Enzymes: No results for input(s): CKTOTAL, CKMB, CKMBINDEX, TROPONINI in the last 168 hours.  HbA1C: HbA1c, POC (prediabetic range)  Date/Time Value Ref  Range Status  07/23/2018 03:37 PM 0 (A) 5.7 - 6.4 % Final   HbA1c, POC (controlled diabetic range)  Date/Time Value Ref Range Status  07/23/2018 03:37 PM 0.0 0.0 - 7.0 % Final    Comment:    `   HbA1c POC (<> result, manual entry)  Date/Time Value Ref Range Status  07/23/2018 03:37 PM 0 4.0 - 5.6 % Final   Hgb A1c MFr Bld  Date/Time Value Ref Range Status  05/08/2019 07:57 PM 5.6 4.8 - 5.6 % Final    Comment:    (NOTE)         Prediabetes: 5.7 - 6.4         Diabetes: >6.4         Glycemic control for adults with diabetes: <7.0   01/24/2019 10:47 AM 6.2 4.6 - 6.5 % Final    Comment:    Glycemic Control Guidelines for People with Diabetes:Non Diabetic:  <6%Goal of Therapy: <7%Additional Action Suggested:  >8%     CBG: No results for input(s): GLUCAP in the last 168 hours.  Review of Systems:   Chest pain Falls Cough, sometimes with hemoptysis for the last 2 weeks  Past Medical History  He,  has a past medical history of ALLERGIC RHINITIS (06/24/2007), Anal fistula (06/24/2007), ANXIETY (06/24/2007), Arthritis, CELLULITIS AND ABSCESS OF LEG EXCEPT FOOT (03/28/2010), Cellulitis of left leg (01/27/2015), CHF (congestive heart failure) ([place]), Chronic lower back pain, Claudication ([place]) (06/02/2011), COLONIC POLYPS, HX OF (77/03/2422), Complication of anesthesia, CONSTIPATION, CHRONIC, HX OF (06/24/2007), COPD (chronic obstructive pulmonary disease) ([place]), Coronary artery disease, CVA (cerebral vascular accident) ([place]) (1990's), DEPRESSION (06/24/2007), DIABETES MELLITUS, TYPE II (dx'd 2015), DIVERTICULOSIS, COLON (06/24/2007), DVT (deep venous thrombosis) ([place]), Heart murmur, HYPERLIPIDEMIA (06/24/2007), HYPERTENSION (06/24/2007), INSOMNIA-SLEEP DISORDER-UNSPEC (01/24/2010), Migraine, Morbid obesity ([place]) (12/21/2015), Myocardial infarction ([place]) (1990's X 1), OSA (obstructive sleep apnea), SKIN LESION (01/24/2010), and Unspecified Peripheral Vascular Disease (06/24/2007).   Surgical History    Past  Surgical History:  Procedure Laterality Date   ANTERIOR CERVICAL DECOMP/DISCECTOMY FUSION  50   "job related injury"   BACK SURGERY     KNEE ARTHROSCOPY Bilateral 1990   LIGAMENT REPAIR Right 1999   "wrist; job related injury"   LUMBAR LAMINECTOMY  1990's     Social History   reports that he has been smoking cigars. He has smoked for the past 48.00 years. He has never used smokeless tobacco. He reports current alcohol use. He reports that he does not use drugs.   Family History   His family history includes COPD in his father; Cancer in his mother; Heart attack in his brother; Heart disease in his brother, father, maternal grandfather, and maternal grandmother; Hypertension in his brother; Peripheral vascular disease in his brother.   Allergies Allergies  Allergen Reactions   Latex Swelling   Oxycodone Other (See Comments)    Sedation/somnolence     Home Medications  Prior to Admission medications   Medication Sig Start Date End Date Taking? Authorizing Provider  aspirin 81 MG tablet Take 81 mg by mouth daily.  Yes [provider]  clonazePAM (KLONOPIN) 0.5 MG tablet TAKE 1-2 TABLET BY MOUTH AT BEDTIME, CAN TAKE UP TO 2 DEPENDING ON ANXIETY Patient taking differently: Take 0.25 mg by mouth at bedtime.  02/19/19  Yes Biagio Borg, MD  fenofibrate 160 MG tablet TAKE 1 TABLET BY MOUTH EVERY DAY Patient taking differently: Take 160 mg by mouth daily.  01/30/19  Yes Biagio Borg, MD  furosemide (LASIX) 20 MG tablet Take 0.5 tablets (10 mg total) by mouth daily. Patient taking differently: Take 20 mg by mouth daily.  09/12/17  Yes Biagio Borg, MD  gabapentin (NEURONTIN) 100 MG capsule TAKE 2 CAPSULES BY MOUTH EVERY DAY AT BEDTIME Patient taking differently: Take 200 mg by mouth at bedtime.  03/10/19  Yes Biagio Borg, MD  hydrochlorothiazide (HYDRODIURIL) 25 MG tablet TAKE 1 TABLET BY MOUTH EVERY DAY Patient taking differently: Take 25 mg by mouth daily.  03/24/19   Yes Biagio Borg, MD  labetalol (NORMODYNE) 300 MG tablet TAKE 1 TABLET BY MOUTH TWICE A DAY Patient taking differently: Take 300 mg by mouth 2 (two) times daily.  04/22/19  Yes Biagio Borg, MD  metFORMIN (GLUCOPHAGE) 1000 MG tablet TAKE 1 TABLET BY MOUTH 2 TIMES DAILY WITH A MEAL. Patient taking differently: Take 500 mg by mouth 2 (two) times daily.  04/14/19  Yes Biagio Borg, MD  simvastatin (ZOCOR) 80 MG tablet TAKE 1 TABLET BY MOUTH EVERYDAY AT BEDTIME Patient taking differently: Take 80 mg by mouth at bedtime.  02/03/19  Yes Biagio Borg, MD  traZODone (DESYREL) 50 MG tablet TAKE 1/2 TO 1 TABLET AT BEDTIME AS NEEDED FOR SLEEP Patient taking differently: Take 50 mg by mouth at bedtime.  04/01/19  Yes Lyndal Pulley, DO  Blood Glucose Monitoring Suppl (ACCU-CHEK NANO SMARTVIEW) w/Device KIT use as directed to check insulin levels 03/27/17   Biagio Borg, MD  Blood Glucose Monitoring Suppl DEVI 1 Device by Does not apply route daily. 02/10/15   Biagio Borg, MD  glucose blood test strip Use as instructed 02/10/15   Biagio Borg, MD  Lancets [facility] SOFT [facility]) lancets Use as instructed 01/25/17   Biagio Borg, MD  Vitamin D, Ergocalciferol, (DRISDOL) 1.25 MG (50000 UT) CAPS capsule Take 1 capsule (50,000 Units total) by mouth every 7 (seven) days. Patient not taking: Reported on 05/12/2019 01/24/19   Biagio Borg, MD     Baltazar Apo, MD, PhD 04/29/2019, 4:17 PM [facility] [phone] or if no answer [phone]

## 2019-05-20 NOTE — ED Notes (Signed)
Dinner tray ordered.

## 2019-05-20 NOTE — ED Provider Notes (Signed)
[facility] EMERGENCY DEPARTMENT Provider Note   CSN: [id number] Arrival date & time: 05/17/2019  3086     History   Chief Complaint Chief Complaint  Patient presents with  . Weakness    HPI Eric [name] is a 68 y.o. male.     HPI Patient presents to the emergency department with increasing weakness that started over the last few days.  The patient was attempted to be admitted to the hospital on the 17th but he wanted to leave.  The patient has not been much better since leaving the hospital.  The patient has become short of breath as well.  Patient has been hypoxic on his previous visit for being discharged.  Patient has had multiple falls and continue to get weaker.  The wife states he is also had worsening shortness of breath.  The patient denies chest pain,  headache,blurred vision, neck pain, fever, cough,numbness, dizziness, anorexia, edema, abdominal pain, nausea, vomiting, diarrhea, rash, back pain, dysuria, hematemesis, bloody stool, near syncope, or syncope. Past Medical History:  Diagnosis Date  . ALLERGIC RHINITIS 06/24/2007  . 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 ANXIETY 06/24/2007  . Arthritis    "hands, knees, hips, neck; q part of the body" (01/27/2015)  . CELLULITIS AND ABSCESS OF LEG EXCEPT FOOT 03/28/2010  . Cellulitis of left leg 01/27/2015  . CHF (congestive heart failure) ([place])   . Chronic lower back pain    "I have a steel rod in my back"  . Claudication ([place]) 06/02/2011  . COLONIC POLYPS, HX OF 06/24/2007  . Complication of anesthesia    "they can't put me asleep cause I don't wake up" (01/27/2015)  . CONSTIPATION, CHRONIC, HX OF 06/24/2007  . COPD (chronic obstructive pulmonary disease) ([place])   . Coronary artery disease   . CVA (cerebral vascular accident) [facility]) 1990's   "w/MI"; denies residual on 01/27/2015  . DEPRESSION 06/24/2007  . DIABETES MELLITUS, TYPE II dx'd 2015  . DIVERTICULOSIS, COLON 06/24/2007  . DVT (deep venous thrombosis) ([place])   . Heart  murmur   . HYPERLIPIDEMIA 06/24/2007  . HYPERTENSION 06/24/2007  . INSOMNIA-SLEEP DISORDER-UNSPEC 01/24/2010  . Migraine    "comes over my right eye q now and then" (01/27/2015)  . Morbid obesity ([place]) 12/21/2015  . Myocardial infarction ([place]) 1990's X 1   "w/stroke"  . OSA (obstructive sleep apnea)   . SKIN LESION 01/24/2010  . Unspecified Peripheral Vascular Disease 06/24/2007    Patient Active Problem List   Diagnosis Date Noted  . Mediastinal mass 05/08/2019  . Degenerative arthritis of knee, bilateral 01/24/2019  . Recurrent falls 01/24/2019  . Gait disorder 01/05/2018  . Leg wound, left 01/04/2018  . Open leg wound 01/04/2018  . Cellulitis of right leg 08/31/2017  . Dyspnea 08/31/2017  . Peripheral edema 08/31/2017  . Morbid obesity ([place]) 12/21/2015  . Lower back pain 06/22/2015  . Cough 06/22/2015  . COPD exacerbation ([place]) 06/22/2015  . Blurred vision, left eye 06/22/2015  . Encounter for well adult exam with abnormal findings 02/10/2015  . Anxiety and depression 01/27/2015  . COPD (chronic obstructive pulmonary disease) ([place]) 01/27/2015  . Dyslipidemia 01/27/2015  . 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[place]) 57/84/6962  . Thrombocytopenia ([place]) 01/27/2015  . AAA (abdominal aortic aneurysm) ([place]) 08/28/2014  . Lumbar radiculopathy 07/20/2014  . Bilateral lower extremity edema 06/29/2014  . Bilateral knee pain 05/31/2014  . Claudication ([place]) 06/02/2011  . Obstructive sleep apnea 06/24/2007  . Essential hypertension 06/24/2007  . Allergic  rhinitis 06/24/2007    Past Surgical History:  Procedure Laterality Date  . ANTERIOR CERVICAL DECOMP/DISCECTOMY FUSION  1985   "job related injury"  . BACK SURGERY    . KNEE ARTHROSCOPY Bilateral 1990  . LIGAMENT REPAIR Right 1999   "wrist; job related injury"  . LUMBAR LAMINECTOMY  1990's        Home Medications    Prior to Admission medications   Medication Sig Start Date End Date Taking? Authorizing Provider  aspirin  81 MG tablet Take 81 mg by mouth daily.     Yes [provider]  clonazePAM (KLONOPIN) 0.5 MG tablet TAKE 1-2 TABLET BY MOUTH AT BEDTIME, CAN TAKE UP TO 2 DEPENDING ON ANXIETY Patient taking differently: Take 0.25 mg by mouth at bedtime.  02/19/19  Yes Biagio Borg, MD  fenofibrate 160 MG tablet TAKE 1 TABLET BY MOUTH EVERY DAY Patient taking differently: Take 160 mg by mouth daily.  01/30/19  Yes Biagio Borg, MD  furosemide (LASIX) 20 MG tablet Take 0.5 tablets (10 mg total) by mouth daily. Patient taking differently: Take 20 mg by mouth daily.  09/12/17  Yes Biagio Borg, MD  gabapentin (NEURONTIN) 100 MG capsule TAKE 2 CAPSULES BY MOUTH EVERY DAY AT BEDTIME Patient taking differently: Take 200 mg by mouth at bedtime.  03/10/19  Yes Biagio Borg, MD  hydrochlorothiazide (HYDRODIURIL) 25 MG tablet TAKE 1 TABLET BY MOUTH EVERY DAY Patient taking differently: Take 25 mg by mouth daily.  03/24/19  Yes Biagio Borg, MD  labetalol (NORMODYNE) 300 MG tablet TAKE 1 TABLET BY MOUTH TWICE A DAY Patient taking differently: Take 300 mg by mouth 2 (two) times daily.  04/22/19  Yes Biagio Borg, MD  metFORMIN (GLUCOPHAGE) 1000 MG tablet TAKE 1 TABLET BY MOUTH 2 TIMES DAILY WITH A MEAL. Patient taking differently: Take 500 mg by mouth 2 (two) times daily.  04/14/19  Yes Biagio Borg, MD  simvastatin (ZOCOR) 80 MG tablet TAKE 1 TABLET BY MOUTH EVERYDAY AT BEDTIME Patient taking differently: Take 80 mg by mouth at bedtime.  02/03/19  Yes Biagio Borg, MD  traZODone (DESYREL) 50 MG tablet TAKE 1/2 TO 1 TABLET AT BEDTIME AS NEEDED FOR SLEEP Patient taking differently: Take 50 mg by mouth at bedtime.  04/01/19  Yes Lyndal Pulley, DO  Blood Glucose Monitoring Suppl (ACCU-CHEK NANO SMARTVIEW) w/Device KIT use as directed to check insulin levels 03/27/17   Biagio Borg, MD  Blood Glucose Monitoring Suppl DEVI 1 Device by Does not apply route daily. 02/10/15   Biagio Borg, MD  glucose blood test strip Use as  instructed 02/10/15   Biagio Borg, MD  Lancets [facility] SOFT [facility]) lancets Use as instructed 01/25/17   Biagio Borg, MD  Vitamin D, Ergocalciferol, (DRISDOL) 1.25 MG (50000 UT) CAPS capsule Take 1 capsule (50,000 Units total) by mouth every 7 (seven) days. Patient not taking: Reported on 05/17/2019 01/24/19   Biagio Borg, MD    Family History Family History  Problem Relation Age of Onset  . COPD Father   . Heart disease Father   . Cancer Mother        lung cancer  . Heart disease Maternal Grandmother   . Heart disease Maternal Grandfather   . Heart disease Brother   . Hypertension Brother   . Heart attack Brother   . Peripheral vascular disease Brother     Social History Social History  Tobacco Use  . Smoking status: Current Every Day Smoker    Years: 48.00    Types: Cigars  . Smokeless tobacco: Never Used  . Tobacco comment: 01/27/2015 pt states he smokes 5 cigar cigarettes daily  Substance Use Topics  . Alcohol use: Yes    Alcohol/week: 0.0 standard drinks    Comment: 01/27/2015 "might have a couple beers/yr"  . Drug use: No     Allergies   Latex and Oxycodone   Review of Systems Review of Systems All other systems negative except as documented in the HPI. All pertinent positives and negatives as reviewed in the HPI.  Physical Exam Updated Vital Signs BP (!) 183/83   Pulse 79   Temp 98.3 F (36.8 C) (Oral)   Resp 11   Ht 6' 1"  (1.854 m)   Wt 102.5 kg   SpO2 93%   BMI 29.82 kg/m   Physical Exam Vitals signs and nursing note reviewed.  Constitutional:      General: He is not in acute distress.    Appearance: He is well-developed.  HENT:     Head: Normocephalic and atraumatic.  Eyes:     Pupils: Pupils are equal, round, and reactive to light.  Neck:     Musculoskeletal: Normal range of motion and neck supple.  Cardiovascular:     Rate and Rhythm: Normal rate and regular rhythm.     Heart sounds: Normal heart sounds. No murmur. No friction rub. No  gallop.   Pulmonary:     Effort: Pulmonary effort is normal. No respiratory distress.     Breath sounds: No stridor. Rhonchi present.  Abdominal:     General: Bowel sounds are normal. There is no distension.     Palpations: Abdomen is soft.     Tenderness: There is no abdominal tenderness.  Skin:    General: Skin is warm and dry.     Capillary Refill: Capillary refill takes less than 2 seconds.     Findings: No erythema or rash.  Neurological:     Mental Status: He is alert and oriented to person, place, and time.     Motor: No abnormal muscle tone.     Coordination: Coordination normal.  Psychiatric:        Behavior: Behavior normal.      ED Treatments / Results  Labs (all labs ordered are listed, but only abnormal results are displayed) Labs Reviewed  COMPREHENSIVE METABOLIC PANEL - Abnormal; Notable for the following components:      Result Value   Potassium 2.1 (*)    Glucose, Bld 105 (*)    BUN 35 (*)    Creatinine, Ser 1.34 (*)    Total Protein 5.5 (*)    GFR calc non Af Amer 54 (*)    All other components within normal limits  CBC WITH DIFFERENTIAL/PLATELET - Abnormal; Notable for the following components:   RBC 4.07 (*)    MCV 106.4 (*)    MCH 34.6 (*)    Platelets 31 (*)    Lymphs Abs 0.2 (*)    All other components within normal limits  URINALYSIS, ROUTINE W REFLEX MICROSCOPIC - Abnormal; Notable for the following components:   Hgb urine dipstick MODERATE (*)    Protein, ur 30 (*)    All other components within normal limits  SARS CORONAVIRUS 2 (HOSPITAL ORDER, [place] [facility])  MAGNESIUM    EKG EKG Interpretation  Date/Time:  Tuesday May 20 2019 09:51:32 EDT  Ventricular Rate:  79 PR Interval:    QRS Duration: 149 QT Interval:  469 QTC Calculation: 538 R Axis:   -53 Text Interpretation:  Sinus rhythm Nonspecific IVCD with LAD Confirmed by Quintella Reichert [phone]) on 05/04/2019 12:05:00 PM   Radiology Ct Head Wo  Contrast  Result Date: 05/04/2019 CLINICAL DATA:  [place] from chair at home.  Generalize weakness. EXAM: CT HEAD WITHOUT CONTRAST TECHNIQUE: Contiguous axial images were obtained from the base of the skull through the vertex without intravenous contrast. COMPARISON:  Head CT 05/08/2019 FINDINGS: Brain: Remote MCA territory infarct on the right side with encephalomalacia. There is also age advanced fairly extensive periventricular white matter disease. The ventricles are in the midline in are normal in configuration. No extra-axial fluid collections are identified. No intracranial mass lesions. The brainstem and cerebellum are grossly normal. Vascular: Stable vascular calcifications. No definite aneurysm or hyperdense vessels. Skull: No skull fracture or bone lesions. Sinuses/Orbits: The paranasal sinuses and mastoid air cells are clear. The globes are intact. Other: No scalp lesions or hematoma. IMPRESSION: 1. Remote MCA territory infarct on the right side with encephalomalacia. 2. Age advanced periventricular white matter disease. 3. No acute intracranial findings or mass lesions. Electronically Signed   By: Marijo Sanes M.D.   On: 05/02/2019 13:08   Dg Chest Port 1 View  Result Date: 05/04/2019 CLINICAL DATA:  Chest pain and weakness for a few days. EXAM: PORTABLE CHEST 1 VIEW COMPARISON:  CT scan 05/08/2019 FINDINGS: The heart is mildly enlarged but stable. Stable tortuosity and calcification of the thoracic aorta. Stable large right hilar and mediastinal mass and associated obstructive pneumonitis in the right upper lobe. The left lung is grossly clear. The bony structures are intact. IMPRESSION: Stable large right hilar and mediastinal mass/adenopathy and right upper lobe obstructive pneumonitis. Electronically Signed   By: Marijo Sanes M.D.   On: 05/05/2019 12:44    Procedures Procedures (including critical care time)  Medications Ordered in ED Medications  potassium chloride 10 mEq in 100 mL  IVPB (0 mEq Intravenous Stopped 05/18/2019 1333)  heparin ADULT infusion 100 units/mL (25000 units/243m sodium chloride 0.45%) (has no administration in time range)  heparin bolus via infusion 3,000 Units (has no administration in time range)  potassium chloride 20 MEQ/15ML (10%) solution 40 mEq (40 mEq Oral Given 05/11/2019 1233)     Initial Impression / Assessment and Plan / ED Course  I have reviewed the triage vital signs and the nursing notes.  Pertinent labs & imaging results that were available during my care of the patient were reviewed by me and considered in my medical decision making (see chart for details).        I spoke with the child hospitalist will admit the patient.  Also speak with pulmonology again about the patient.  The patient was started back on a heparin.  Also started on runs a potassium.  Final Clinical Impressions(s) / ED Diagnoses   Final diagnoses:  Weakness  Hypokalemia    ED Discharge Orders    None       LDalia Heading PA-C 04/27/2019 1440    RQuintella Reichert MD 05/22/19 [phone]

## 2019-05-20 NOTE — Progress Notes (Signed)
ANTICOAGULATION CONSULT NOTE - Initial Consult  Pharmacy Consult for Heparin Indication: SVC thrombus  Allergies  Allergen Reactions  . Latex Swelling  . Oxycodone Other (See Comments)    Sedation/somnolence    Patient Measurements: Height: 6\' 1"  (185.4 cm) Weight: 226 lb (102.5 kg) IBW/kg (Calculated) : 79.9 Heparin Dosing Weight: 100.7 kg  Vital Signs: Temp: 98.3 F (36.8 C) (09/29 0953) Temp Source: Oral (09/29 0953) BP: 183/83 (09/29 1415) Pulse Rate: 79 (09/29 1415)  Labs: Recent Labs    04/22/2019 1012  HGB 14.1  HCT 43.3  PLT 31*  CREATININE 1.34*    Estimated Creatinine Clearance: 65.4 mL/min (A) (by C-G formula based on SCr of 1.34 mg/dL (H)).   Medical History: Past Medical History:  Diagnosis Date  . ALLERGIC RHINITIS 06/24/2007  . 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 ANXIETY 06/24/2007  . Arthritis    "hands, knees, hips, neck; q part of the body" (01/27/2015)  . CELLULITIS AND ABSCESS OF LEG EXCEPT FOOT 03/28/2010  . Cellulitis of left leg 01/27/2015  . CHF (congestive heart failure) ([place])   . Chronic lower back pain    "I have a steel rod in my back"  . Claudication ([place]) 06/02/2011  . COLONIC POLYPS, HX OF 06/24/2007  . Complication of anesthesia    "they can't put me asleep cause I don't wake up" (01/27/2015)  . CONSTIPATION, CHRONIC, HX OF 06/24/2007  . COPD (chronic obstructive pulmonary disease) ([place])   . Coronary artery disease   . CVA (cerebral vascular accident) [facility]) 1990's   "w/MI"; denies residual on 01/27/2015  . DEPRESSION 06/24/2007  . DIABETES MELLITUS, TYPE II dx'd 2015  . DIVERTICULOSIS, COLON 06/24/2007  . DVT (deep venous thrombosis) ([place])   . Heart murmur   . HYPERLIPIDEMIA 06/24/2007  . HYPERTENSION 06/24/2007  . INSOMNIA-SLEEP DISORDER-UNSPEC 01/24/2010  . Migraine    "comes over my right eye q now and then" (01/27/2015)  . Morbid obesity ([place]) 12/21/2015  . Myocardial infarction ([place]) 1990's X 1   "w/stroke"  . OSA (obstructive sleep apnea)    . SKIN LESION 01/24/2010  . Unspecified Peripheral Vascular Disease 06/24/2007    Medications:  Scheduled:  . heparin  3,000 Units Intravenous Once   Infusions:  . heparin    . potassium chloride 10 mEq (04/28/2019 1438)    Assessment: 68 y.o. male presenting with weakness. PMH significant for HTN, HLD, COPD, CVA, DVT, remote history of tobacco use, arthritis, and morbid obesity. No anticoagulation PTA. 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 Hgb 14.1, Scr 1.34. Platelets are chronically low (~60-80s), however Plts are lower than usual at 31 (MD aware) - will dose heparin bolus on the conservative range.   Goal of Therapy:   Heparin level 0.3-0.7 units/ml Monitor platelets by anticoagulation protocol: Yes   Plan:  Heparin bolus 3000 units Heparin gtt 1800 units/hr Check 6hr HL Daily HL and CBC Monitor s/sx of bleeding and platelets   Lorel Monaco, PharmD PGY1 Ambulatory Care Resident [employer] # [phone]

## 2019-05-21 ENCOUNTER — Telehealth: Payer: Self-pay | Admitting: Emergency Medicine

## 2019-05-21 ENCOUNTER — Inpatient Hospital Stay (HOSPITAL_COMMUNITY): Payer: Medicare Other

## 2019-05-21 DIAGNOSIS — R918 Other nonspecific abnormal finding of lung field: Secondary | ICD-10-CM

## 2019-05-21 DIAGNOSIS — Z7189 Other specified counseling: Secondary | ICD-10-CM

## 2019-05-21 DIAGNOSIS — R0602 Shortness of breath: Secondary | ICD-10-CM

## 2019-05-21 DIAGNOSIS — J9601 Acute respiratory failure with hypoxia: Secondary | ICD-10-CM

## 2019-05-21 DIAGNOSIS — Z515 Encounter for palliative care: Secondary | ICD-10-CM

## 2019-05-21 LAB — BASIC METABOLIC PANEL
Anion gap: 8 (ref 5–15)
BUN: 34 mg/dL — ABNORMAL HIGH (ref 8–23)
CO2: 28 mmol/L (ref 22–32)
Calcium: 8.6 mg/dL — ABNORMAL LOW (ref 8.9–10.3)
Chloride: 106 mmol/L (ref 98–111)
Creatinine, Ser: 1.29 mg/dL — ABNORMAL HIGH (ref 0.61–1.24)
GFR calc Af Amer: >60 mL/min (ref >60)
GFR calc non Af Amer: 56 mL/min — ABNORMAL LOW (ref >60)
Glucose, Bld: 126 mg/dL — ABNORMAL HIGH (ref 70–99)
Potassium: 2.8 mmol/L — ABNORMAL LOW (ref 3.5–5.1)
Sodium: 142 mmol/L (ref 135–145)

## 2019-05-21 LAB — CBC
HCT: 42.6 % (ref 39.0–52.0)
Hemoglobin: 14.2 g/dL (ref 13.0–17.0)
MCH: 34.9 pg — ABNORMAL HIGH (ref 26.0–34.0)
MCHC: 33.3 g/dL (ref 30.0–36.0)
MCV: 104.7 fL — ABNORMAL HIGH (ref 80.0–100.0)
Platelets: 30 10*3/uL — ABNORMAL LOW (ref 150–400)
RBC: 4.07 MIL/uL — ABNORMAL LOW (ref 4.22–5.81)
RDW: 14.6 % (ref 11.5–15.5)
WBC: 4.6 10*3/uL (ref 4.0–10.5)
nRBC: 0 % (ref 0.0–0.2)

## 2019-05-21 LAB — HEPARIN LEVEL (UNFRACTIONATED)
Heparin Unfractionated: 0.88 IU/mL — ABNORMAL HIGH (ref 0.30–0.70)
Heparin Unfractionated: 0.89 IU/mL — ABNORMAL HIGH (ref 0.30–0.70)

## 2019-05-21 LAB — GLUCOSE, CAPILLARY
Glucose-Capillary: 124 mg/dL — ABNORMAL HIGH (ref 70–99)
Glucose-Capillary: 115 mg/dL — ABNORMAL HIGH (ref 70–99)

## 2019-05-21 LAB — CBG MONITORING, ED
Glucose-Capillary: 143 mg/dL — ABNORMAL HIGH (ref 70–99)
Glucose-Capillary: 115 mg/dL — ABNORMAL HIGH (ref 70–99)
Glucose-Capillary: 125 mg/dL — ABNORMAL HIGH (ref 70–99)

## 2019-05-21 LAB — PROCALCITONIN: Procalcitonin: 9.79 ng/mL

## 2019-05-21 MED ORDER — METOPROLOL TARTRATE 5 MG/5ML IV SOLN
5.0000 mg | Freq: Four times a day (QID) | INTRAVENOUS | Status: DC
  Administered 2019-05-21 – 2019-05-23 (×7): 5 mg via INTRAVENOUS
  Filled 2019-05-21 (×8): qty 5

## 2019-05-21 MED ORDER — POLYVINYL ALCOHOL 1.4 % OP SOLN
1.0000 [drp] | OPHTHALMIC | Status: DC | PRN
  Administered 2019-05-21: 1 [drp] via OPHTHALMIC
  Filled 2019-05-21 (×2): qty 15

## 2019-05-21 MED ORDER — HYDRALAZINE HCL 20 MG/ML IJ SOLN
10.0000 mg | Freq: Three times a day (TID) | INTRAMUSCULAR | Status: DC
  Administered 2019-05-21 – 2019-05-22 (×4): 10 mg via INTRAVENOUS
  Filled 2019-05-21 (×5): qty 1

## 2019-05-21 MED ORDER — HYDRALAZINE HCL 20 MG/ML IJ SOLN: 10.0000 mg | Freq: Three times a day (TID) | INTRAMUSCULAR | Status: DC | PRN

## 2019-05-21 MED ORDER — POTASSIUM CHLORIDE 10 MEQ/100ML IV SOLN
10.0000 meq | INTRAVENOUS | Status: AC
  Administered 2019-05-21 (×2): 10 meq via INTRAVENOUS
  Filled 2019-05-21 (×3): qty 100

## 2019-05-21 MED ORDER — POTASSIUM CHLORIDE 10 MEQ/100ML IV SOLN
10.0000 meq | INTRAVENOUS | Status: AC
  Administered 2019-05-21 – 2019-05-22 (×4): 10 meq via INTRAVENOUS
  Filled 2019-05-21 (×4): qty 100

## 2019-05-21 MED ORDER — FUROSEMIDE 10 MG/ML IJ SOLN
20.0000 mg | Freq: Every day | INTRAMUSCULAR | Status: DC
  Administered 2019-05-21: 20 mg via INTRAVENOUS
  Filled 2019-05-21: qty 2

## 2019-05-21 MED ORDER — CLONIDINE HCL 0.1 MG/24HR TD PTWK
0.1000 mg | MEDICATED_PATCH | TRANSDERMAL | Status: DC
  Administered 2019-05-21: 0.1 mg via TRANSDERMAL
  Filled 2019-05-21: qty 1

## 2019-05-21 MED ORDER — POTASSIUM CHLORIDE CRYS ER 20 MEQ PO TBCR: 40.0000 meq | EXTENDED_RELEASE_TABLET | Freq: Two times a day (BID) | ORAL | Status: DC

## 2019-05-21 NOTE — ED Notes (Signed)
Pts bed changed and gown- pt cleaned with non rinse soap and barrier cream applied to groin and scrotum with emt [place] due to red and chapped skin around scrotum.

## 2019-05-21 NOTE — Progress Notes (Signed)
Responded to spiritual care consult to support patient. Patient awake and communicating slow but clear.  Patient not satisfied with breakfast.  Patient nurse is aware.  Provided presence and  Brief conversation.  Will follow as needed.  Jaclynn Major, [place], [facility], Pager [phone]

## 2019-05-21 NOTE — Progress Notes (Signed)
NAME:  Eric [name], MRN:  [id number], DOB:  09/15/1950, LOS: 1 ADMISSION DATE:  05/03/2019, CONSULTATION DATE: 05/06/2019 REFERRING MD:  Trude Mcburney, PA, CHIEF COMPLAINT: Right hilar mass  Brief History   68 year old smoker with newly identified right hilar mass (9/17), possible associated thrombus at the brachiocephalic confluence and in the SVC.  History of present illness   68 year old smoker with a right hilar mass identified on CT scan of the chest 9/17, possible associated thrombus at the brachiocephalic confluence, possible clot or tumor in the SVC.  He was in the emergency department 9/17 for dyspnea, chest discomfort and falls.  He left AMA even after the details of his CT chest were described to him.  He returns now with progressive weakness, falling.  PCCM consulted to assist with evaluation of his CT abnormalities, presumed lung cancer  Past Medical History   has a past medical history of ALLERGIC RHINITIS (06/24/2007), Anal fistula (06/24/2007), ANXIETY (06/24/2007), Arthritis, CELLULITIS AND ABSCESS OF LEG EXCEPT FOOT (03/28/2010), Cellulitis of left leg (01/27/2015), CHF (congestive heart failure) ([place]), Chronic lower back pain, Claudication ([place]) (06/02/2011), COLONIC POLYPS, HX OF (51/03/8415), Complication of anesthesia, CONSTIPATION, CHRONIC, HX OF (06/24/2007), COPD (chronic obstructive pulmonary disease) ([place]), Coronary artery disease, CVA (cerebral vascular accident) ([place]) (1990's), DEPRESSION (06/24/2007), DIABETES MELLITUS, TYPE II (dx'd 2015), DIVERTICULOSIS, COLON (06/24/2007), DVT (deep venous thrombosis) ([place]), Heart murmur, HYPERLIPIDEMIA (06/24/2007), HYPERTENSION (06/24/2007), INSOMNIA-SLEEP DISORDER-UNSPEC (01/24/2010), Migraine, Morbid obesity ([place]) (12/21/2015), Myocardial infarction ([place]) (1990's X 1), OSA (obstructive sleep apnea), SKIN LESION (01/24/2010), and Unspecified Peripheral Vascular Disease (06/24/2007).   Significant Hospital Events     Consults:  PCCM  Procedures:     Significant Diagnostic Tests:  CT chest 9/17 >> large right hilar mass, mediastinal invasion and bulky mediastinal lymphadenopathy, probable right mainstem endobronchial lesion.  Encasement of the right pulmonary artery and hilar vessels, question thrombus at the brachiocephalic confluence with narrowing and/or tumor with thrombus at the SVC.  Probable metastatic disease in the right upper lobe.  Bilateral low axillary lymphadenopathy, likely metastatic CT abdomen 9/17 >> bilateral adrenal masses, likely metastatic numerous perinephric soft tissue masses of multiple suspicious retroperitoneal peritoneal metastatic lesions.  2.8 cm probable metastatic mass in the left gluteal musculature  Micro Data:    Antimicrobials:     Interim history/subjective:  He has not felt very much about goals for care, whether he would or would more biopsy.  His overall goal is to get home.  He understands that he has limited time left  Objective   Blood pressure (!) 185/93, pulse 87, temperature 98.3 F (36.8 C), temperature source Oral, resp. rate 17, height 6\' 1"  (1.854 m), weight 102.5 kg, SpO2 98 %.        Intake/Output Summary (Last 24 hours) at 05/21/2019 1100 Last data filed at 05/03/2019 2051 Gross per 24 hour  Intake 373.67 ml  Output -  Net 373.67 ml   Filed Weights   05/19/2019 0957  Weight: 102.5 kg    Examination: General: Ill-appearing obese man, more comfortable now on 1.00 facemask HENT: Large neck, cervical lymphadenopathy, hoarse voice, managing secretions adequately, pupils equal, scleral hemorrhages present without icterus Lungs: Clear on the left, very little air movement on the right, some coarse bronchial breath sounds Cardiovascular: Borderline tachycardic, regular, no murmur, distant Abdomen: Obese, soft, diffusely mildly tender but no rebound or guarding.  Positive bowel sounds Extremities: 2-3+ bilateral pretibial edema Neuro: Awake, alert, interacts and follows commands,  moves all extremities Skin: Widespread telangiectasias on  his shoulders, face, chest  Resolved Hospital Problem list     Assessment & Plan:  Right hilar mass with probable primary lung cancer and associated local and distant metastatic disease.    -Please refer also to my conversation with him and his wife on 9/29.  Asked him today if he had thought about his goals, whether he would or would not want a tissue diagnosis/biopsy of some kind to consider palliative treatment.  Based on all of my conversations with him my impression is is that he wants to be comfortable, wants to be at home, does not want to extend his life especially if it results in more discomfort.  I think the palliative care consultation will be important.  Based on his statements to me I think home hospice would be most appropriate course.  Falls. 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  Some potential risk given the fact that he has not had MRI brain yet, but CT head was reassuring.  If he elects for a transition to comfort care then I would not continue his anticoagulation  Situational depression. -The patient clearly is affected by this new diagnosis of malignancy.  He is also under strain in his relationship with his spouse.  He noted to me 9/29 that he has contemplated suicide.  He mentioned it again today 9/30.  Recommend psychiatry support and evaluation in conjunction with the palliative care evaluation.  Labs   CBC: Recent Labs  Lab 05/13/2019 1012 05/21/19 0404  WBC 4.0 4.6  NEUTROABS 3.6  --   HGB 14.1 14.2  HCT 43.3 42.6  MCV 106.4* 104.7*  PLT 31* 30*    Basic Metabolic Panel: Recent Labs  Lab 04/30/2019 1012 05/21/19 0404  NA 144 142  K 2.1* 2.8*  CL 102 106  CO2 31 28  GLUCOSE 105* 126*  BUN 35* 34*  CREATININE 1.34* 1.29*  CALCIUM 9.2 8.6*  MG 2.2  --    GFR:  Estimated Creatinine Clearance: 68 mL/min (A) (by C-G formula based on SCr of 1.29 mg/dL (H)). Recent Labs  Lab 05/19/2019 1012 05/08/2019 1812 05/21/19 0404  PROCALCITON  --  8.63 9.79  WBC 4.0  --  4.6    Liver Function Tests: Recent Labs  Lab 05/06/2019 1012  AST 24  ALT 28  ALKPHOS 47  BILITOT 1.0  PROT 5.5*  ALBUMIN 3.5   No results for input(s): LIPASE, AMYLASE in the last 168 hours. No results for input(s): AMMONIA in the last 168 hours.  ABG No results found for: PHART, PCO2ART, PO2ART, HCO3, TCO2, ACIDBASEDEF, O2SAT   Coagulation Profile: No results for input(s): INR, PROTIME in the last 168 hours.  Cardiac Enzymes: No results for input(s): CKTOTAL, CKMB, CKMBINDEX, TROPONINI in the last 168 hours.  HbA1C: HbA1c, POC (prediabetic range)  Date/Time Value Ref Range Status  07/23/2018 03:37 PM 0 (A) 5.7 - 6.4 % Final   HbA1c, POC (controlled diabetic range)  Date/Time Value Ref Range Status  07/23/2018 03:37 PM 0.0 0.0 - 7.0 % Final    Comment:    `   HbA1c POC (<> result, manual entry)  Date/Time Value Ref Range Status  07/23/2018 03:37 PM 0 4.0 - 5.6 % Final   Hgb A1c MFr Bld  Date/Time Value Ref Range Status  05/08/2019 07:57 PM 5.6 4.8 - 5.6 % Final    Comment:    (  NOTE)         Prediabetes: 5.7 - 6.4         Diabetes: >6.4         Glycemic control for adults with diabetes: <7.0   01/24/2019 10:47 AM 6.2 4.6 - 6.5 % Final    Comment:    Glycemic Control Guidelines for People with Diabetes:Non Diabetic:  <6%Goal of Therapy: <7%Additional Action Suggested:  >8%     CBG: Recent Labs  Lab 04/24/2019 1714 05/21/19 0228 05/21/19 0742  GLUCAP 108* 143* 115*     Baltazar Apo, MD, PhD 05/21/2019, 11:00 AM [facility] [phone] or if no answer [phone]

## 2019-05-21 NOTE — ED Notes (Signed)
Lunch Tray Ordered @ 1038.  

## 2019-05-21 NOTE — ED Notes (Signed)
Pt requested mouth swabs/hygeine. This nurse and Lonn Georgia RN completed oral hygeine

## 2019-05-21 NOTE — Progress Notes (Signed)
ANTICOAGULATION CONSULT NOTE - Follow-up Consult  Pharmacy Consult for Heparin Indication: SVC thrombus  Allergies  Allergen Reactions  . Latex Swelling  . Oxycodone Other (See Comments)    Sedation/somnolence    Patient Measurements: Height: 6\' 1"  (185.4 cm) Weight: 226 lb (102.5 kg) IBW/kg (Calculated) : 79.9 Heparin Dosing Weight: 100.7 kg  Vital Signs: Temp: 97.6 F (36.4 C) (09/30 1553) Temp Source: Oral (09/30 1553) BP: 187/83 (09/30 1553) Pulse Rate: 98 (09/30 1553)  Labs: Recent Labs    04/27/2019 1012 05/13/2019 1930 05/21/19 0404 05/21/19 1602  HGB 14.1  --  14.2  --   HCT 43.3  --  42.6  --   PLT 31*  --  30*  --   HEPARINUNFRC  --  0.58 0.88* 0.89*  CREATININE 1.34*  --  1.29*  --     Estimated Creatinine Clearance: 68 mL/min (A) (by C-G formula based on SCr of 1.29 mg/dL (H)).  Assessment: 68 y.o. male presenting with weakness. He continues on IV heparin for possible thrombus at the brachiocephalic confluence or tumor thrombus of the SVC.  Hep lvl remains elevated at 0.89 despite previous rate drop  Goal of Therapy:   Heparin level 0.3-0.7 units/ml Monitor platelets by anticoagulation protocol: Yes   Plan:  Reduce Heparin gtt to1300 units/hr 0000 HL  Levester Fresh, PharmD, BCPS, BCCCP Clinical Pharmacist [phone]  Please check AMION for all [place] numbers  05/21/2019 4:43 PM

## 2019-05-21 NOTE — Progress Notes (Signed)
ANTICOAGULATION CONSULT NOTE - Follow-up Consult  Pharmacy Consult for Heparin Indication: SVC thrombus  Allergies  Allergen Reactions  . Latex Swelling  . Oxycodone Other (See Comments)    Sedation/somnolence    Patient Measurements: Height: 6\' 1"  (185.4 cm) Weight: 226 lb (102.5 kg) IBW/kg (Calculated) : 79.9 Heparin Dosing Weight: 100.7 kg  Vital Signs: BP: 138/74 (09/30 0630) Pulse Rate: 73 (09/30 0530)  Labs: Recent Labs    05/06/2019 1012 04/28/2019 1930 05/21/19 0404  HGB 14.1  --  14.2  HCT 43.3  --  42.6  PLT 31*  --  30*  HEPARINUNFRC  --  0.58 0.88*  CREATININE 1.34*  --  1.29*    Estimated Creatinine Clearance: 68 mL/min (A) (by C-G formula based on SCr of 1.29 mg/dL (H)).  Medications: Infusions:  . heparin 1,800 Units/hr (05/21/19 7076)    Assessment: 68 y.o. male presenting with weakness. He continues on IV heparin for possible thrombus at the brachiocephalic confluence or tumor thrombus of the SVC. Heparin level is now elevated at 0.88. No bleeding noted.   Goal of Therapy:   Heparin level 0.3-0.7 units/ml Monitor platelets by anticoagulation protocol: Yes   Plan:  Reduce Heparin gtt to 1600 units/hr Check an 8 hr heparin level Daily heparin level and CBC  Salome Arnt, PharmD, BCPS Please see AMION for all pharmacy numbers 05/21/2019 7:23 AM

## 2019-05-21 NOTE — Consult Note (Signed)
Consultation Note Date: 05/21/2019   Patient Name: Eric [name]  DOB: [birth year]  MRN: [id number]  Age / Sex: 68 y.o., male  PCP: Eric Borg, MD Referring Physician: Jonnie Finner, DO  Reason for Consultation: Establishing goals of care  HPI/Patient Profile: 68 y.o. male  admitted on 05/18/2019    Clinical Assessment and Goals of Care:  68 yo gentleman who lives at home with his wife. He has been admitted with R hilar mass, probable primary lung cancer, possible thrombus at brachiocephalic confluence and in the SVC. He is currently in the ED and has been admitted to hospital medicine service with pulmonary specialists also following.   A palliative care consult has been requested for additional goals of care discussions.   The patient is sitting upright in his stretcher, wife at bedside, he is on NRB mask, also has portable fan in front of his face. I introduced myself, palliative care and goals of care discussions as follows:  Palliative medicine is specialized medical care for people living with serious illness. It focuses on providing relief from the symptoms and stress of a serious illness. The goal is to improve quality of life for both the patient and the family.   Goals of care: Broad aims of medical therapy in relation to the patient's values and preferences. Our aim is to provide medical care aimed at enabling patients to achieve the goals that matter most to them, given the circumstances of their particular medical situation and their constraints.   Goals, wishes and values important to the patient and wife attempted to be elicited.   Discussed about patient's current condition, next steps depending on his wishes. See below.   NEXT OF KIN  wife, who is now at bedside.  Patient has adult children from his first marriage, he is estranged from them.  Patient and wife state that they took in a  young man when he was 68 years old, he lives with them and they consider him as their son.    SUMMARY OF RECOMMENDATIONS   Agree with DNR.  Discussed with patient in the ED, wife also at bedside:  Patient doesn't like to wear his NRB mask, states that the fan helps him more with his breathing. Patient says, "she's the boss" pointing towards his wife.  Wife states that they have discussed and would like to proceed with obtaining a biopsy. Both patient and wife understand that patient likely has a primary lung malignancy that appears to be metastatic. Wife Eric [name] states, "we want to find out how bad it is and where else it has spread to. We know it is lung cancer."  Wife states she will not be able to care for the patient at home, she is not able to lift him/assist him. He was having weakness and diarrhea at home.  PMT will continue to follow and continue to address patient's goals of care.  Thank you for the consult.  Code Status/Advance Care Planning:  DNR    Symptom Management:  as above, monitor for symptom needs.   Palliative Prophylaxis:   Bowel Regimen   Psycho-social/Spiritual:   Desire for further Chaplaincy support:yes  Additional Recommendations: Caregiving  Support/Resources  Prognosis:   Unable to determine  Discharge Planning: To Be Determined      Primary Diagnoses: Present on Admission: . Acute respiratory failure with hypoxia ([place]) . Thrombus . Hypokalemia . Bilateral lower extremity edema . AAA (abdominal aortic aneurysm) ([place]) . Mass of right lung . COPD (chronic obstructive pulmonary disease) ([place]) . Thrombocytopenia ([place])   I have reviewed the medical record, interviewed the patient and family, and examined the patient. The following aspects are pertinent.  Past Medical History:  Diagnosis Date  . ALLERGIC RHINITIS 06/24/2007  . 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 ANXIETY 06/24/2007  . Arthritis    "hands, knees, hips, neck; q part of the  body" (01/27/2015)  . CELLULITIS AND ABSCESS OF LEG EXCEPT FOOT 03/28/2010  . Cellulitis of left leg 01/27/2015  . CHF (congestive heart failure) ([place])   . Chronic lower back pain    "I have a steel rod in my back"  . Claudication ([place]) 06/02/2011  . COLONIC POLYPS, HX OF 06/24/2007  . Complication of anesthesia    "they can't put me asleep cause I don't wake up" (01/27/2015)  . CONSTIPATION, CHRONIC, HX OF 06/24/2007  . COPD (chronic obstructive pulmonary disease) ([place])   . Coronary artery disease   . CVA (cerebral vascular accident) [facility]) 1990's   "w/MI"; denies residual on 01/27/2015  . DEPRESSION 06/24/2007  . DIABETES MELLITUS, TYPE II dx'd 2015  . DIVERTICULOSIS, COLON 06/24/2007  . DVT (deep venous thrombosis) ([place])   . Heart murmur   . HYPERLIPIDEMIA 06/24/2007  . HYPERTENSION 06/24/2007  . INSOMNIA-SLEEP DISORDER-UNSPEC 01/24/2010  . Migraine    "comes over my right eye q now and then" (01/27/2015)  . Morbid obesity ([place]) 12/21/2015  . Myocardial infarction ([place]) 1990's X 1   "w/stroke"  . OSA (obstructive sleep apnea)   . SKIN LESION 01/24/2010  . Unspecified Peripheral Vascular Disease 06/24/2007   Social History   Socioeconomic History  . Marital status: Married    Spouse name: Not on file  . Number of children: 3  . Years of education: Not on file  . Highest education level: Not on file  Occupational History  . 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 Financial resource strain: Not on file  . Food insecurity    Worry: Not on file    Inability: Not on file  . Transportation needs    Medical: Not on file    Non-medical: Not on file  Tobacco Use  . Smoking status: Current Every Day Smoker    Years: 48.00    Types: Cigars  . Smokeless tobacco: Never Used  . Tobacco comment: 01/27/2015 pt states he smokes 5 cigar cigarettes daily  Substance and Sexual Activity  . Alcohol use: Yes    Alcohol/week: 0.0 standard drinks     Comment: 01/27/2015 "might have a couple beers/yr"  . Drug use: No  . Sexual activity: Never  Lifestyle  . Physical activity    Days per week: Not on file    Minutes per session: Not on file  . Stress: Not on file  Relationships  . Social Herbalist on phone: Not on file    Gets together: Not on file    Attends religious service:  Not on file    Active member of club or organization: Not on file    Attends meetings of clubs or organizations: Not on file    Relationship status: Not on file  Other Topics Concern  . Not on file  Social History Narrative  . Not on file   Family History  Problem Relation Age of Onset  . COPD Father   . Heart disease Father   . Cancer Mother        lung cancer  . Heart disease Maternal Grandmother   . Heart disease Maternal Grandfather   . Heart disease Brother   . Hypertension Brother   . Heart attack Brother   . Peripheral vascular disease Brother    Scheduled Meds: . atorvastatin  40 mg Oral q1800  . clonazePAM  0.25 mg Oral QHS  . fenofibrate  160 mg Oral Daily  . furosemide  20 mg Intravenous Daily  . insulin aspart  0-9 Units Subcutaneous TID WC  . metoprolol tartrate  5 mg Intravenous Q6H  . nicotine  21 mg Transdermal Daily  . potassium chloride  40 mEq Oral BID  . sodium chloride flush  3 mL Intravenous Q12H   Continuous Infusions: . heparin 1,600 Units/hr (05/21/19 0735)  . potassium chloride 10 mEq (05/21/19 1054)   PRN Meds:.acetaminophen **OR** acetaminophen, albuterol, hydrALAZINE, morphine injection Medications Prior to Admission:  Prior to Admission medications   Medication Sig Start Date End Date Taking? Authorizing Provider  aspirin 81 MG tablet Take 81 mg by mouth daily.     Yes [provider]  clonazePAM (KLONOPIN) 0.5 MG tablet TAKE 1-2 TABLET BY MOUTH AT BEDTIME, CAN TAKE UP TO 2 DEPENDING ON ANXIETY Patient taking differently: Take 0.25 mg by mouth at bedtime.  02/19/19  Yes Eric Borg, MD   fenofibrate 160 MG tablet TAKE 1 TABLET BY MOUTH EVERY DAY Patient taking differently: Take 160 mg by mouth daily.  01/30/19  Yes Eric Borg, MD  furosemide (LASIX) 20 MG tablet Take 0.5 tablets (10 mg total) by mouth daily. Patient taking differently: Take 20 mg by mouth daily.  09/12/17  Yes Eric Borg, MD  gabapentin (NEURONTIN) 100 MG capsule TAKE 2 CAPSULES BY MOUTH EVERY DAY AT BEDTIME Patient taking differently: Take 200 mg by mouth at bedtime.  03/10/19  Yes Eric Borg, MD  hydrochlorothiazide (HYDRODIURIL) 25 MG tablet TAKE 1 TABLET BY MOUTH EVERY DAY Patient taking differently: Take 25 mg by mouth daily.  03/24/19  Yes Eric Borg, MD  labetalol (NORMODYNE) 300 MG tablet TAKE 1 TABLET BY MOUTH TWICE A DAY Patient taking differently: Take 300 mg by mouth 2 (two) times daily.  04/22/19  Yes Eric Borg, MD  metFORMIN (GLUCOPHAGE) 1000 MG tablet TAKE 1 TABLET BY MOUTH 2 TIMES DAILY WITH A MEAL. Patient taking differently: Take 500 mg by mouth 2 (two) times daily.  04/14/19  Yes Eric Borg, MD  simvastatin (ZOCOR) 80 MG tablet TAKE 1 TABLET BY MOUTH EVERYDAY AT BEDTIME Patient taking differently: Take 80 mg by mouth at bedtime.  02/03/19  Yes Eric Borg, MD  traZODone (DESYREL) 50 MG tablet TAKE 1/2 TO 1 TABLET AT BEDTIME AS NEEDED FOR SLEEP Patient taking differently: Take 50 mg by mouth at bedtime.  04/01/19  Yes Lyndal Pulley, DO  Blood Glucose Monitoring Suppl (ACCU-CHEK NANO SMARTVIEW) w/Device KIT use as directed to check insulin levels 03/27/17   Eric Borg, MD  Blood Glucose Monitoring Suppl DEVI 1 Device by Does not apply route daily. 02/10/15   Eric Borg, MD  glucose blood test strip Use as instructed 02/10/15   Eric Borg, MD  Lancets [facility] SOFT [facility]) lancets Use as instructed 01/25/17   Eric Borg, MD  Vitamin D, Ergocalciferol, (DRISDOL) 1.25 MG (50000 UT) CAPS capsule Take 1 capsule (50,000 Units total) by mouth every 7 (seven) days. Patient not  taking: Reported on 05/08/2019 01/24/19   Eric Borg, MD   Allergies  Allergen Reactions  . Latex Swelling  . Oxycodone Other (See Comments)    Sedation/somnolence   Review of Systems +shortness of breath +generalized weakness  Physical Exam Patient with NRB mask on Has large body habitus, has fan in front of his face to help with sensation of dyspnea Diminished breath sounds on R S1 S2 distant heart sounds due to body habitus.  Has bilateral LE edema Abdomen is obese Sitting upright in bed with NRB mask on.   Vital Signs: BP (!) 185/93   Pulse 87   Temp 98.3 F (36.8 C) (Oral)   Resp 17   Ht 6' 1"  (1.854 m)   Wt 102.5 kg   SpO2 98%   BMI 29.82 kg/m  Pain Scale: 0-10   Pain Score: 0-No pain   SpO2: SpO2: 98 % O2 Device:SpO2: 98 % O2 Flow Rate: .O2 Flow Rate (L/min): 15 L/min  IO: Intake/output summary:   Intake/Output Summary (Last 24 hours) at 05/21/2019 1143 Last data filed at 05/19/2019 2051 Gross per 24 hour  Intake 373.67 ml  Output -  Net 373.67 ml    LBM:   Baseline Weight: Weight: 102.5 kg Most recent weight: Weight: 102.5 kg     Palliative Assessment/Data:   PPS 30%  Time In:  11 Time Out:  12.10 Time Total:  70 min.  Greater than 50%  of this time was spent counseling and coordinating care related to the above assessment and plan.  Signed by: Loistine Chance, MD  [phone] Please contact Palliative Medicine Team phone at [phone] for questions and concerns.  For individual provider: See Shea Evans

## 2019-05-21 NOTE — ED Notes (Signed)
ED TO INPATIENT HANDOFF REPORT  ED Nurse Name and Phone #: Jinny Blossom #[phone]  S Name/Age/Gender Eric [name] 68 y.o. male Room/Bed: 032C/032C  Code Status   Code Status: DNR  Home/SNF/Other Home Patient oriented to: self, place, time and situation Is this baseline? Yes   Triage Complete: Triage complete  Chief Complaint sick  Triage Note Pt BIB [facility] after a mechanical fall out of his chair at home. Per EMS patient had no injuries from his fall and just slid out of his lift chair this morning. Pt complaining of generalized weakness and shortness of breath. Pt given 1 nitro SL by EMS and blood pressure was in the 200's prior to taking the nitro per EMS. Pt 87% on room air upon ED arrival. Pt does have a wet sounding cough.    Allergies Allergies  Allergen Reactions  . Latex Swelling  . Oxycodone Other (See Comments)    Sedation/somnolence    Level of Care/Admitting Diagnosis ED Disposition    ED Disposition Condition [facility]: [place] [[id number]]  Level of Care: Progressive [102]  Covid Evaluation: Asymptomatic Screening Protocol (No Symptoms)  Diagnosis: Acute respiratory failure with hypoxia [facility]) [[id number]]  Admitting Physician: Norval Morton [[id number]]  Attending Physician: Norval Morton [[id number]]  Estimated length of stay: past midnight tomorrow  Certification:: I certify this patient will need inpatient services for at least 2 midnights  PT Class (Do Not Modify): Inpatient [101]  PT Acc Code (Do Not Modify): Private [1]       B Medical/Surgery History Past Medical History:  Diagnosis Date  . ALLERGIC RHINITIS 06/24/2007  . 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 ANXIETY 06/24/2007  . Arthritis    "hands, knees, hips, neck; q part of the body" (01/27/2015)  . CELLULITIS AND ABSCESS OF LEG EXCEPT FOOT 03/28/2010  . Cellulitis of left leg 01/27/2015  . CHF (congestive heart failure) ([place])   . Chronic lower back pain    "I have a  steel rod in my back"  . Claudication ([place]) 06/02/2011  . COLONIC POLYPS, HX OF 06/24/2007  . Complication of anesthesia    "they can't put me asleep cause I don't wake up" (01/27/2015)  . CONSTIPATION, CHRONIC, HX OF 06/24/2007  . COPD (chronic obstructive pulmonary disease) ([place])   . Coronary artery disease   . CVA (cerebral vascular accident) [facility]) 1990's   "w/MI"; denies residual on 01/27/2015  . DEPRESSION 06/24/2007  . DIABETES MELLITUS, TYPE II dx'd 2015  . DIVERTICULOSIS, COLON 06/24/2007  . DVT (deep venous thrombosis) ([place])   . Heart murmur   . HYPERLIPIDEMIA 06/24/2007  . HYPERTENSION 06/24/2007  . INSOMNIA-SLEEP DISORDER-UNSPEC 01/24/2010  . Migraine    "comes over my right eye q now and then" (01/27/2015)  . Morbid obesity ([place]) 12/21/2015  . Myocardial infarction ([place]) 1990's X 1   "w/stroke"  . OSA (obstructive sleep apnea)   . SKIN LESION 01/24/2010  . Unspecified Peripheral Vascular Disease 06/24/2007   Past Surgical History:  Procedure Laterality Date  . ANTERIOR CERVICAL DECOMP/DISCECTOMY FUSION  1985   "job related injury"  . BACK SURGERY    . KNEE ARTHROSCOPY Bilateral 1990  . LIGAMENT REPAIR Right 1999   "wrist; job related injury"  . LUMBAR LAMINECTOMY  1990's     A IV Location/Drains/Wounds Patient Lines/Drains/Airways Status   Active Line/Drains/Airways    Name:   Placement date:   Placement time:   Site:   Days:  Peripheral IV 04/29/2019 Left Arm   05/19/2019    1950    Arm   1   Wound / Incision (Open or Dehisced) 01/27/15 Incision - Open Leg Left;Lower opened abscess   01/27/15    1103    Leg   1575          Intake/Output Last 24 hours  Intake/Output Summary (Last 24 hours) at 05/21/2019 1454 Last data filed at 05/19/2019 2051 Gross per 24 hour  Intake 273.67 ml  Output -  Net 273.67 ml    Labs/Imaging Results for orders placed or performed during the hospital encounter of 05/13/2019 (from the past 48 hour(s))  Comprehensive metabolic panel     Status:  Abnormal   Collection Time: 05/09/2019 10:12 AM  Result Value Ref Range   Sodium 144 135 - 145 mmol/L   Potassium 2.1 (LL) 3.5 - 5.1 mmol/L    Comment: CRITICAL RESULT CALLED TO, READ BACK BY AND VERIFIED WITH: KOPP,K RN @1208  ON [id number] BY FLEMINGS    Chloride 102 98 - 111 mmol/L   CO2 31 22 - 32 mmol/L   Glucose, Bld 105 (H) 70 - 99 mg/dL   BUN 35 (H) 8 - 23 mg/dL   Creatinine, Ser 1.34 (H) 0.61 - 1.24 mg/dL   Calcium 9.2 8.9 - 10.3 mg/dL   Total Protein 5.5 (L) 6.5 - 8.1 g/dL   Albumin 3.5 3.5 - 5.0 g/dL   AST 24 15 - 41 U/L   ALT 28 0 - 44 U/L   Alkaline Phosphatase 47 38 - 126 U/L   Total Bilirubin 1.0 0.3 - 1.2 mg/dL   GFR calc non Af Amer 54 (L) >60 mL/min   GFR calc Af Amer >60 >60 mL/min   Anion gap 11 5 - 15    Comment: Performed at [facility], [address], [place], [place] [address]  CBC with Differential     Status: Abnormal   Collection Time: 04/29/2019 10:12 AM  Result Value Ref Range   WBC 4.0 4.0 - 10.5 K/uL   RBC 4.07 (L) 4.22 - 5.81 MIL/uL   Hemoglobin 14.1 13.0 - 17.0 g/dL   HCT 43.3 39.0 - 52.0 %   MCV 106.4 (H) 80.0 - 100.0 fL   MCH 34.6 (H) 26.0 - 34.0 pg   MCHC 32.6 30.0 - 36.0 g/dL   RDW 14.5 11.5 - 15.5 %   Platelets 31 (L) 150 - 400 K/uL    Comment: REPEATED TO VERIFY PLATELET COUNT CONFIRMED BY SMEAR SPECIMEN CHECKED FOR CLOTS Immature Platelet Fraction may be clinically indicated, consider ordering this additional test [id number]    nRBC 0.0 0.0 - 0.2 %   Neutrophils Relative % 90 %   Neutro Abs 3.6 1.7 - 7.7 K/uL   Lymphocytes Relative 5 %   Lymphs Abs 0.2 (L) 0.7 - 4.0 K/uL   Monocytes Relative 4 %   Monocytes Absolute 0.2 0.1 - 1.0 K/uL   Eosinophils Relative 0 %   Eosinophils Absolute 0.0 0.0 - 0.5 K/uL   Basophils Relative 0 %   Basophils Absolute 0.0 0.0 - 0.1 K/uL   Immature Granulocytes 1 %   Abs Immature Granulocytes 0.02 0.00 - 0.07 K/uL    Comment: Performed at [facility], [place] [address], [place],   [address]  Magnesium     Status: None   Collection Time: 05/14/2019 10:12 AM  Result Value Ref Range   Magnesium 2.2 1.7 - 2.4 mg/dL  Comment: Performed at [facility], [place] [address], [place], [place] [address]  SARS Coronavirus 2 [facility] order, Performed in [facility] hospital lab) Nasopharyngeal Nasopharyngeal Swab     Status: None   Collection Time: 05/15/2019 12:46 PM   Specimen: Nasopharyngeal Swab  Result Value Ref Range   SARS Coronavirus 2 NEGATIVE NEGATIVE    Comment: (NOTE) If result is NEGATIVE SARS-CoV-2 target nucleic acids are NOT DETECTED. The SARS-CoV-2 RNA is generally detectable in upper and lower  respiratory specimens during the acute phase of infection. The lowest  concentration of SARS-CoV-2 viral copies this assay can detect is 250  copies / mL. A negative result does not preclude SARS-CoV-2 infection  and should not be used as the sole basis for treatment or other  patient management decisions.  A negative result may occur with  improper specimen collection / handling, submission of specimen other  than nasopharyngeal swab, presence of viral mutation(s) within the  areas targeted by this assay, and inadequate number of viral copies  (<250 copies / mL). A negative result must be combined with clinical  observations, patient history, and epidemiological information. If result is POSITIVE SARS-CoV-2 target nucleic acids are DETECTED. The SARS-CoV-2 RNA is generally detectable in upper and lower  respiratory specimens dur ing the acute phase of infection.  Positive  results are indicative of active infection with SARS-CoV-2.  Clinical  correlation with patient history and other diagnostic information is  necessary to determine patient infection status.  Positive results do  not rule out bacterial infection or co-infection with other viruses. If result is PRESUMPTIVE POSTIVE SARS-CoV-2 nucleic acids MAY BE PRESENT.   A presumptive positive result was  obtained on the submitted specimen  and confirmed on repeat testing.  While 2019 novel coronavirus  (SARS-CoV-2) nucleic acids may be present in the submitted sample  additional confirmatory testing may be necessary for epidemiological  and / or clinical management purposes  to differentiate between  SARS-CoV-2 and other Sarbecovirus currently known to infect humans.  If clinically indicated additional testing with an alternate test  methodology [phone]) is advised. The SARS-CoV-2 RNA is generally  detectable in upper and lower respiratory sp ecimens during the acute  phase of infection. The expected result is Negative. Fact Sheet for Patients:  StrictlyIdeas.no Fact Sheet for Healthcare Providers: BankingDealers.co.za This test is not yet approved or cleared by the [place] FDA and has been authorized for detection and/or diagnosis of SARS-CoV-2 by FDA under an Emergency Use Authorization (EUA).  This EUA will remain in effect (meaning this test can be used) for the duration of the COVID-19 declaration under Section 564(b)(1) of the Act, 21 U.S.C. section 360bbb-3(b)(1), unless the authorization is terminated or revoked sooner. Performed at [facility], [place] [address], [place], [place] [address]   Urinalysis, Routine w reflex microscopic     Status: Abnormal   Collection Time: 04/28/2019  1:44 PM  Result Value Ref Range   Color, Urine YELLOW YELLOW   APPearance CLEAR CLEAR   Specific Gravity, Urine 1.019 1.005 - 1.030   pH 6.0 5.0 - 8.0   Glucose, UA NEGATIVE NEGATIVE mg/dL   Hgb urine dipstick MODERATE (A) NEGATIVE   Bilirubin Urine NEGATIVE NEGATIVE   Ketones, ur NEGATIVE NEGATIVE mg/dL   Protein, ur 30 (A) NEGATIVE mg/dL   Nitrite NEGATIVE NEGATIVE   Leukocytes,Ua NEGATIVE NEGATIVE   RBC / HPF 6-10 0 - 5 RBC/hpf   WBC, UA 0-5 0 - 5  WBC/hpf   Bacteria, UA NONE SEEN NONE SEEN    Comment: Performed at [facility] [address], [place], [place] [address]  Strep pneumoniae urinary antigen     Status: None   Collection Time: 04/29/2019  1:44 PM  Result Value Ref Range   Strep Pneumo Urinary Antigen NEGATIVE NEGATIVE    Comment:        Infection due to S. pneumoniae cannot be absolutely ruled out since the antigen present may be below the detection limit of the test. Performed at [facility], [address], [place], [place] [address]   CBG monitoring, ED     Status: Abnormal   Collection Time: 05/03/2019  5:14 PM  Result Value Ref Range   Glucose-Capillary 108 (H) 70 - 99 mg/dL  HIV Antibody (routine testing w rflx)     Status: None   Collection Time: 05/01/2019  6:12 PM  Result Value Ref Range   HIV Screen 4th Generation wRfx NON REACTIVE NON REACTIVE    Comment: Performed at [facility], [place] [address], [place], [place] [address]  Procalcitonin - Baseline     Status: None   Collection Time: 05/04/2019  6:12 PM  Result Value Ref Range   Procalcitonin 8.63 ng/mL    Comment:        Interpretation: PCT > 2 ng/mL: Systemic infection (sepsis) is likely, unless other causes are known. (NOTE)       Sepsis PCT Algorithm           Lower Respiratory Tract                                      Infection PCT Algorithm    ----------------------------     ----------------------------         PCT < 0.25 ng/mL                PCT < 0.10 ng/mL         Strongly encourage             Strongly discourage   discontinuation of antibiotics    initiation of antibiotics    ----------------------------     -----------------------------       PCT 0.25 - 0.50 ng/mL            PCT 0.10 - 0.25 ng/mL               OR       >80% decrease in PCT            Discourage initiation of                                            antibiotics      Encourage discontinuation           of antibiotics    ----------------------------     -----------------------------         PCT >= 0.50 ng/mL              PCT  0.26 - 0.50 ng/mL               AND       <80% decrease in PCT  Encourage initiation of                                             antibiotics       Encourage continuation           of antibiotics    ----------------------------     -----------------------------        PCT >= 0.50 ng/mL                  PCT > 0.50 ng/mL               AND         increase in PCT                  Strongly encourage                                      initiation of antibiotics    Strongly encourage escalation           of antibiotics                                     -----------------------------                                           PCT <= 0.25 ng/mL                                                 OR                                        > 80% decrease in PCT                                     Discontinue / Do not initiate                                             antibiotics Performed at [facility], [address], [place], [place] [address]   Brain natriuretic peptide     Status: Abnormal   Collection Time: 05/14/2019  6:12 PM  Result Value Ref Range   B Natriuretic Peptide 269.3 (H) 0.0 - 100.0 pg/mL    Comment: Performed at [facility] [address] [place], [place] [address]  Heparin level (unfractionated)     Status: None   Collection Time: 05/11/2019  7:30 PM  Result Value Ref Range   Heparin Unfractionated 0.58 0.30 - 0.70 IU/mL    Comment: (NOTE) If heparin results are below expected values, and patient dosage has  been confirmed, suggest follow up testing of antithrombin III levels. Performed at  [facility], [place] [address], [place], [place] [address]   CBG monitoring, ED     Status: Abnormal   Collection Time: 05/21/19  2:28 AM  Result Value Ref Range   Glucose-Capillary 143 (H) 70 - 99 mg/dL  CBC     Status: Abnormal   Collection Time: 05/21/19  4:04 AM  Result Value Ref Range   WBC 4.6 4.0 - 10.5 K/uL   RBC 4.07 (L) 4.22 - 5.81  MIL/uL   Hemoglobin 14.2 13.0 - 17.0 g/dL   HCT 42.6 39.0 - 52.0 %   MCV 104.7 (H) 80.0 - 100.0 fL   MCH 34.9 (H) 26.0 - 34.0 pg   MCHC 33.3 30.0 - 36.0 g/dL   RDW 14.6 11.5 - 15.5 %   Platelets 30 (L) 150 - 400 K/uL    Comment: REPEATED TO VERIFY Immature Platelet Fraction may be clinically indicated, consider ordering this additional test [id number] CONSISTENT WITH PREVIOUS RESULT    nRBC 0.0 0.0 - 0.2 %    Comment: Performed at [facility], [place] [address], [place], [place] [address]  Basic metabolic panel     Status: Abnormal   Collection Time: 05/21/19  4:04 AM  Result Value Ref Range   Sodium 142 135 - 145 mmol/L   Potassium 2.8 (L) 3.5 - 5.1 mmol/L    Comment: DELTA CHECK NOTED   Chloride 106 98 - 111 mmol/L   CO2 28 22 - 32 mmol/L   Glucose, Bld 126 (H) 70 - 99 mg/dL   BUN 34 (H) 8 - 23 mg/dL   Creatinine, Ser 1.29 (H) 0.61 - 1.24 mg/dL   Calcium 8.6 (L) 8.9 - 10.3 mg/dL   GFR calc non Af Amer 56 (L) >60 mL/min   GFR calc Af Amer >60 >60 mL/min   Anion gap 8 5 - 15    Comment: Performed at [facility] [address] [place], [place] [address]  Procalcitonin     Status: None   Collection Time: 05/21/19  4:04 AM  Result Value Ref Range   Procalcitonin 9.79 ng/mL    Comment:        Interpretation: PCT > 2 ng/mL: Systemic infection (sepsis) is likely, unless other causes are known. (NOTE)       Sepsis PCT Algorithm           Lower Respiratory Tract                                      Infection PCT Algorithm    ----------------------------     ----------------------------         PCT < 0.25 ng/mL                PCT < 0.10 ng/mL         Strongly encourage             Strongly discourage   discontinuation of antibiotics    initiation of antibiotics    ----------------------------     -----------------------------       PCT 0.25 - 0.50 ng/mL            PCT 0.10 - 0.25 ng/mL               OR       >80% decrease in PCT  Discourage initiation  of                                            antibiotics      Encourage discontinuation           of antibiotics    ----------------------------     -----------------------------         PCT >= 0.50 ng/mL              PCT 0.26 - 0.50 ng/mL               AND       <80% decrease in PCT              Encourage initiation of                                             antibiotics       Encourage continuation           of antibiotics    ----------------------------     -----------------------------        PCT >= 0.50 ng/mL                  PCT > 0.50 ng/mL               AND         increase in PCT                  Strongly encourage                                      initiation of antibiotics    Strongly encourage escalation           of antibiotics                                     -----------------------------                                           PCT <= 0.25 ng/mL                                                 OR                                        > 80% decrease in PCT                                     Discontinue / Do not initiate  antibiotics Performed at [facility], [place] [address], [place], [place] [address]   Heparin level (unfractionated)     Status: Abnormal   Collection Time: 05/21/19  4:04 AM  Result Value Ref Range   Heparin Unfractionated 0.88 (H) 0.30 - 0.70 IU/mL    Comment: (NOTE) If heparin results are below expected values, and patient dosage has  been confirmed, suggest follow up testing of antithrombin III levels. Performed at [facility], [place] [address] [place], [place] [address]   CBG monitoring, ED     Status: Abnormal   Collection Time: 05/21/19  7:42 AM  Result Value Ref Range   Glucose-Capillary 115 (H) 70 - 99 mg/dL  CBG monitoring, ED     Status: Abnormal   Collection Time: 05/21/19 12:22 PM  Result Value Ref Range   Glucose-Capillary 125 (H) 70 - 99 mg/dL   Ct Head Wo  Contrast  Result Date: 05/12/2019 CLINICAL DATA:  [place] from chair at home.  Generalize weakness. EXAM: CT HEAD WITHOUT CONTRAST TECHNIQUE: Contiguous axial images were obtained from the base of the skull through the vertex without intravenous contrast. COMPARISON:  Head CT 05/08/2019 FINDINGS: Brain: Remote MCA territory infarct on the right side with encephalomalacia. There is also age advanced fairly extensive periventricular white matter disease. The ventricles are in the midline in are normal in configuration. No extra-axial fluid collections are identified. No intracranial mass lesions. The brainstem and cerebellum are grossly normal. Vascular: Stable vascular calcifications. No definite aneurysm or hyperdense vessels. Skull: No skull fracture or bone lesions. Sinuses/Orbits: The paranasal sinuses and mastoid air cells are clear. The globes are intact. Other: No scalp lesions or hematoma. IMPRESSION: 1. Remote MCA territory infarct on the right side with encephalomalacia. 2. Age advanced periventricular white matter disease. 3. No acute intracranial findings or mass lesions. Electronically Signed   By: Marijo Sanes M.D.   On: 04/28/2019 13:08   Dg Chest Port 1 View  Result Date: 05/02/2019 CLINICAL DATA:  Chest pain and weakness for a few days. EXAM: PORTABLE CHEST 1 VIEW COMPARISON:  CT scan 05/08/2019 FINDINGS: The heart is mildly enlarged but stable. Stable tortuosity and calcification of the thoracic aorta. Stable large right hilar and mediastinal mass and associated obstructive pneumonitis in the right upper lobe. The left lung is grossly clear. The bony structures are intact. IMPRESSION: Stable large right hilar and mediastinal mass/adenopathy and right upper lobe obstructive pneumonitis. Electronically Signed   By: Marijo Sanes M.D.   On: 05/03/2019 12:44    Pending Labs Unresulted Labs (From admission, onward)    Start     Ordered   05/21/19 1600  Heparin level (unfractionated)   Once-Timed,   STAT     05/21/19 0720   05/21/19 0500  CBC  Daily,   R     05/01/2019 1456   05/21/19 0500  Procalcitonin  Daily,   R     05/05/2019 1717   05/21/19 0021  Heparin level (unfractionated)  Daily,   R     05/21/19 0021   05/15/2019 1714  Culture, sputum-assessment  Once,   R    Question:  Patient immune status  Answer:  Immunocompromised   04/27/2019 1717   05/08/2019 1714  Legionella Pneumophila Serogp 1 Ur Ag  Once,   STAT     04/28/2019 1717   05/17/2019 1640  HIV4GL Save Tube  (HIV Antibody (Routine testing w reflex) panel)  Once,   STAT  04/23/2019 1642          Vitals/Pain Today's Vitals   05/21/19 1115 05/21/19 1200 05/21/19 1345 05/21/19 1445  BP: (!) 187/85 (!) 176/94 (!) 177/69 (!) 174/86  Pulse: 73 86 93 93  Resp: 17 19 19 18   Temp:      TempSrc:      SpO2: 96% 93% 95% 94%  Weight:      Height:      PainSc:        Isolation Precautions No active isolations  Medications Medications  heparin ADULT infusion 100 units/mL (25000 units/216mL sodium chloride 0.45%) (1,600 Units/hr Intravenous Rate/Dose Change 05/21/19 0735)  fenofibrate tablet 160 mg (160 mg Oral Not Given 05/21/19 0952)  clonazePAM (KLONOPIN) tablet 0.25 mg (0.25 mg Oral Given 05/21/19 0050)  atorvastatin (LIPITOR) tablet 40 mg (40 mg Oral Not Given 05/18/2019 1925)  sodium chloride flush (NS) 0.9 % injection 3 mL (3 mLs Intravenous Given 05/21/19 0956)  acetaminophen (TYLENOL) tablet 650 mg (has no administration in time range)    Or  acetaminophen (TYLENOL) suppository 650 mg (has no administration in time range)  albuterol (PROVENTIL) (2.5 MG/3ML) 0.083% nebulizer solution 2.5 mg (has no administration in time range)  insulin aspart (novoLOG) injection 0-9 Units (0 Units Subcutaneous Not Given 05/21/19 1232)  morphine 2 MG/ML injection 2 mg (2 mg Intravenous Given 05/21/2019 1941)  nicotine (NICODERM CQ - dosed in mg/24 hours) patch 21 mg (0 mg Transdermal Hold 05/21/19 1044)  potassium chloride 10 mEq  in 100 mL IVPB ( Intravenous Rate/Dose Change 05/21/19 1219)  potassium chloride SA (KLOR-CON) CR tablet 40 mEq (40 mEq Oral Not Given 05/21/19 0956)  hydrALAZINE (APRESOLINE) injection 10 mg (has no administration in time range)  furosemide (LASIX) injection 20 mg (20 mg Intravenous Given 05/21/19 1055)  metoprolol tartrate (LOPRESSOR) injection 5 mg (5 mg Intravenous Given 05/21/19 1055)  potassium chloride 10 mEq in 100 mL IVPB (0 mEq Intravenous Stopped 05/17/2019 1925)  potassium chloride 20 MEQ/15ML (10%) solution 40 mEq (40 mEq Oral Given 05/10/2019 1233)  heparin bolus via infusion 3,000 Units (3,000 Units Intravenous Bolus from Bag 05/08/2019 1521)  morphine 4 MG/ML injection 4 mg (4 mg Intravenous Given 05/09/2019 1809)    Mobility walks High fall risk   Focused Assessments     R Recommendations: See Admitting Provider Note  Report given to:   Additional Notes:

## 2019-05-21 NOTE — ED Notes (Signed)
Breakfast Ordered 

## 2019-05-21 NOTE — ED Notes (Signed)
SDU 

## 2019-05-21 NOTE — Progress Notes (Addendum)
Marland Kitchen  PROGRESS NOTE    ABDELRAHMAN NAIR  WSF:[id number] DOB: 17-May-1951 DOA: 04/29/2019 PCP: Biagio Borg, MD   Brief Narrative:   NEVILLE WALSTON is a 68 y.o. male with medical history significant of HTN, HLD, COPD, CVA, diabetes mellitus type 2, DVT, remote history of tobacco use, arthritis, and morbid obesity; who presents with complaints of worsening weakness.  He was seen 2 weeks ago in the emergency department for cough and chest pain after having a fall at home.  CT scan of the head, chest, and abdomen revealed large right perihilar mass with mediastinal invasion, narrowing, and encasement of the right pulmonary artery and right hilar vessels.  Possible thrombus in the brachiocephalic confluence with marked narrowing and/or tumor thrombus of the SVC.  Suspicious airspace disease also noted.  Bilateral adrenal gland masses concerning for metastatic disease.  Indeterminate bilateral renal lesions with mild to moderate right hydronephrosis secondary to obstruction at the proximal right ureter at the level of the UPJ. Pulmonology had been consulted and it was recommended start patient on anticoagulation.  However, the patient left against medical advice.   Since being home patient reports that he has had multiple falls.  He complains of chest pain, worsening shortness of breath, lower extremity swelling, and persistent cough.  He reports that he has had progressive weight loss over last 6 months and shortness of breath over the last 3 months.  He had slid out of his chair this morning was unable to get back up.  Denies any injuries, but EMS was called thereafter by his wife.  Denies having any injuries.  In route with EMS patient was given 1 sublingual nitroglycerin and his blood pressures were noted to be elevated into the 200s.  He does admit to still smoking half pack cigarettes per day on average.  9/30: Family talked with PC. 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       - He was initially seen on 9/17 for progressively worsening cough found to have large perihilar mass with metastatic lesions, but had declined patient at that time.     - Palliative care consulted; pt/family wishing to go forward with Bx     - MRI brain ordered, but unable to tolerate lying flat     - Check sputum culture and procalcitonin     - apprecaite pulm assistance     - BiPap?  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     - Heparin per pharmacy  Hypokalemia:     - replace, monitor  Hypertensive urgency:      - Acute. Initial blood pressures elevated at 202/91     - not able to take PO right now (awaiting SLP eval)     - IV metoprolol 5mg  q6h; IV hydralazine 10mg  q8h     - may need to move to ICU for cardene  Renal insufficiency, renal hydronephrosis     - Patient creatinine mildly elevated at 1.34 with BUN 35.  Elevated BUN to creatinine ratio suggest prerenal cause of symptoms.  Patient given IV fluids with replacement of his electrolytes.     - May warrant urology consultation for stent placement depending on goals of care  COPD:      - Stable.  Patient without acute wheezing     - Albuterol nebs as needed  Congestive heart failure     - Last BNP elevated at 422 on 9/17.  On physical exam patient with 2+ pitting edema of the bilateral lower extremities JVD present, but question secondary  to mass.     - I&Os, daily wt  Depression     - Patient apparently reported contemplating suicide with pulmonology.     - Sitter to bedside     - Spiritual consult     - psyc consult d/t SI; situational depression  Thrombocytopenia     - Acute on chronic.       - Platelet count 31.       - Patient with multiple areas of bruising noted in upper and lower extremities.  CT imaging did note signs of cirrhosis.     - Continue to monitor  Diabetes mellitus type 2:      - Last hemoglobin A1c noted to be 5.6 on 05/08/2019.     - hold home metformin     - SSI  Dyslipidemia     - Continue fenofibrate and pharmacy substitution for simvastatin  Tobacco use     - Patient still reports smoking half pack cigarettes per day on average.     - Nicotine patch offered  AAA     - As noted on CT angiogram from 9/17 noted to be 3.5 cm in diameter  Dysphagia?     - pt choking on water at bedside     - SLP consulted; NPO for now  BP is still up. He's NPO right now d/t dysphagia. Add IV scheduled metorpolol and hydralazine. May need to move to ICU for cardene gtt. Family/pt requesting Bx for lung mass. Psyc consulted for SI  DVT prophylaxis: SCDs Code Status: DNR   Disposition Plan: TBD  Consultants:   PC  PCCM  Psyc  ROS:  Reports dyspnea. Denies CP, N, V, palpitations . Remainder 10-pt ROS is negative for all not previously mentioned.  Subjective: "I want to die."  Objective: Vitals:   05/21/19 1400 05/21/19 1445 05/21/19 1500 05/21/19 1553  BP: (!) 158/90 (!) 174/86 (!) 180/91 (!) 187/83  Pulse: 94 93 95 98  Resp: (!) 22 18 17  (!) 25  Temp:    97.6 F (36.4 C)  TempSrc:    Oral  SpO2: 94% 94% 97% 93%  Weight:      Height:        Intake/Output Summary (Last 24 hours) at 05/21/2019 1624 Last data filed at 05/07/2019 2051 Gross per 24 hour  Intake 173.67 ml  Output -  Net 173.67 ml   Filed Weights   05/14/2019 0957  Weight: 102.5 kg    Examination:  General:  69 y.o. male resting in bed some anxiety noted Cardiovascular: RRR, +S1, S2, no m/g/r, equal pulses throughout Respiratory: minor inspiratory wheeze, increased WOB, on NRB GI: BS+, NDNT, no masses noted, no organomegaly noted MSK: No c/c; BLE edema Skin: No rashes, bruises, ulcerations noted Neuro: A&O x 3, no focal deficits Psyc: Appropriate interaction  and affect, anxious, cooperative   Data Reviewed: I have personally reviewed following labs and imaging studies.  CBC: Recent Labs  Lab 05/06/2019 1012 05/21/19 0404  WBC 4.0 4.6  NEUTROABS 3.6  --   HGB 14.1 14.2  HCT 43.3 42.6  MCV 106.4* 104.7*  PLT 31* 30*   Basic Metabolic Panel: Recent Labs  Lab 05/08/2019 1012 05/21/19 0404  NA 144 142  K 2.1* 2.8*  CL 102 106  CO2 31 28  GLUCOSE 105* 126*  BUN 35* 34*  CREATININE 1.34* 1.29*  CALCIUM 9.2 8.6*  MG 2.2  --    GFR: Estimated Creatinine Clearance: 68 mL/min (A) (by C-G formula based on SCr of 1.29 mg/dL (H)). Liver Function Tests: Recent Labs  Lab 05/05/2019 1012  AST 24  ALT 28  ALKPHOS 47  BILITOT 1.0  PROT 5.5*  ALBUMIN 3.5   No results for input(s): LIPASE, AMYLASE in the last 168 hours. No results for input(s): AMMONIA in the last 168 hours. Coagulation Profile: No results for input(s): INR, PROTIME in the last 168 hours. Cardiac Enzymes: No results for input(s): CKTOTAL, CKMB, CKMBINDEX, TROPONINI in the last 168 hours. BNP (last 3 results) No results for input(s): PROBNP in the last 8760 hours. HbA1C: No results for input(s): HGBA1C in the last 72 hours. CBG: Recent Labs  Lab 05/13/2019 1714 05/21/19 0228 05/21/19 0742 05/21/19 1222  GLUCAP 108* 143* 115* 125*   Lipid Profile: No results for input(s): CHOL, HDL, LDLCALC, TRIG, CHOLHDL, LDLDIRECT in the last 72 hours. Thyroid Function Tests: No results for input(s): TSH, T4TOTAL, FREET4, T3FREE, THYROIDAB in the last 72 hours. Anemia Panel: No results for input(s): VITAMINB12, FOLATE,  FERRITIN, TIBC, IRON, RETICCTPCT in the last 72 hours. Sepsis Labs: Recent Labs  Lab 05/18/2019 1812 05/21/19 0404  PROCALCITON 8.63 9.79    Recent Results (from the past 240 hour(s))  SARS Coronavirus 2 [facility] order, Performed in [facility] hospital lab) Nasopharyngeal Nasopharyngeal Swab     Status: None   Collection Time: 04/26/2019 12:46 PM   Specimen: Nasopharyngeal Swab  Result Value Ref Range Status   SARS Coronavirus 2 NEGATIVE NEGATIVE Final    Comment: (NOTE) If result is NEGATIVE SARS-CoV-2 target nucleic acids are NOT DETECTED. The SARS-CoV-2 RNA is generally detectable in upper and lower  respiratory specimens during the acute phase of infection. The lowest  concentration of SARS-CoV-2 viral copies this assay can detect is 250  copies / mL. A negative result does not preclude SARS-CoV-2 infection  and should not be used as the sole basis for treatment or other  patient management decisions.  A negative result may occur with  improper specimen collection / handling, submission of specimen other  than nasopharyngeal swab, presence of viral mutation(s) within the  areas targeted by this assay, and inadequate number of viral copies  (<250 copies / mL). A negative result must be combined with clinical  observations, patient history, and epidemiological information. If result is POSITIVE SARS-CoV-2 target nucleic acids are DETECTED. The SARS-CoV-2 RNA is generally detectable in upper and lower  respiratory specimens dur ing the acute phase of infection.  Positive  results are indicative of active infection with SARS-CoV-2.  Clinical  correlation with patient history and other diagnostic information is  necessary to determine patient infection status.  Positive results do  not rule out bacterial infection or co-infection with other viruses. If result is PRESUMPTIVE POSTIVE SARS-CoV-2 nucleic acids MAY BE PRESENT.   A presumptive positive result  was obtained on the  submitted specimen  and confirmed on repeat testing.  While 2019 novel coronavirus  (SARS-CoV-2) nucleic acids may be present in the submitted sample  additional confirmatory testing may be necessary for epidemiological  and / or clinical management purposes  to differentiate between  SARS-CoV-2 and other Sarbecovirus currently known to infect humans.  If clinically indicated additional testing with an alternate test  methodology [phone]) is advised. The SARS-CoV-2 RNA is generally  detectable in upper and lower respiratory sp ecimens during the acute  phase of infection. The expected result is Negative. Fact Sheet for Patients:  StrictlyIdeas.no Fact Sheet for Healthcare Providers: BankingDealers.co.za This test is not yet approved or cleared by the [place] FDA and has been authorized for detection and/or diagnosis of SARS-CoV-2 by FDA under an Emergency Use Authorization (EUA).  This EUA will remain in effect (meaning this test can be used) for the duration of the COVID-19 declaration under Section 564(b)(1) of the Act, 21 U.S.C. section 360bbb-3(b)(1), unless the authorization is terminated or revoked sooner. Performed at [facility], [place] [address], [place], [place] [address]       Radiology Studies: Ct Head Wo Contrast  Result Date: 05/05/2019 CLINICAL DATA:  [place] from chair at home.  Generalize weakness. EXAM: CT HEAD WITHOUT CONTRAST TECHNIQUE: Contiguous axial images were obtained from the base of the skull through the vertex without intravenous contrast. COMPARISON:  Head CT 05/08/2019 FINDINGS: Brain: Remote MCA territory infarct on the right side with encephalomalacia. There is also age advanced fairly extensive periventricular white matter disease. The ventricles are in the midline in are normal in configuration. No extra-axial fluid collections are identified. No intracranial mass lesions. The brainstem and  cerebellum are grossly normal. Vascular: Stable vascular calcifications. No definite aneurysm or hyperdense vessels. Skull: No skull fracture or bone lesions. Sinuses/Orbits: The paranasal sinuses and mastoid air cells are clear. The globes are intact. Other: No scalp lesions or hematoma. IMPRESSION: 1. Remote MCA territory infarct on the right side with encephalomalacia. 2. Age advanced periventricular white matter disease. 3. No acute intracranial findings or mass lesions. Electronically Signed   By: Marijo Sanes M.D.   On: 05/21/2019 13:08   Dg Chest Port 1 View  Result Date: 05/08/2019 CLINICAL DATA:  Chest pain and weakness for a few days. EXAM: PORTABLE CHEST 1 VIEW COMPARISON:  CT scan 05/08/2019 FINDINGS: The heart is mildly enlarged but stable. Stable tortuosity and calcification of the thoracic aorta. Stable large right hilar and mediastinal mass and associated obstructive pneumonitis in the right upper lobe. The left lung is grossly clear. The bony structures are intact. IMPRESSION: Stable large right hilar and mediastinal mass/adenopathy and right upper lobe obstructive pneumonitis. Electronically Signed   By: Marijo Sanes M.D.   On: 05/15/2019 12:44     Scheduled Meds: . atorvastatin  40 mg Oral q1800  . clonazePAM  0.25 mg Oral QHS  . fenofibrate  160 mg Oral Daily  . furosemide  20 mg Intravenous Daily  . insulin aspart  0-9 Units Subcutaneous TID WC  . metoprolol tartrate  5 mg Intravenous Q6H  . nicotine  21 mg Transdermal Daily  . potassium chloride  40 mEq Oral BID  . sodium chloride flush  3 mL Intravenous Q12H   Continuous Infusions: . heparin 1,600 Units/hr (05/21/19 0735)     LOS: 1 day    Time spent: 35 minutes spent in the coordination of care today.    Leston Schueller  [place], DO [facility] Pager [phone]  If 7PM-7AM, please contact night-coverage [email or web address] Password [facility] 05/21/2019, 4:24 PM   Spoke with pt about his BP. He does not want an NGT  placed for oral meds. He does not want a ggt in the ICU. I have explained the risks of allowing BP to remain elevated. He states he is aware and is still denying these services. Add clonidine patch. Monitor.

## 2019-05-21 NOTE — Telephone Encounter (Signed)
Routing message to RB as an FYI.

## 2019-05-21 NOTE — ED Notes (Signed)
MRI just called to ask if this patient could come to MRI at this time- apparently they were told last night pt was unable to come. Pt is requiring to be sitting at 90 degrees for comfort and requiring 15 L NRB. I just paged the admitting provider to ask about if this MRI is something that needs to be stat or can it wait until patient either improves or decisions about care are made.

## 2019-05-22 DIAGNOSIS — Z7189 Other specified counseling: Secondary | ICD-10-CM

## 2019-05-22 DIAGNOSIS — F05 Delirium due to known physiological condition: Secondary | ICD-10-CM

## 2019-05-22 DIAGNOSIS — Z515 Encounter for palliative care: Secondary | ICD-10-CM

## 2019-05-22 LAB — MRSA PCR SCREENING: MRSA by PCR: NEGATIVE

## 2019-05-22 LAB — RENAL FUNCTION PANEL
Albumin: 3 g/dL — ABNORMAL LOW (ref 3.5–5.0)
Anion gap: 9 (ref 5–15)
BUN: 44 mg/dL — ABNORMAL HIGH (ref 8–23)
CO2: 32 mmol/L (ref 22–32)
Calcium: 8.7 mg/dL — ABNORMAL LOW (ref 8.9–10.3)
Chloride: 106 mmol/L (ref 98–111)
Creatinine, Ser: 1.34 mg/dL — ABNORMAL HIGH (ref 0.61–1.24)
GFR calc Af Amer: >60 mL/min (ref >60)
GFR calc non Af Amer: 54 mL/min — ABNORMAL LOW (ref >60)
Glucose, Bld: 123 mg/dL — ABNORMAL HIGH (ref 70–99)
Phosphorus: 2.2 mg/dL — ABNORMAL LOW (ref 2.5–4.6)
Potassium: 2.9 mmol/L — ABNORMAL LOW (ref 3.5–5.1)
Sodium: 147 mmol/L — ABNORMAL HIGH (ref 135–145)

## 2019-05-22 LAB — HEPARIN LEVEL (UNFRACTIONATED)
Heparin Unfractionated: 0.64 IU/mL (ref 0.30–0.70)
Heparin Unfractionated: 0.58 IU/mL (ref 0.30–0.70)

## 2019-05-22 LAB — CBC
HCT: 40.3 % (ref 39.0–52.0)
Hemoglobin: 13.2 g/dL (ref 13.0–17.0)
MCH: 34.7 pg — ABNORMAL HIGH (ref 26.0–34.0)
MCHC: 32.8 g/dL (ref 30.0–36.0)
MCV: 106.1 fL — ABNORMAL HIGH (ref 80.0–100.0)
Platelets: 50 10*3/uL — ABNORMAL LOW (ref 150–400)
RBC: 3.8 MIL/uL — ABNORMAL LOW (ref 4.22–5.81)
RDW: 15.1 % (ref 11.5–15.5)
WBC: 8.5 10*3/uL (ref 4.0–10.5)
nRBC: 0 % (ref 0.0–0.2)

## 2019-05-22 LAB — PROCALCITONIN: Procalcitonin: 12.29 ng/mL

## 2019-05-22 LAB — LEGIONELLA PNEUMOPHILA SEROGP 1 UR AG: L. pneumophila Serogp 1 Ur Ag: NEGATIVE

## 2019-05-22 LAB — GLUCOSE, CAPILLARY
Glucose-Capillary: 113 mg/dL — ABNORMAL HIGH (ref 70–99)
Glucose-Capillary: 127 mg/dL — ABNORMAL HIGH (ref 70–99)
Glucose-Capillary: 137 mg/dL — ABNORMAL HIGH (ref 70–99)
Glucose-Capillary: 145 mg/dL — ABNORMAL HIGH (ref 70–99)

## 2019-05-22 LAB — MAGNESIUM: Magnesium: 2 mg/dL (ref 1.7–2.4)

## 2019-05-22 MED ORDER — POTASSIUM CHLORIDE 10 MEQ/100ML IV SOLN
10.0000 meq | INTRAVENOUS | Status: AC
  Administered 2019-05-22 (×4): 10 meq via INTRAVENOUS
  Filled 2019-05-22 (×4): qty 100

## 2019-05-22 MED ORDER — SODIUM CHLORIDE 0.9% FLUSH
10.0000 mL | Freq: Two times a day (BID) | INTRAVENOUS | Status: DC
  Administered 2019-05-22 – 2019-05-23 (×3): 10 mL

## 2019-05-22 MED ORDER — SODIUM CHLORIDE 0.9% FLUSH: 10.0000 mL | INTRAVENOUS | Status: DC | PRN

## 2019-05-22 MED ORDER — POTASSIUM PHOSPHATES 15 MMOLE/5ML IV SOLN
20.0000 meq | Freq: Once | INTRAVENOUS | Status: AC
  Administered 2019-05-22: 20 meq via INTRAVENOUS
  Filled 2019-05-22: qty 4.55

## 2019-05-22 MED ORDER — POTASSIUM CHLORIDE 10 MEQ/100ML IV SOLN: 10.0000 meq | INTRAVENOUS | Status: DC

## 2019-05-22 MED ORDER — SODIUM CHLORIDE 0.9 % IV SOLN
2.0000 g | Freq: Three times a day (TID) | INTRAVENOUS | Status: DC
  Administered 2019-05-22 – 2019-05-23 (×3): 2 g via INTRAVENOUS
  Filled 2019-05-22 (×6): qty 2

## 2019-05-22 NOTE — Progress Notes (Signed)
Pharmacy Antibiotic Note  Eric [name] is a 68 y.o. male admitted on 05/03/2019 with pneumonia.  Pharmacy has been consulted for cefepime dosing. SCr 1.34 stable.  Plan: Cefepime 2g IV q8h Monitor clinical progress, c/s, renal function F/u de-escalation plan/LOT   Height: 6\' 1"  (185.4 cm) Weight: 230 lb 6.1 oz (104.5 kg) IBW/kg (Calculated) : 79.9  Temp (24hrs), Avg:97.9 F (36.6 C), Min:97.6 F (36.4 C), Max:98.2 F (36.8 C)  Recent Labs  Lab 05/02/2019 1012 05/21/19 0404 05/22/19 0502  WBC 4.0 4.6 8.5  CREATININE 1.34* 1.29* 1.34*    Estimated Creatinine Clearance: 66 mL/min (A) (by C-G formula based on SCr of 1.34 mg/dL (H)).    Allergies  Allergen Reactions  . Latex Swelling  . Oxycodone Other (See Comments)    Sedation/somnolence   Elicia Lamp, PharmD, BCPS Please check AMION for all [place] contact numbers Clinical Pharmacist 05/22/2019 10:23 AM

## 2019-05-22 NOTE — Progress Notes (Signed)
Marland Kitchen  PROGRESS NOTE    Eric [name]  WGN:[id number] DOB: 01-17-1951 DOA: 04/23/2019 PCP: Biagio Borg, MD   Brief Narrative:   Eric Her Lewisis a 68 y.o.malewith medical history significant [name], Eric [name], Eric [name], Eric [name],Eric [name],Eric [name], Eric [name], Eric [name], Eric morbid obesity; who presents with complaints ofworsening weakness.  He was seen 2weeksago in the emergency department for cough Eric chest pain after having a fall at home. CT scan of the head,chest,Eric abdomen revealed large right perihilar mass with mediastinal invasion,narrowing, Eric encasement of the right pulmonary artery Eric right hilar vessels. Possible thrombus in the brachiocephalic confluence with marked narrowing Eric/or tumor thrombus of the SVC. Suspicious airspace disease also noted.Bilateral adrenal gland masses concerning for metastatic disease. Indeterminate bilateral renal lesions with mild to moderaterighthydronephrosis secondary to obstruction at the proximal right ureterat the level of the UPJ. Pulmonology had been consulted Eric it was recommended start patient on anticoagulation. However,the patient left against medical advice.  Since being home patient reports that he has had multiple falls. He complains of chest pain, worsening shortness of breath, lower extremity swelling, Eric persistent cough. He reports that he has had progressive weight loss over last 6 months Eric shortness of breath over the last 3 months. He had slid out of his chair this morning was unable to get back up. Denies any injuries, but EMS was called thereafter by his wife. Denies having any injuries. In route with EMS patient was given 1 sublingual nitroglycerin Eric his blood pressures were noted to be elevated into the 200s. He does admit to still smoking half pack cigarettes per day on average.  9/30: Family talked with PC. Still wish to have Bx performed 10/1: Still on NRB. Hematuria noted ON.  BP improved, but room for more improvement.   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       - He was initially seen on 9/17 for progressively worsening cough found to have large perihilar mass with metastatic lesions, but had declined patient at that time.     - Palliative care consulted; pt/family wishing to go forward with Bx     - MRI brain ordered, but unable to tolerate lying flat     - Check sputum culture Eric procalcitonin     - appreciate pulm assistance     - BiPap?  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     - Heparin per pharmacy  Hypokalemia Hypophosphatemia     - replace, monitor  Hypertensive urgency:      - Acute. Initial blood pressures elevated at 202/91     - not able to take PO right now (awaiting SLP eval)     - IV metoprolol 5mg  q6h; IV hydralazine 10mg  q8h     - added clonidine patch Eric BP is better; can increase clonidine dose  Renal insufficiency, renal hydronephrosis     - Patient creatinine mildly elevated  at 1.34 with BUN 35.  Elevated BUN to creatinine ratio suggest prerenal cause of symptoms.  Patient given IV fluids with replacement of his electrolytes.     - May warrant urology consultation for stent placement depending on goals of care  Eric [name]:      - Stable.  Patient without acute wheezing     - Albuterol nebs as needed  Congestive heart failure     - Last BNP elevated at  422 on 9/17.  On physical exam patient with [name]+ pitting edema of the bilateral lower extremities JVD present, but question secondary to mass.     - I&Os, daily wt  Depression     - Patient apparently reported contemplating suicide with pulmonology.     - Sitter to bedside     - Spiritual consult     - psyc consult d/t SI; situational depression  Thrombocytopenia     - Acute on chronic.  .       - Patient with multiple areas of bruising noted in upper Eric lower extremities.  CT imaging did note signs of cirrhosis.     - Continue to monitor     - plts up to 50 today  Eric [name]:      - Last hemoglobin A1c noted to be 5.6 on 05/08/2019.     - hold home metformin     - SSI  Dyslipidemia     - Continue fenofibrate Eric pharmacy substitution for simvastatin  Tobacco [name]     - Patient still reports smoking half pack cigarettes per day on average.     - Nicotine patch offered  AAA     - As noted on CT angiogram from 9/17 noted to be 3.5 cm in diameter  Dysphagia?     - pt choking on water at bedside     - SLP consulted; NPO for now  RUL PNA     - cough sounding rough     - has no fever; WBC normal but starting to rise, Eric procal is high     - last CXR showever RUL pneumonitis; also seen choking on water/food earlier in stay thus the concern for aspiration as well -- SLP pending     - think it is reasonable to start abx now Eric watch for improvement     - spoke with pharm; cefepime     - repeat CXR in AM  Eric [name] prophylaxis: Heparin Code Status: DNR   Disposition Plan: TBD  Consultants:   PC  PCCM  Psych   Antimicrobials:  . Cefepime   ROS:  Reports dyspnea, anxiety. Denies CP, palpitation, N, V. Remainder 10-pt ROS is negative for all not previously mentioned.  Subjective: "Can I get this mask off?"  Objective: Vitals:   05/22/19 0500 05/22/19 0616 05/22/19 0700 05/22/19 0800  BP:  (!) 168/90 (!) 166/74   Pulse:   98   Resp:   (!) 24    Temp:    98.1 F (36.7 C)  TempSrc:      SpO2:   97%   Weight: 104.5 kg     Height:        Intake/Output Summary (Last 24 hours) at 05/22/2019 0813 Last data filed at 05/22/2019 0325 Gross per 24 hour  Intake 450 ml  Output -  Net 450 ml   Filed Weights   05/09/2019 0957 05/22/19 0500  Weight: 102.5 kg 104.5 kg    Examination:  General:  68 y.o. male resting in bed in NAD Eyes: PERRL, sclera seem a little injected on left ENMT: Nares patent w/o discharge, orophaynx clear, dentition normal, ears w/o discharge/lesions/ulcers Cardiovascular: RRR, +S1, S2, no m/g/r, equal pulses throughout Respiratory: upper airway transmission with weak clearing of cough, rhonch right upper, minor scattered wheeze, increased WOB GI: BS+, NDNT, no masses noted, no organomegaly noted MSK: No c/c; BLE edema Neuro: A&O x 3, no focal deficits Psyc: Appropriate interaction Eric affect, calm/cooperative   Data Reviewed: I have personally reviewed following labs Eric imaging studies.  CBC: Recent Labs  Lab 05/19/2019 1012 05/21/19 0404 05/22/19 0502  WBC 4.0 4.6 8.5  NEUTROABS 3.6  --   --   HGB 14.1 14.[name] 13.[name]  HCT 43.3 42.6 40.3  MCV 106.4* 104.7* 106.1*  PLT 31* 30* 50*   Basic Metabolic Panel: Recent Labs  Lab 04/24/2019 1012 05/21/19 0404 05/22/19 0502  NA 144 142 147*  K [name].1* [name].8* [name].9*  CL 102 106 106  CO2 31 28 32  GLUCOSE 105* 126* 123*  BUN 35* 34* 44*  CREATININE 1.34* 1.29* 1.34*  CALCIUM 9.[name] 8.6* 8.7*  MG [name].[name]  --  [name].0  PHOS  --   --  [name].[name]*   GFR: Estimated Creatinine Clearance: 66 mL/min (A) (by C-G formula based on SCr of 1.34 mg/dL (H)). Liver Function Tests: Recent Labs  Lab 05/05/2019 1012 05/22/19 0502  AST 24  --   ALT 28  --   ALKPHOS 47  --   BILITOT 1.0  --   PROT 5.5*  --   ALBUMIN 3.5 3.0*   No results for input(s): LIPASE, AMYLASE in the last 168 hours. No results for input(s): AMMONIA in the last 168 hours. Coagulation Profile: No results for input(s):  INR, PROTIME in the last 168 hours. Cardiac Enzymes: No results for input(s): CKTOTAL, CKMB, CKMBINDEX, TROPONINI in the last 168 hours. BNP (last 3 results) No results for input(s): PROBNP in the last 8760 hours. HbA1C: No results for input(s): HGBA1C in the last 72 hours. CBG: Recent Labs  Lab 05/21/19 0742 05/21/19 1222 05/21/19 1655 05/21/19 2132 05/22/19 0804  GLUCAP 115* 125* 124* 115* 113*   Lipid Profile: No results for input(s): CHOL, HDL, LDLCALC, TRIG, CHOLHDL, LDLDIRECT in the last 72 hours. Thyroid Function Tests: No results for input(s): TSH, T4TOTAL, FREET4, T3FREE, THYROIDAB in the last 72 hours. Anemia Panel: No results for input(s): VITAMINB12, FOLATE, FERRITIN, TIBC, IRON, RETICCTPCT in the last 72 hours. Sepsis Labs: Recent Labs  Lab 04/28/2019 1812 05/21/19 0404 05/22/19 0502  PROCALCITON 8.63 9.79 12.29    Recent Results (from the past 240 hour(s))  SARS Coronavirus [name] [facility] order, Performed in [facility] hospital lab) Nasopharyngeal Nasopharyngeal Swab     Status: None   Collection Time: 05/16/2019 12:46 PM   Specimen: Nasopharyngeal Swab  Result Value Ref Range Status   SARS Coronavirus [name] NEGATIVE NEGATIVE Final    Comment: (NOTE) If result is NEGATIVE SARS-CoV-[name] target nucleic acids are NOT DETECTED. The SARS-CoV-[name] RNA is generally detectable in upper Eric lower  respiratory specimens during the acute phase of infection. The lowest  concentration of SARS-CoV-[name] viral copies this assay can detect is 250  copies / mL. A negative result does not preclude SARS-CoV-[name] infection  Eric should not be used as the sole basis for treatment or other  patient management decisions.  A negative result may occur with  improper specimen collection / handling, submission of specimen other  than  nasopharyngeal swab, presence of viral mutation(s) within the  areas targeted by this assay, Eric inadequate number of viral copies  (<250 copies / mL). A negative result  must be combined with clinical  observations, patient history, Eric epidemiological information. If result is POSITIVE SARS-CoV-[name] target nucleic acids are DETECTED. The SARS-CoV-[name] RNA is generally detectable in upper Eric lower  respiratory specimens dur ing the acute phase of infection.  Positive  results are indicative of active infection with SARS-CoV-[name].  Clinical  correlation with patient history Eric other diagnostic information is  necessary to determine patient infection status.  Positive results do  not rule out bacterial infection or co-infection with other viruses. If result is PRESUMPTIVE POSTIVE SARS-CoV-[name] nucleic acids MAY BE PRESENT.   A presumptive positive result was obtained on the submitted specimen  Eric confirmed on repeat testing.  While 2019 novel coronavirus  (SARS-CoV-[name]) nucleic acids may be present in the submitted sample  additional confirmatory testing may be necessary for epidemiological  Eric / or clinical management purposes  to differentiate between  SARS-CoV-[name] Eric other Sarbecovirus currently known to infect humans.  If clinically indicated additional testing with an alternate test  methodology [phone]) is advised. The SARS-CoV-[name] RNA is generally  detectable in upper Eric lower respiratory sp ecimens during the acute  phase of infection. The expected result is Negative. Fact Sheet for Patients:  StrictlyIdeas.no Fact Sheet for Healthcare Providers: BankingDealers.co.za This test is not yet approved or cleared by the [place] FDA Eric has been authorized for detection Eric/or diagnosis of SARS-CoV-[name] by FDA under an Emergency [name] Authorization (EUA).  This EUA will remain in effect (meaning this test can be used) for the duration of the COVID-19 declaration under Section 564(b)(1) of the Act, 21 U.S.C. section 360bbb-3(b)(1), unless the authorization is terminated or revoked sooner. Performed at [facility], [place] [address] [place], [place] [address]       Radiology Studies: Ct Head Wo Contrast  Result Date: 05/06/2019 CLINICAL DATA:  [place] from chair at home.  Generalize weakness. EXAM: CT HEAD WITHOUT CONTRAST TECHNIQUE: Contiguous axial images were obtained from the base of the skull through the vertex without intravenous contrast. 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 There is also age advanced fairly extensive periventricular white matter disease. The ventricles are in the midline in are normal in configuration. No extra-axial fluid collections are identified. No intracranial mass lesions. The brainstem Eric cerebellum are grossly normal. Vascular: Stable vascular calcifications. No definite aneurysm or hyperdense vessels. Skull: No skull fracture or bone lesions. Sinuses/Orbits: The paranasal sinuses Eric mastoid air cells are clear. The globes are intact. Other: No scalp lesions or hematoma. IMPRESSION: 1. 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 [name]. Age advanced periventricular white matter disease. 3. No acute intracranial findings or mass lesions. Electronically Signed   By: Marijo Sanes M.D.   On: 05/01/2019 13:08   Dg Chest Port 1 View  Result Date: 05/10/2019 CLINICAL DATA:  Chest pain Eric weakness for a few days. EXAM: PORTABLE CHEST 1 VIEW COMPARISON:  CT scan 05/08/2019 FINDINGS: The heart is mildly enlarged but stable. Stable tortuosity Eric calcification of the thoracic aorta. Stable large right hilar Eric mediastinal mass Eric associated obstructive pneumonitis in the right upper lobe. The left lung is grossly clear. The bony structures are intact. IMPRESSION: Stable large right hilar Eric mediastinal mass/adenopathy Eric right upper lobe obstructive pneumonitis.  Electronically Signed   By: Marijo Sanes M.D.   On: 05/18/2019 12:44     Scheduled Meds: . atorvastatin  40 mg  Oral q1800  . clonazePAM  0.25 mg Oral QHS  . cloNIDine  0.1 mg Transdermal Weekly  . fenofibrate  160 mg Oral Daily  . hydrALAZINE  10 mg Intravenous Q8H  . insulin aspart  0-9 Units Subcutaneous TID WC  . metoprolol tartrate  5 mg Intravenous Q6H  . nicotine  21 mg Transdermal Daily  . sodium chloride flush  3 mL Intravenous Q12H   Continuous Infusions: . heparin 1,250 Units/hr (05/22/19 0425)  . potassium chloride    . potassium PHOSPHATE IVPB (mEq)       LOS: [name] days    Time spent: 35 minutes spent in the coordination of care today.   Jonnie Finner, DO [facility] Pager [phone]  If 7PM-7AM, please contact night-coverage [email or web address] Password [email or web address] 05/22/2019, 8:13 AM
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
## 2019-05-22 NOTE — Consult Note (Addendum)
Telepsych Consultation   This service was provided via telemedicine using a 2-way, interactive audio and video technology.  Names of all persons participating in this telemedicine service and their role in this encounter. Name: Eric [name]  Role:Psychiatrist  Name: Eric [name]  Role: Patient  Name:  Role: Nurse        Reason for Consult:   "Depression, situational" Referring Physician:  Dr. Cherylann [name] Location of Patient: [facility] Location of Provider: [facility]  Patient Identification: Eric [name] MRN:  [id number] Principal Diagnosis: Acute respiratory failure with hypoxia [facility]) Diagnosis:  Principal Problem:   Acute respiratory failure with hypoxia ([place]) Active Problems:   Bilateral lower extremity edema   AAA (abdominal aortic aneurysm) (HCC)   COPD (chronic obstructive pulmonary disease) ([place])   Thrombocytopenia (HCC)   Hypokalemia   Thrombus   Mass of right lung   Palliative care by specialist   Goals of care, counseling/discussion   Total Time spent with patient: 30 minutes  Subjective:   Eric [name] is a 68 y.o. male patient admitted to medical floor and is now being managed for acute respiratory failure with hypoxia.  HPI: Per chart review, patient was admitted with acute respiratory failure with hypoxia secondary to right perihilar mass with suspicion for primary lung cancer. Patient reported contemplating suicide with pulmonology on 9/29 so psychiatry was consulted. It appears that he has been confused today as documented in the nursing note. He is struggling to breath on NRB.   Upon my exam, pt was barely able to talk due to dyspnea. He spoke a few words which were hard to understand. He shook his head to answer no when asked if he has any any suicidal ideations. His nurse informed that pt has been cooperative with them. He has been taking his medications as prescribed. He has not displayed any behaviors or gestures that would indicate  that he is a danger to self. He has not tried to get out of his bed or leave the room.   Past Psychiatric History: anxiety  Risk to Self:   denied Risk to Others:  none Prior Inpatient Therapy:  not for psychiatric reasons. Prior Outpatient Therapy:   He is followed by Eric [name].  Past Medical History:  Past Medical History:  Diagnosis Date  . ALLERGIC RHINITIS 06/24/2007  . 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 ANXIETY 06/24/2007  . Arthritis    "hands, knees, hips, neck; q part of the body" (01/27/2015)  . CELLULITIS AND ABSCESS OF LEG EXCEPT FOOT 03/28/2010  . Cellulitis of left leg 01/27/2015  . CHF (congestive heart failure) ([place])   . Chronic lower back pain    "I have a steel rod in my back"  . Claudication ([place]) 06/02/2011  . COLONIC POLYPS, HX OF 06/24/2007  . Complication of anesthesia    "they can't put me asleep cause I don't wake up" (01/27/2015)  . CONSTIPATION, CHRONIC, HX OF 06/24/2007  . COPD (chronic obstructive pulmonary disease) ([place])   . Coronary artery disease   . CVA (cerebral vascular accident) [facility]) 1990's   "w/MI"; denies residual on 01/27/2015  . DEPRESSION 06/24/2007  . DIABETES MELLITUS, TYPE II dx'd 2015  . DIVERTICULOSIS, COLON 06/24/2007  . DVT (deep venous thrombosis) ([place])   . Heart murmur   . HYPERLIPIDEMIA 06/24/2007  . HYPERTENSION 06/24/2007  . INSOMNIA-SLEEP DISORDER-UNSPEC 01/24/2010  . Migraine    "comes over my right eye q now and then" (01/27/2015)  . Morbid obesity (  [place]) 12/21/2015  . Myocardial infarction ([place]) 1990's X 1   "w/stroke"  . OSA (obstructive sleep apnea)   . SKIN LESION 01/24/2010  . Unspecified Peripheral Vascular Disease 06/24/2007    Past Surgical History:  Procedure Laterality Date  . ANTERIOR CERVICAL DECOMP/DISCECTOMY FUSION  1985   "job related injury"  . BACK SURGERY    . KNEE ARTHROSCOPY Bilateral 1990  . LIGAMENT REPAIR Right 1999   "wrist; job related injury"  . LUMBAR LAMINECTOMY  1990's   Family History:  Family History   Problem Relation Age of Onset  . COPD Father   . Heart disease Father   . Cancer Mother        lung cancer  . Heart disease Maternal Grandmother   . Heart disease Maternal Grandfather   . Heart disease Brother   . Hypertension Brother   . Heart attack Brother   . Peripheral vascular disease Brother    Family Psychiatric  History: unknown Social History:  Social History   Substance and Sexual Activity  Alcohol Use Yes  . Alcohol/week: 0.0 standard drinks   Comment: 01/27/2015 "might have a couple beers/yr"     Social History   Substance and Sexual Activity  Drug Use No    Social History   Socioeconomic History  . Marital status: Married    Spouse name: Not on file  . Number of children: 3  . Years of education: Not on file  . Highest education level: Not on file  Occupational History  . 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 Financial resource strain: Not on file  . Food insecurity    Worry: Not on file    Inability: Not on file  . Transportation needs    Medical: Not on file    Non-medical: Not on file  Tobacco Use  . Smoking status: Current Every Day Smoker    Years: 48.00    Types: Cigars  . Smokeless tobacco: Never Used  . Tobacco comment: 01/27/2015 pt states he smokes 5 cigar cigarettes daily  Substance and Sexual Activity  . Alcohol use: Yes    Alcohol/week: 0.0 standard drinks    Comment: 01/27/2015 "might have a couple beers/yr"  . Drug use: No  . Sexual activity: Never  Lifestyle  . Physical activity    Days per week: Not on file    Minutes per session: Not on file  . Stress: Not on file  Relationships  . Social Herbalist on phone: Not on file    Gets together: Not on file    Attends religious service: Not on file    Active member of club or organization: Not on file    Attends meetings of clubs or organizations: Not on file    Relationship status: Not on file  Other Topics  Concern  . Not on file  Social History Narrative  . Not on file   Additional Social History:    Allergies:   Allergies  Allergen Reactions  . Latex Swelling  . Oxycodone Other (See Comments)    Sedation/somnolence    Labs:  Results for orders placed or performed during the hospital encounter of 05/08/2019 (from the past 48 hour(s))  CBG monitoring, ED     Status: Abnormal   Collection Time: 05/09/2019  5:14 PM  Result Value Ref Range   Glucose-Capillary 108 (H) 70 - 99 mg/dL  HIV Antibody (routine  testing w rflx)     Status: None   Collection Time: 04/24/2019  6:12 PM  Result Value Ref Range   HIV Screen 4th Generation wRfx NON REACTIVE NON REACTIVE    Comment: Performed at [facility], [address], [place], [place] [address]  Procalcitonin - Baseline     Status: None   Collection Time: 05/03/2019  6:12 PM  Result Value Ref Range   Procalcitonin 8.63 ng/mL    Comment:        Interpretation: PCT > 2 ng/mL: Systemic infection (sepsis) is likely, unless other causes are known. (NOTE)       Sepsis PCT Algorithm           Lower Respiratory Tract                                      Infection PCT Algorithm    ----------------------------     ----------------------------         PCT < 0.25 ng/mL                PCT < 0.10 ng/mL         Strongly encourage             Strongly discourage   discontinuation of antibiotics    initiation of antibiotics    ----------------------------     -----------------------------       PCT 0.25 - 0.50 ng/mL            PCT 0.10 - 0.25 ng/mL               OR       >80% decrease in PCT            Discourage initiation of                                            antibiotics      Encourage discontinuation           of antibiotics    ----------------------------     -----------------------------         PCT >= 0.50 ng/mL              PCT 0.26 - 0.50 ng/mL               AND       <80% decrease in PCT              Encourage initiation of                                              antibiotics       Encourage continuation           of antibiotics    ----------------------------     -----------------------------        PCT >= 0.50 ng/mL                  PCT > 0.50 ng/mL               AND         increase in PCT  Strongly encourage                                      initiation of antibiotics    Strongly encourage escalation           of antibiotics                                     -----------------------------                                           PCT <= 0.25 ng/mL                                                 OR                                        > 80% decrease in PCT                                     Discontinue / Do not initiate                                             antibiotics Performed at [facility], [place] [address] [place], [place] [address]   Brain natriuretic peptide     Status: Abnormal   Collection Time: 05/02/2019  6:12 PM  Result Value Ref Range   B Natriuretic Peptide 269.3 (H) 0.0 - 100.0 pg/mL    Comment: Performed at [facility] [address] [place], [place] [address]  Heparin level (unfractionated)     Status: None   Collection Time: 04/28/2019  7:30 PM  Result Value Ref Range   Heparin Unfractionated 0.58 0.30 - 0.70 IU/mL    Comment: (NOTE) If heparin results are below expected values, and patient dosage has  been confirmed, suggest follow up testing of antithrombin III levels. Performed at [facility], [place] [address] [place], [place] [address]   CBG monitoring, ED     Status: Abnormal   Collection Time: 05/21/19  2:28 AM  Result Value Ref Range   Glucose-Capillary 143 (H) 70 - 99 mg/dL  CBC     Status: Abnormal   Collection Time: 05/21/19  4:04 AM  Result Value Ref Range   WBC 4.6 4.0 - 10.5 K/uL   RBC 4.07 (L) 4.22 - 5.81 MIL/uL   Hemoglobin 14.2 13.0 - 17.0 g/dL   HCT 42.6 39.0 - 52.0 %   MCV 104.7 (H) 80.0 - 100.0 fL   MCH 34.9  (H) 26.0 - 34.0 pg   MCHC 33.3 30.0 - 36.0 g/dL   RDW 14.6 11.5 - 15.5 %   Platelets 30 (L) 150 - 400 K/uL    Comment: REPEATED TO VERIFY Immature Platelet Fraction  may be clinically indicated, consider ordering this additional test [id number] CONSISTENT WITH PREVIOUS RESULT    nRBC 0.0 0.0 - 0.2 %    Comment: Performed at [facility], [place] [address] [place], [place] [address]  Basic metabolic panel     Status: Abnormal   Collection Time: 05/21/19  4:04 AM  Result Value Ref Range   Sodium 142 135 - 145 mmol/L   Potassium 2.8 (L) 3.5 - 5.1 mmol/L    Comment: DELTA CHECK NOTED   Chloride 106 98 - 111 mmol/L   CO2 28 22 - 32 mmol/L   Glucose, Bld 126 (H) 70 - 99 mg/dL   BUN 34 (H) 8 - 23 mg/dL   Creatinine, Ser 1.29 (H) 0.61 - 1.24 mg/dL   Calcium 8.6 (L) 8.9 - 10.3 mg/dL   GFR calc non Af Amer 56 (L) >60 mL/min   GFR calc Af Amer >60 >60 mL/min   Anion gap 8 5 - 15    Comment: Performed at [facility] [address] [place], [place] [address]  Procalcitonin     Status: None   Collection Time: 05/21/19  4:04 AM  Result Value Ref Range   Procalcitonin 9.79 ng/mL    Comment:        Interpretation: PCT > 2 ng/mL: Systemic infection (sepsis) is likely, unless other causes are known. (NOTE)       Sepsis PCT Algorithm           Lower Respiratory Tract                                      Infection PCT Algorithm    ----------------------------     ----------------------------         PCT < 0.25 ng/mL                PCT < 0.10 ng/mL         Strongly encourage             Strongly discourage   discontinuation of antibiotics    initiation of antibiotics    ----------------------------     -----------------------------       PCT 0.25 - 0.50 ng/mL            PCT 0.10 - 0.25 ng/mL               OR       >80% decrease in PCT            Discourage initiation of                                            antibiotics      Encourage discontinuation           of  antibiotics    ----------------------------     -----------------------------         PCT >= 0.50 ng/mL              PCT 0.26 - 0.50 ng/mL               AND       <80% decrease in PCT              Encourage initiation of  antibiotics       Encourage continuation           of antibiotics    ----------------------------     -----------------------------        PCT >= 0.50 ng/mL                  PCT > 0.50 ng/mL               AND         increase in PCT                  Strongly encourage                                      initiation of antibiotics    Strongly encourage escalation           of antibiotics                                     -----------------------------                                           PCT <= 0.25 ng/mL                                                 OR                                        > 80% decrease in PCT                                     Discontinue / Do not initiate                                             antibiotics Performed at [facility], [address] [place], [place] [address]   Heparin level (unfractionated)     Status: Abnormal   Collection Time: 05/21/19  4:04 AM  Result Value Ref Range   Heparin Unfractionated 0.88 (H) 0.30 - 0.70 IU/mL    Comment: (NOTE) If heparin results are below expected values, and patient dosage has  been confirmed, suggest follow up testing of antithrombin III levels. Performed at [facility], [place] [address] [place], [place] [address]   CBG monitoring, ED     Status: Abnormal   Collection Time: 05/21/19  7:42 AM  Result Value Ref Range   Glucose-Capillary 115 (H) 70 - 99 mg/dL  CBG monitoring, ED     Status: Abnormal   Collection Time: 05/21/19 12:22 PM  Result Value Ref Range   Glucose-Capillary 125 (H) 70 - 99 mg/dL  Heparin level (unfractionated)     Status: Abnormal   Collection Time: 05/21/19  4:02 PM  Result Value Ref  Range    Heparin Unfractionated 0.89 (H) 0.30 - 0.70 IU/mL    Comment: (NOTE) If heparin results are below expected values, and patient dosage has  been confirmed, suggest follow up testing of antithrombin III levels. Performed at [facility], [place] [address], [place], [place] [address]   Glucose, capillary     Status: Abnormal   Collection Time: 05/21/19  4:55 PM  Result Value Ref Range   Glucose-Capillary 124 (H) 70 - 99 mg/dL  Glucose, capillary     Status: Abnormal   Collection Time: 05/21/19  9:32 PM  Result Value Ref Range   Glucose-Capillary 115 (H) 70 - 99 mg/dL  Heparin level (unfractionated)     Status: None   Collection Time: 05/22/19 12:17 AM  Result Value Ref Range   Heparin Unfractionated 0.64 0.30 - 0.70 IU/mL    Comment: (NOTE) If heparin results are below expected values, and patient dosage has  been confirmed, suggest follow up testing of antithrombin III levels. Performed at [facility], [place] [address] [place], [place] [address]   CBC     Status: Abnormal   Collection Time: 05/22/19  5:02 AM  Result Value Ref Range   WBC 8.5 4.0 - 10.5 K/uL   RBC 3.80 (L) 4.22 - 5.81 MIL/uL   Hemoglobin 13.2 13.0 - 17.0 g/dL   HCT 40.3 39.0 - 52.0 %   MCV 106.1 (H) 80.0 - 100.0 fL   MCH 34.7 (H) 26.0 - 34.0 pg   MCHC 32.8 30.0 - 36.0 g/dL   RDW 15.1 11.5 - 15.5 %   Platelets 50 (L) 150 - 400 K/uL    Comment: REPEATED TO VERIFY Immature Platelet Fraction may be clinically indicated, consider ordering this additional test [id number] CONSISTENT WITH PREVIOUS RESULT    nRBC 0.0 0.0 - 0.2 %    Comment: Performed at [facility], [place] [address], [place], [place] [address]  Procalcitonin     Status: None   Collection Time: 05/22/19  5:02 AM  Result Value Ref Range   Procalcitonin 12.29 ng/mL    Comment:        Interpretation: PCT >= 10 ng/mL: Important systemic inflammatory response, almost exclusively due to severe bacterial sepsis or septic  shock. (NOTE)       Sepsis PCT Algorithm           Lower Respiratory Tract                                      Infection PCT Algorithm    ----------------------------     ----------------------------         PCT < 0.25 ng/mL                PCT < 0.10 ng/mL         Strongly encourage             Strongly discourage   discontinuation of antibiotics    initiation of antibiotics    ----------------------------     -----------------------------       PCT 0.25 - 0.50 ng/mL            PCT 0.10 - 0.25 ng/mL               OR       >80% decrease in PCT  Discourage initiation of                                            antibiotics      Encourage discontinuation           of antibiotics    ----------------------------     -----------------------------         PCT >= 0.50 ng/mL              PCT 0.26 - 0.50 ng/mL                AND       <80% decrease in PCT             Encourage initiation of                                             antibiotics       Encourage continuation           of antibiotics    ----------------------------     -----------------------------        PCT >= 0.50 ng/mL                  PCT > 0.50 ng/mL               AND         increase in PCT                  Strongly encourage                                      initiation of antibiotics    Strongly encourage escalation           of antibiotics                                     -----------------------------                                           PCT <= 0.25 ng/mL                                                 OR                                        > 80% decrease in PCT                                     Discontinue / Do not initiate  antibiotics Performed at [facility], [place] [address], [place], [place] [address]   Magnesium     Status: None   Collection Time: 05/22/19  5:02 AM  Result Value Ref Range   Magnesium 2.0 1.7 - 2.4 mg/dL     Comment: Performed at [facility] [address], [place], [place] [address]  Renal function panel     Status: Abnormal   Collection Time: 05/22/19  5:02 AM  Result Value Ref Range   Sodium 147 (H) 135 - 145 mmol/L   Potassium 2.9 (L) 3.5 - 5.1 mmol/L   Chloride 106 98 - 111 mmol/L   CO2 32 22 - 32 mmol/L   Glucose, Bld 123 (H) 70 - 99 mg/dL   BUN 44 (H) 8 - 23 mg/dL   Creatinine, Ser 1.34 (H) 0.61 - 1.24 mg/dL   Calcium 8.7 (L) 8.9 - 10.3 mg/dL   Phosphorus 2.2 (L) 2.5 - 4.6 mg/dL   Albumin 3.0 (L) 3.5 - 5.0 g/dL   GFR calc non Af Amer 54 (L) >60 mL/min   GFR calc Af Amer >60 >60 mL/min   Anion gap 9 5 - 15    Comment: Performed at [facility] [address] [place], [place] [address]  Glucose, capillary     Status: Abnormal   Collection Time: 05/22/19  8:04 AM  Result Value Ref Range   Glucose-Capillary 113 (H) 70 - 99 mg/dL  MRSA PCR Screening     Status: None   Collection Time: 05/22/19  9:45 AM   Specimen: Nasal Mucosa; Nasopharyngeal  Result Value Ref Range   MRSA by PCR NEGATIVE NEGATIVE    Comment:        The GeneXpert MRSA Assay (FDA approved for NASAL specimens only), is one component of a comprehensive MRSA colonization surveillance program. It is not intended to diagnose MRSA infection nor to guide or monitor treatment for MRSA infections. Performed at [facility], [place] [address] [place], [place] [address]   Heparin level (unfractionated)     Status: None   Collection Time: 05/22/19  9:49 AM  Result Value Ref Range   Heparin Unfractionated 0.58 0.30 - 0.70 IU/mL    Comment: (NOTE) If heparin results are below expected values, and patient dosage has  been confirmed, suggest follow up testing of antithrombin III levels. Performed at [facility], [place] [address], [place], [place] [address]   Glucose, capillary     Status: Abnormal   Collection Time: 05/22/19 11:38 AM  Result Value Ref Range   Glucose-Capillary 127 (H) 70 - 99  mg/dL    Medications:  Current Facility-Administered Medications  Medication Dose Route Frequency Provider Last Rate Last Dose  . acetaminophen (TYLENOL) tablet 650 mg  650 mg Oral Q6H PRN Norval Morton, MD       Or  . acetaminophen (TYLENOL) suppository 650 mg  650 mg Rectal Q6H PRN Smith, Rondell A, MD      . albuterol (PROVENTIL) (2.5 MG/3ML) 0.083% nebulizer solution 2.5 mg  2.5 mg Nebulization Q6H PRN Tamala Julian, Rondell A, MD      . atorvastatin (LIPITOR) tablet 40 mg  40 mg Oral q1800 Smith, Rondell A, MD      . ceFEPIme (MAXIPIME) 2 g in sodium chloride 0.9 % 100 mL IVPB  2 g Intravenous Q8H Romona Curls, RPH 200 mL/hr at 05/22/19 1212 2 g at 05/22/19 1212  . clonazePAM (KLONOPIN) tablet 0.25  mg  0.25 mg Oral QHS Smith, Rondell A, MD   0.25 mg at 05/21/19 0050  . cloNIDine (CATAPRES - Dosed in mg/24 hr) patch 0.1 mg  0.1 mg Transdermal Weekly Kyle, Tyrone A, DO   0.1 mg at 05/21/19 1738  . fenofibrate tablet 160 mg  160 mg Oral Daily Smith, Rondell A, MD      . heparin ADULT infusion 100 units/mL (25000 units/267mL sodium chloride 0.45%)  1,250 Units/hr Intravenous Continuous Kyle, Tyrone A, DO 12.5 mL/hr at 05/22/19 0425 1,250 Units/hr at 05/22/19 0425  . hydrALAZINE (APRESOLINE) injection 10 mg  10 mg Intravenous Q8H Kyle, Tyrone A, DO   10 mg at 05/22/19 0616  . insulin aspart (novoLOG) injection 0-9 Units  0-9 Units Subcutaneous TID WC Fuller Plan A, MD   1 Units at 05/22/19 1231  . metoprolol tartrate (LOPRESSOR) injection 5 mg  5 mg Intravenous Q6H Kyle, Tyrone A, DO   5 mg at 05/22/19 1227  . morphine 2 MG/ML injection 2 mg  2 mg Intravenous Q2H PRN Fuller Plan A, MD   2 mg at 05/22/19 1223  . nicotine (NICODERM CQ - dosed in mg/24 hours) patch 21 mg  21 mg Transdermal Daily Norval Morton, MD   Stopped at 05/21/19 1043  . polyvinyl alcohol (LIQUIFILM TEARS) 1.4 % ophthalmic solution 1 drop  1 drop Both Eyes PRN Marylyn Ishihara, Tyrone A, DO   1 drop at 05/21/19 2248  . potassium  PHOSPHATE 20 mEq in dextrose 5 % 250 mL infusion  20 mEq Intravenous Once Eric [name] A, DO 42 mL/hr at 05/22/19 1013 20 mEq at 05/22/19 1013  . sodium chloride flush (NS) 0.9 % injection 10-40 mL  10-40 mL Intracatheter Q12H Kyle, Tyrone A, DO   10 mL at 05/22/19 1229  . sodium chloride flush (NS) 0.9 % injection 10-40 mL  10-40 mL Intracatheter PRN Marylyn Ishihara, Tyrone A, DO      . sodium chloride flush (NS) 0.9 % injection 3 mL  3 mL Intravenous Q12H Smith, Rondell A, MD   3 mL at 05/22/19 0747    Musculoskeletal: Strength & Muscle Tone: decreased Gait & Station: unable to stand Patient leans: N/A  Psychiatric Specialty Exam: Physical Exam  Constitutional: He appears well-nourished.  HENT:  Head: Normocephalic and atraumatic.  Respiratory: He is in respiratory distress.  Neurological: He is alert.    Review of Systems  Constitutional: Positive for malaise/fatigue.  Respiratory: Positive for shortness of breath.   Neurological: Positive for weakness.    Blood pressure (!) 190/85, pulse 99, temperature 98 F (36.7 C), resp. rate (!) 28, height 6\' 1"  (1.854 m), weight 104.5 kg, SpO2 95 %.Body mass index is 30.4 kg/m.  General Appearance: In mild distress due to dyspnea  Eye Contact:  Fair  Speech:  Garbled  Volume:  Decreased  Mood:  Euthymic  Affect:  Congruent  Thought Process:  Linear and Descriptions of Associations: Intact  Orientation:  Other:  unable to assess as pt is hard to understand due to current dyspneic state  Thought Content:  Logical  Suicidal Thoughts:  No  Homicidal Thoughts:  No  Memory:  unable to accurately assess due to ongoing breathing difficulty  Judgement:  Impaired  Insight:  impaired  Psychomotor Activity:  Decreased  Concentration:  Concentration: Poor  Recall:  unable to accurately assess due to ongoing breathing difficulty  Fund of Knowledge:  unable to accurately assess due to ongoing breathing difficulty  Language:  Fair  Akathisia:  No   Handed:  Right  AIMS (if indicated):     Assets:  Social Support  ADL's:  Impaired  Cognition:  WNL  Sleep:   poor due to dyspnea     Treatment Plan Summary: Assessment and Plan: This is a 68 y/o male with hx of multiple medical co-morbidities now being managed for acute hypoxic respiratory failure. He has been placed on full comfort care and is followed by the palliative team. Psychiatry was consulted for evaluation for depression. Pt is currently medically unstable and needs stabilization. -Recommend continued medical management. -May continue 1:1 sitter for fall precautions.  Disposition: No evidence of imminent risk to self or others at present.     Psychiatry signing off, re-consult as needed.  Eric Crane, MD 05/22/2019 2:45 PM

## 2019-05-22 NOTE — Progress Notes (Signed)
Daily Progress Note   Patient Name: Eric [name]       Date: 05/22/2019 DOB: 27-Jan-1951  Age: 68 y.o. MRN#: [id number] Attending Physician: Jonnie Finner, DO Primary Care Physician: Biagio Borg, MD Admit Date: 05/17/2019  Reason for Consultation/Follow-up: Establishing goals of care  Subjective: Awake, still on nonrebreather mask, appears with mild fatigue, generalized deconditioning.  Appears with mild dyspnea  Length of Stay: 2  Current Medications: Scheduled Meds:  . atorvastatin  40 mg Oral q1800  . clonazePAM  0.25 mg Oral QHS  . cloNIDine  0.1 mg Transdermal Weekly  . fenofibrate  160 mg Oral Daily  . hydrALAZINE  10 mg Intravenous Q8H  . insulin aspart  0-9 Units Subcutaneous TID WC  . metoprolol tartrate  5 mg Intravenous Q6H  . nicotine  21 mg Transdermal Daily  . sodium chloride flush  10-40 mL Intracatheter Q12H  . sodium chloride flush  3 mL Intravenous Q12H    Continuous Infusions: . ceFEPime (MAXIPIME) IV 2 g (05/22/19 1212)  . heparin 1,250 Units/hr (05/22/19 0425)  . potassium chloride 10 mEq (05/22/19 1226)  . potassium PHOSPHATE IVPB (mEq) 20 mEq (05/22/19 1013)    PRN Meds: acetaminophen **OR** acetaminophen, albuterol, morphine injection, polyvinyl alcohol, sodium chloride flush  Physical Exam         68 year old gentleman sitting up in bed Left eye is red Diminished breath sounds increased work of breathing on nonrebreather mask S1-S2 distant Has bilateral edema Awake, reasonably alert  Vital Signs: BP (!) 149/74   Pulse (!) 107   Temp 98.1 F (36.7 C)   Resp (!) 22   Ht 6' 1"  (1.854 m)   Wt 104.5 kg   SpO2 96%   BMI 30.40 kg/m  SpO2: SpO2: 96 % O2 Device: O2 Device: NRB O2 Flow Rate: O2 Flow Rate (L/min): 15 L/min  Intake/output  summary:   Intake/Output Summary (Last 24 hours) at 05/22/2019 1312 Last data filed at 05/22/2019 1229 Gross per 24 hour  Intake 681.71 ml  Output 700 ml  Net -18.29 ml   LBM:   Baseline Weight: Weight: 102.5 kg Most recent weight: Weight: 104.5 kg       Palliative Assessment/Data:      Patient Active Problem List   Diagnosis Date Noted  . Acute respiratory  failure with hypoxia ([place]) 04/25/2019  . Thrombus 05/12/2019  . Mass of right lung 05/12/2019  . Mediastinal mass 05/08/2019  . Degenerative arthritis of knee, bilateral 01/24/2019  . Recurrent falls 01/24/2019  . Gait disorder 01/05/2018  . Leg wound, left 01/04/2018  . Open leg wound 01/04/2018  . Cellulitis of right leg 08/31/2017  . Dyspnea 08/31/2017  . Peripheral edema 08/31/2017  . Morbid obesity ([place]) 12/21/2015  . Lower back pain 06/22/2015  . Cough 06/22/2015  . COPD exacerbation ([place]) 06/22/2015  . Blurred vision, left eye 06/22/2015  . Encounter for well adult exam with abnormal findings 02/10/2015  . Anxiety and depression 01/27/2015  . COPD (chronic obstructive pulmonary disease) ([place]) 01/27/2015  . Dyslipidemia 01/27/2015  . 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[place]) 35/57/3220  . Thrombocytopenia ([place]) 01/27/2015  . Hypokalemia 01/27/2015  . AAA (abdominal aortic aneurysm) ([place]) 08/28/2014  . Lumbar radiculopathy 07/20/2014  . Bilateral lower extremity edema 06/29/2014  . Bilateral knee pain 05/31/2014  . Claudication ([place]) 06/02/2011  . Obstructive sleep apnea 06/24/2007  . Essential hypertension 06/24/2007  . 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  Patient remains on nonrebreather mask asking for it to be removed.  Patient just given morphine IV as needed.  Patient's wife also present at the bedside.  She states she has discussed with hospital medicine as well as with pulmonary staff.  She is thankful for information provided.  She states that the patient is declining rapidly and is agreeable with the decision that any diagnostic procedures or attempts at biopsy would cause more harm and discomfort than benefit, would likely not change outcome.  Overall, goals are shifting towards establishing comfort as a singular goal.  To this end, we discussed about appropriate pain and non-pain symptom management, judicious use of opioids and benzodiazepines.  Additionally, hospice philosophy of care, specifically differences between home with hospice and residential hospice discussed in detail and explained to wife.  She lives with her husband the patient and their son who is only there at night.  She states she will not be able to physically lift him or help him at home.  We talked about residential hospice.  She is familiar with [facility] and does not live too far away from [facility].  Choices discussed.  Will request social worker assistance for facilitating transfer to residential hospice.  Also briefly discussed with [facility] MD Dr. Marylyn Ishihara.  Goals of Care and Additional Recommendations:  Limitations on Scope of Treatment: Full Comfort Care  Code Status:    Code Status Orders  (From admission, onward)         Start     Ordered   04/29/2019 1641  Do not attempt resuscitation (DNR)  Continuous    Question Answer Comment  In the event of cardiac or respiratory ARREST Do not call a "code blue"   In the event of cardiac or respiratory ARREST Do not perform Intubation, CPR, defibrillation or ACLS   In the event of cardiac or respiratory ARREST Use medication by  any route, position, wound care, and other measures to relive pain and suffering. May use oxygen, suction  and manual treatment of airway obstruction as needed for comfort.      05/09/2019 1642        Code Status History    Date Active Date Inactive Code Status Order ID Comments User Context   05/08/2019 1848 05/09/2019 0055 DNR [id number]  Kayleen Memos, DO ED   01/27/2015 1637 02/03/2015 1946 Full Code [id number]  Robbie Lis, MD ED   Advance Care Planning Activity       Prognosis:   < 2 weeks  Discharge Planning:  Hospice facility  Care plan was discussed with  Patient and wife.   Thank you for allowing the Palliative Medicine Team to assist in the care of this patient.   Time In:  12 Time Out: 12.35 Total Time 35 Prolonged Time Billed  no       Greater than 50%  of this time was spent counseling and coordinating care related to the above assessment and plan.  Loistine Chance, MD [phone] Please contact Palliative Medicine Team phone at [phone] for questions and concerns.

## 2019-05-22 NOTE — Progress Notes (Signed)
ANTICOAGULATION CONSULT NOTE - Follow-up Consult  Pharmacy Consult for Heparin Indication: SVC thrombus  Allergies  Allergen Reactions  . Latex Swelling  . Oxycodone Other (See Comments)    Sedation/somnolence    Patient Measurements: Height: 6\' 1"  (185.4 cm) Weight: 230 lb 6.1 oz (104.5 kg) IBW/kg (Calculated) : 79.9 Heparin Dosing Weight: 100.7 kg  Vital Signs: Temp: 98.1 F (36.7 C) (10/01 0800) Temp Source: Oral (10/01 0345) BP: 149/74 (10/01 1200) Pulse Rate: 107 (10/01 1200)  Labs: Recent Labs    04/27/2019 1012  05/21/19 0404 05/21/19 1602 05/22/19 0017 05/22/19 0502 05/22/19 0949  HGB 14.1  --  14.2  --   --  13.2  --   HCT 43.3  --  42.6  --   --  40.3  --   PLT 31*  --  30*  --   --  50*  --   HEPARINUNFRC  --    < > 0.88* 0.89* 0.64  --  0.58  CREATININE 1.34*  --  1.29*  --   --  1.34*  --    < > = values in this interval not displayed.    Estimated Creatinine Clearance: 66 mL/min (A) (by C-G formula based on SCr of 1.34 mg/dL (H)).  Assessment: 68 y.o. male presenting with weakness. He continues on IV heparin for possible thrombus at the brachiocephalic confluence or tumor thrombus of the SVC.  Heparin level remains therapeutic at 0.58 - rate was slightly reduced this AM to 1250 units/hr due to RN reporting hematuria. Per discussion with RN this afternoon, hematuria is not worsening. MD aware of hematuria. CBC stable on last check - plt low but stable.   Goal of Therapy:   Heparin level 0.3-0.7 units/ml Monitor platelets by anticoagulation protocol: Yes   Plan:  Continue heparin infusion at 1250 units/hr Monitor daily heparin level, CBC, and for increased s/sx of bleeding  Elicia Lamp, PharmD, BCPS Please check AMION for all [place] contact numbers Clinical Pharmacist 05/22/2019 12:42 PM

## 2019-05-22 NOTE — Progress Notes (Signed)
NAME:  Eric [name], MRN:  [id number], DOB:  04-28-1951, LOS: 2 ADMISSION DATE:  05/10/2019, CONSULTATION DATE: 05/06/2019 REFERRING MD:  Trude Mcburney, PA, CHIEF COMPLAINT: Right hilar mass  Brief History   68 year old smoker with newly identified right hilar mass (9/17), possible associated thrombus at the brachiocephalic confluence and in the SVC.  History of present illness   68 year old smoker with a right hilar mass identified on CT scan of the chest 9/17, possible associated thrombus at the brachiocephalic confluence, possible clot or tumor in the SVC.  He was in the emergency department 9/17 for dyspnea, chest discomfort and falls.  He left AMA even after the details of his CT chest were described to him.  He returns now with progressive weakness, falling.  PCCM consulted to assist with evaluation of his CT abnormalities, presumed lung cancer  Past Medical History   has a past medical history of ALLERGIC RHINITIS (06/24/2007), Anal fistula (06/24/2007), ANXIETY (06/24/2007), Arthritis, CELLULITIS AND ABSCESS OF LEG EXCEPT FOOT (03/28/2010), Cellulitis of left leg (01/27/2015), CHF (congestive heart failure) ([place]), Chronic lower back pain, Claudication ([place]) (06/02/2011), COLONIC POLYPS, HX OF (82/04/9370), Complication of anesthesia, CONSTIPATION, CHRONIC, HX OF (06/24/2007), COPD (chronic obstructive pulmonary disease) ([place]), Coronary artery disease, CVA (cerebral vascular accident) ([place]) (1990's), DEPRESSION (06/24/2007), DIABETES MELLITUS, TYPE II (dx'd 2015), DIVERTICULOSIS, COLON (06/24/2007), DVT (deep venous thrombosis) ([place]), Heart murmur, HYPERLIPIDEMIA (06/24/2007), HYPERTENSION (06/24/2007), INSOMNIA-SLEEP DISORDER-UNSPEC (01/24/2010), Migraine, Morbid obesity ([place]) (12/21/2015), Myocardial infarction ([place]) (1990's X 1), OSA (obstructive sleep apnea), SKIN LESION (01/24/2010), and Unspecified Peripheral Vascular Disease (06/24/2007).   Significant Hospital Events     Consults:  PCCM  Procedures:     Significant Diagnostic Tests:  CT chest 9/17 >> large right hilar mass, mediastinal invasion and bulky mediastinal lymphadenopathy, probable right mainstem endobronchial lesion.  Encasement of the right pulmonary artery and hilar vessels, question thrombus at the brachiocephalic confluence with narrowing and/or tumor with thrombus at the SVC.  Probable metastatic disease in the right upper lobe.  Bilateral low axillary lymphadenopathy, likely metastatic CT abdomen 9/17 >> bilateral adrenal masses, likely metastatic numerous perinephric soft tissue masses of multiple suspicious retroperitoneal peritoneal metastatic lesions.  2.8 cm probable metastatic mass in the left gluteal musculature  Micro Data:    Antimicrobials:     Interim history/subjective:  He has not felt very much about goals for care, whether he would or would more biopsy.  His overall goal is to get home.  He understands that he has limited time left  Objective   Blood pressure (!) 149/74, pulse (!) 107, temperature 98.1 F (36.7 C), resp. rate (!) 22, height 6\' 1"  (1.854 m), weight 104.5 kg, SpO2 96 %.    FiO2 (%):  [15 %] 15 %   Intake/Output Summary (Last 24 hours) at 05/22/2019 1330 Last data filed at 05/22/2019 1229 Gross per 24 hour  Intake 681.71 ml  Output 700 ml  Net -18.29 ml   Filed Weights   04/29/2019 0957 05/22/19 0500  Weight: 102.5 kg 104.5 kg    Examination: General: Ill-appearing obese man, more comfortable now on 1.00 facemask HENT: Large neck, cervical lymphadenopathy, hoarse voice, managing secretions adequately, pupils equal, scleral hemorrhages present without icterus Lungs: Clear on the left, very little air movement on the right, some coarse bronchial breath sounds Cardiovascular: Borderline tachycardic, regular, no murmur, distant Abdomen: Obese, soft, diffusely mildly tender but no rebound or guarding.  Positive bowel sounds Extremities: 2-3+ bilateral pretibial edema Neuro: Awake, alert,  interacts and  follows commands, moves all extremities Skin: Widespread telangiectasias on his shoulders, face, chest  Resolved Hospital Problem list     Assessment & Plan:  Right hilar mass with probable primary lung cancer and associated local and distant metastatic disease.   -I discussed the patient's current clinical status, the potential benefits and the unfortunate potential complications of any treatment that would be available for palliation.  At this point based on his wishes, his statements as well as his clinical status I think the best thing here would be for him to take advantage of a transition to comfort and hospice care.  1 of his priority is to get home and I think that hospice could help facilitate this.  I explained my thoughts and this opinion to his wife and the patient at bedside.  They are going to contemplate their options.  Falls. 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  Some potential risk given the fact that he has not had MRI brain yet, but CT head was reassuring.  If he elects for a transition to comfort care then I would not continue his anticoagulation   Labs   CBC: Recent Labs  Lab 04/23/2019 1012 05/21/19 0404 05/22/19 0502  WBC 4.0 4.6 8.5  NEUTROABS 3.6  --   --   HGB 14.1 14.2 13.2  HCT 43.3 42.6 40.3  MCV 106.4* 104.7* 106.1*  PLT 31* 30* 50*    Basic Metabolic Panel: Recent Labs  Lab 05/04/2019 1012 05/21/19 0404 05/22/19 0502  NA 144 142 147*  K 2.1* 2.8* 2.9*  CL 102 106 106  CO2 31 28 32  GLUCOSE 105* 126* 123*  BUN 35* 34* 44*  CREATININE 1.34* 1.29* 1.34*  CALCIUM 9.2 8.6* 8.7*  MG 2.2  --  2.0  PHOS  --   --  2.2*   GFR: Estimated Creatinine Clearance: 66 mL/min (A) (by C-G formula based on SCr of 1.34 mg/dL (H)). Recent Labs  Lab 05/09/2019 1012 05/06/2019 1812 05/21/19 0404 05/22/19 0502   PROCALCITON  --  8.63 9.79 12.29  WBC 4.0  --  4.6 8.5    Liver Function Tests: Recent Labs  Lab 04/24/2019 1012 05/22/19 0502  AST 24  --   ALT 28  --   ALKPHOS 47  --   BILITOT 1.0  --   PROT 5.5*  --   ALBUMIN 3.5 3.0*   No results for input(s): LIPASE, AMYLASE in the last 168 hours. No results for input(s): AMMONIA in the last 168 hours.  ABG No results found for: PHART, PCO2ART, PO2ART, HCO3, TCO2, ACIDBASEDEF, O2SAT   Coagulation Profile: No results for input(s): INR, PROTIME in the last 168 hours.  Cardiac Enzymes: No results for input(s): CKTOTAL, CKMB, CKMBINDEX, TROPONINI in the last 168 hours.  HbA1C: HbA1c, POC (prediabetic range)  Date/Time Value Ref Range Status  07/23/2018 03:37 PM 0 (A) 5.7 - 6.4 % Final   HbA1c, POC (controlled diabetic range)  Date/Time Value Ref Range Status  07/23/2018 03:37 PM 0.0 0.0 - 7.0 % Final    Comment:    `   HbA1c POC (<> result, manual entry)  Date/Time Value Ref Range Status  07/23/2018 03:37 PM 0 4.0 - 5.6 % Final   Hgb A1c MFr Bld  Date/Time Value Ref Range Status  05/08/2019 07:57 PM 5.6 4.8 - 5.6 % Final    Comment:    (  NOTE)         Prediabetes: 5.7 - 6.4         Diabetes: >6.4         Glycemic control for adults with diabetes: <7.0   01/24/2019 10:47 AM 6.2 4.6 - 6.5 % Final    Comment:    Glycemic Control Guidelines for People with Diabetes:Non Diabetic:  <6%Goal of Therapy: <7%Additional Action Suggested:  >8%     CBG: Recent Labs  Lab 05/21/19 1222 05/21/19 1655 05/21/19 2132 05/22/19 0804 05/22/19 1138  GLUCAP 125* 124* 115* 113* 127*     Baltazar Apo, MD, PhD 05/22/2019, 1:30 PM [facility] [phone] or if no answer [phone]

## 2019-05-22 NOTE — Progress Notes (Addendum)
ANTICOAGULATION CONSULT NOTE - Follow-up Consult  Pharmacy Consult for Heparin Indication: SVC thrombus  Allergies  Allergen Reactions  . Latex Swelling  . Oxycodone Other (See Comments)    Sedation/somnolence    Patient Measurements: Height: 6\' 1"  (185.4 cm) Weight: 226 lb (102.5 kg) IBW/kg (Calculated) : 79.9 Heparin Dosing Weight: 100.7 kg  Vital Signs: Temp: 98 F (36.7 C) (09/30 2353) Temp Source: Oral (09/30 2353) BP: 159/66 (09/30 2353) Pulse Rate: 101 (09/30 2300)  Labs: Recent Labs    04/30/2019 1012  05/21/19 0404 05/21/19 1602 05/22/19 0017  HGB 14.1  --  14.2  --   --   HCT 43.3  --  42.6  --   --   PLT 31*  --  30*  --   --   HEPARINUNFRC  --    < > 0.88* 0.89* 0.64  CREATININE 1.34*  --  1.29*  --   --    < > = values in this interval not displayed.    Estimated Creatinine Clearance: 68 mL/min (A) (by C-G formula based on SCr of 1.29 mg/dL (H)).  Assessment: 68 y.o. male presenting with weakness. He continues on IV heparin for possible thrombus at the brachiocephalic confluence or tumor thrombus of the SVC.  Heparin level came back therapeutic at 0.64, on 1300 units/hr. No s/sx of bleeding or infusion issues per nursing. CBC stable on last check - plt low but stable.   Goal of Therapy:   Heparin level 0.3-0.7 units/ml Monitor platelets by anticoagulation protocol: Yes   Plan:  Continue heparin infusion at 1300 units/hr Confirm heparin level in 6 hours Monitor daily HL, CBC, and for s/sx of bleeding  Antonietta Jewel, PharmD, BCCCP Clinical Pharmacist  Phone: [phone]  Please check AMION for all [place] phone numbers After 10:00 PM, call [place] [phone] 05/22/2019 1:27 AM  ADDENDUM Nursing now reporting hematuria - will reduce the rate to 1250 units/hr and get level in 6 hr. Will continue to monitor hematuria.  Antonietta Jewel, PharmD, [place] Clinical Pharmacist

## 2019-05-22 NOTE — Telephone Encounter (Signed)
Called spoke with patient's wife. She states that they are in the hospital. Dr. Lamonte Sakai came by but she missed him. She would like to talk with him. I told her to let the nurses know. I was checking to see if Dr. Lamonte Sakai had checked in and would send this to [facility] for Dr. Lamonte Sakai if he hadn't by Monday when he came into the office , to have him call her.  Eric [name] voiced understanding and stated she would ask the nurses if [facility] would be rounding today .  Nothing further needed at this time.

## 2019-05-22 NOTE — Consult Note (Signed)
   [facility] Inpatient Consult   05/22/2019  HISAO DOO 16-Jan-1951 [id number]  [place] [employer]  This patient was a referral request from MD office and assigned this patient for outreach without successful call attempts per [place] notes.   Chart review reveals and from MD brief narrative from progress notes includes but not limited to:  Eric Odriscoll Lewisis a 68 y.o.malewith medical history significant ofHTN, HLD, COPD, CVA,diabetes mellitus type 2,DVT, remote history of tobacco use, arthritis, and morbid obesity; who presents with complaints ofworsening weakness.   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 Cathlean Cower, [place].  Plan:  Follow progress notes from inpatient [facility] and Palliative consult follow up noted for determining [facility] post hospital status accordingly.   [name], RN BSN [place] Liaison  [phone] business mobile phone Toll free office [phone]  Fax number: [phone] [email or web address]

## 2019-05-22 NOTE — Progress Notes (Signed)
SLP Cancellation Note  Patient Details Name: Eric [name] MRN: [id number] DOB: 1951-07-29   Cancelled treatment:       Reason Eval/Treat Not Completed: Medical issues which prohibited therapy - Pt seen in concert with RN. Pt currently struggling to breathe on NRB. Weak, nonproductive cough noted. Pt deemed inappropriate to proceed with any po presentations at this time. Recommend strict NPO. Will continue efforts.  Izabel Chim B. [facility], CCC-SLP Speech Language Pathologist [phone]  Shonna Chock 05/22/2019, 10:16 AM

## 2019-05-22 NOTE — Progress Notes (Signed)
Called MD Marylyn Ishihara due to urine in patient's condom cath bag becoming darker. MD gave verbal order to stop heparin. Pt still using accessory muscles to breath, has non rebreather mask over his mouth, SpO2 92%. Says his breathing is "okay" and rates his pain 6/10. Says his pain is "okay". Declined pain medication at this time. Sitter and spouse at bedside. Will continue to monitor.

## 2019-05-22 NOTE — Telephone Encounter (Signed)
Thank you, I was able to speak with her.

## 2019-05-22 DEATH — deceased

## 2019-05-23 LAB — GLUCOSE, CAPILLARY: Glucose-Capillary: 118 mg/dL — ABNORMAL HIGH (ref 70–99)

## 2019-05-23 MED ORDER — MORPHINE 100MG IN NS 100ML (1MG/ML) PREMIX INFUSION
2.0000 mg/h | INTRAVENOUS | Status: DC
  Administered 2019-05-23: 2 mg/h via INTRAVENOUS
  Filled 2019-05-23: qty 100

## 2019-05-23 MED ORDER — LORAZEPAM 2 MG/ML IJ SOLN
1.0000 mg | Freq: Four times a day (QID) | INTRAMUSCULAR | Status: DC | PRN
  Administered 2019-05-23: 1 mg via INTRAVENOUS
  Filled 2019-05-23 (×2): qty 1

## 2019-05-23 MED ORDER — MORPHINE BOLUS VIA INFUSION
2.0000 mg | INTRAVENOUS | Status: DC | PRN
  Filled 2019-05-23: qty 2

## 2019-05-24 ENCOUNTER — Other Ambulatory Visit: Payer: Self-pay | Admitting: *Deleted

## 2019-05-24 NOTE — Patient Outreach (Signed)
[facility]) Care Management  05/24/2019  Eric [name] 1951-08-21 [id number]   Referral was initially received from MD office due to frequent falls and COPD, however member has not passed away while under comfort care in the hospital.  Will close case at this time.  [name], [place], MSN [place] [phone]

## 2019-06-22 NOTE — Progress Notes (Signed)
Daily Progress Note   Patient Name: Eric [name]       Date: 28-May-2019 DOB: [birth year]  Age: 68 y.o. MRN#: [id number] Attending Physician: Jonnie Finner, DO Primary Care Physician: Biagio Borg, MD Admit Date: 05/13/2019  Reason for Consultation/Follow-up: Establishing goals of care  Subjective: Patient is actively dying, not awake, not alert He is on NRB He has been given PRN Morphine throughout Wife and son Mortimer Fries holding vigil at bedside See below  Length of Stay: 3  Current Medications: Scheduled Meds:  . clonazePAM  0.25 mg Oral QHS  . cloNIDine  0.1 mg Transdermal Weekly  . hydrALAZINE  10 mg Intravenous Q8H  . metoprolol tartrate  5 mg Intravenous Q6H  . nicotine  21 mg Transdermal Daily  . sodium chloride flush  10-40 mL Intracatheter Q12H  . sodium chloride flush  3 mL Intravenous Q12H    Continuous Infusions: . ceFEPime (MAXIPIME) IV 2 g (2019-05-28 0610)  . morphine      PRN Meds: acetaminophen **OR** acetaminophen, albuterol, morphine injection, morphine **AND** morphine, polyvinyl alcohol, sodium chloride flush  Physical Exam         68 year old gentleman resting up in bed  not as alert  Diminished breath sounds increased work of breathing on nonrebreather mask S1-S2 distant Has bilateral edema   Vital Signs: BP (!) 81/45 (BP Location: Right Arm)   Pulse 92   Temp 98.2 F (36.8 C) (Oral)   Resp (!) 27   Ht 6\' 1"  (1.854 m)   Wt 104.5 kg   SpO2 (!) 85%   BMI 30.40 kg/m  SpO2: SpO2: (!) 85 % O2 Device: O2 Device: NRB O2 Flow Rate: O2 Flow Rate (L/min): 15 L/min  Intake/output summary:   Intake/Output Summary (Last 24 hours) at 2019-05-28 0756 Last data filed at 05/22/2019 2216 Gross per 24 hour  Intake 1102.11 ml  Output 600 ml  Net 502.11 ml   LBM: Last BM Date: (PTA) Baseline Weight: Weight: 102.5 kg Most recent weight: Weight: 104.5 kg       Palliative Assessment/Data:      Patient Active Problem List   Diagnosis Date Noted  . Palliative care by specialist   . Goals of care, counseling/discussion   . Delirium due to another medical condition   . Acute respiratory failure  with hypoxia ([place]) 04/28/2019  . Thrombus 05/04/2019  . Mass of right lung 05/01/2019  . Mediastinal mass 05/08/2019  . Degenerative arthritis of knee, bilateral 01/24/2019  . Recurrent falls 01/24/2019  . Gait disorder 01/05/2018  . Leg wound, left 01/04/2018  . Open leg wound 01/04/2018  . Cellulitis of right leg 08/31/2017  . Dyspnea 08/31/2017  . Peripheral edema 08/31/2017  . Morbid obesity ([place]) 12/21/2015  . Lower back pain 06/22/2015  . Cough 06/22/2015  . COPD exacerbation ([place]) 06/22/2015  . Blurred vision, left eye 06/22/2015  . Encounter for well adult exam with abnormal findings 02/10/2015  . Anxiety and depression 01/27/2015  . COPD (chronic obstructive pulmonary disease) ([place]) 01/27/2015  . Dyslipidemia 01/27/2015  . 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[place]) 12/45/8099  . Thrombocytopenia ([place]) 01/27/2015  . Hypokalemia 01/27/2015  . AAA (abdominal aortic aneurysm) ([place]) 08/28/2014  . Lumbar radiculopathy 07/20/2014  . Bilateral lower extremity edema 06/29/2014  . Bilateral knee pain 05/31/2014  . Claudication ([place]) 06/02/2011  . Obstructive sleep apnea 06/24/2007  . Essential hypertension 06/24/2007  . 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   Comfort cart for family  Patient likely not stable for transfer to residential hospice anymore,recommend continuation of comfort care here  D/C PO medications  Family declines chaplain visit. Continue to support them.   Anticipated hospital death.   Prognosis hours to some very limited number of days.    Goals of Care and Additional Recommendations:  Limitations on Scope of Treatment: Full Comfort Care  Code Status:    Code Status Orders  (From admission, onward)         Start     Ordered   05/09/2019 1641  Do not attempt resuscitation (DNR)  Continuous    Question Answer Comment  In the event of cardiac or respiratory ARREST Do not call a "code blue"   In the event of cardiac or respiratory ARREST Do not perform Intubation, CPR, defibrillation or ACLS   In the event of cardiac or respiratory ARREST Use medication by any route, position, wound care, and other measures to relive pain and suffering. May use oxygen, suction and manual treatment of airway obstruction as needed for comfort.      05/06/2019 1642        Code Status History    Date Active Date Inactive Code Status Order ID Comments User Context   05/08/2019 1848 05/09/2019 0055 DNR [id number]  Kayleen Memos, DO ED   01/27/2015 1637 02/03/2015 1946 Full Code [id number]  Robbie Lis, MD ED   Advance Care Planning Activity       Prognosis:   hours to days.   Discharge Planning:  Anticipated hospital death.   Care plan was discussed with son and wife.   Thank you for allowing the Palliative Medicine Team to assist in the care of this patient.   Time In: 7 Time Out: 7.35 Total Time 35 Prolonged Time Billed  no       Greater than 50%  of this time was spent counseling and coordinating care related to the above assessment and plan.  Loistine Chance, MD [phone]  Please contact Palliative Medicine Team phone at [phone] for questions and concerns.

## 2019-06-22 NOTE — Progress Notes (Signed)
SLP Cancellation Note  Patient Details Name: YUNUS STOKLOSA MRN: [id number] DOB: 1950-10-13   Cancelled treatment:       Reason Eval/Treat Not Completed: Other (comment). Pt transitioned to comfort care. Will sign off at this time   Lynann Beaver May 28, 2019, 8:04 AM

## 2019-06-22 NOTE — Death Summary Note (Signed)
Death Summary  Date of Admission: 04/27/2019 Date of Expiration: 06/06/2019, 1927hrs  HPI (Per H&P from 05/16/2019): Eric [name] is a 68 y.o. male with medical history significant of HTN, HLD, COPD, CVA, diabetes mellitus type 2, DVT, remote history of tobacco use, arthritis, and morbid obesity; who presents with complaints of worsening weakness.  He was seen 2 weeks ago in the emergency department for cough and chest pain after having a fall at home.  CT scan of the head, chest, and abdomen revealed large right perihilar mass with mediastinal invasion, narrowing, and encasement of the right pulmonary artery and right hilar vessels.  Possible thrombus in the brachiocephalic confluence with marked narrowing and/or tumor thrombus of the SVC.  Suspicious airspace disease also noted.  Bilateral adrenal gland masses concerning for metastatic disease.  Indeterminate bilateral renal lesions with mild to moderate right hydronephrosis secondary to obstruction at the proximal right ureter at the level of the UPJ. Pulmonology had been consulted and it was recommended start patient on anticoagulation.  However, the patient left against medical advice.   Since being home patient reports that he has had multiple falls.  He complains of chest pain, worsening shortness of breath, lower extremity swelling, and persistent cough.  He reports that he has had progressive weight loss over last 6 months and shortness of breath over the last 3 months.  He had slid out of his chair this morning was unable to get back up.  Denies any injuries, but EMS was called thereafter by his wife.  Denies having any injuries.  In route with EMS patient was given 1 sublingual nitroglycerin and his blood pressures were noted to be elevated into the 200s.  He does admit to still smoking half pack cigarettes per day on average.  Course:  Acute respiratory failure with hypoxia secondary to right perihilar mass(suspected primary lung cancer),  metastatic disease: - Patient presents with worsening cough and shortness of breath.  - He was initially seen on 9/17 for progressively worsening cough found to have large perihilar mass with metastatic lesions, but had declined patient at that time. - Palliative care consulted; pt/family wishing to go forward with Bx - MRI brain ordered, but unable to tolerate lying flat - Check sputum culture and procalcitonin - appreciate pulm assistance - BiPap?     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 - Heparin per pharmacy     - stop heparin; now comfort care  Hypokalemia Hypophosphatemia Hypertensive urgency:  Renal insufficiency, renal hydronephrosis COPD:  Congestive heart failure Depression Thrombocytopenia Diabetes mellitus type 2:  Dyslipidemia Tobacco use AAA Dysphagia RUL PNA  After discussion with pulmonology, palliative care, and myself regard his widely metastatic lung cancer and ultimate prognosis; family and patient elected to change to comfort care measures. All aggressive interventions were held at that point.   Death Note: Staff noted that Eric [name] was no longer responding upon examination. Charge nurse and on-call team were notified. TOD is recorded at 1927 hrs, 06/06/19.   Marland KitchenJonnie Finner, DO

## 2019-06-22 NOTE — Care Management Important Message (Signed)
Important Message  Patient Details  Name: Eric [name] MRN: [id number] Date of Birth: June 01, 1951   Medicare Important Message Given:  Yes     [name] May 30, 2019, 8:33 AM

## 2019-06-22 NOTE — Care Management Important Message (Signed)
Important Message  Patient Details  Name: Eric [name] MRN: [id number] Date of Birth: Mar 13, 1951   Medicare Important Message Given:  Yes     [name] 06/01/2019, 2:54 PM

## 2019-06-22 NOTE — Progress Notes (Signed)
[facility]  PROGRESS NOTE    Eric [name]  HBZ:[id number] DOB: Dec 28, 1950 DOA: 05/07/2019 PCP: Biagio Borg, MD   Brief Narrative:   Eric [name] a 68 y.o.malewith medical history significant ofHTN, HLD, COPD, CVA,diabetes mellitus type 2,DVT, remote history of tobacco use, arthritis, and morbid obesity; who presents with complaints ofworsening weakness.  He was seen 2weeksago in the emergency department for cough and chest pain after having a fall at home. CT scan of the head,chest,and abdomen revealed large right perihilar mass with mediastinal invasion,narrowing, and encasement of the right pulmonary artery and right hilar vessels. Possible thrombus in the brachiocephalic confluence with marked narrowing and/or tumor thrombus of the SVC. Suspicious airspace disease also noted.Bilateral adrenal gland masses concerning for metastatic disease. Indeterminate bilateral renal lesions with mild to moderaterighthydronephrosis secondary to obstruction at the proximal right ureterat the level of the UPJ. Pulmonology had been consulted and it was recommended start patient on anticoagulation. However,the patient left against medical advice.  Since being home patient reports that he has had multiple falls. He complains of chest pain, worsening shortness of breath, lower extremity swelling, and persistent cough. He reports that he has had progressive weight loss over last 6 months and shortness of breath over the last 3 months. He had slid out of his chair this morning was unable to get back up. Denies any injuries, but EMS was called thereafter by his wife. Denies having any injuries. In route with EMS patient was given 1 sublingual nitroglycerin and his blood pressures were noted to be elevated into the 200s. He does admit to still smoking half pack cigarettes per day on average.  9/30: Family talked with PC. Still wish to have Bx performed 10/1: Still on NRB. Hematuria noted ON.  BP improved, but room for more improvement. 10/2: Family has elected comfort care measures. Expect in-hospital death.   Assessment & Plan:   Principal Problem:   Acute respiratory failure with hypoxia (HCC) Active Problems:   Bilateral lower extremity edema   AAA (abdominal aortic aneurysm) (H[facility])   COPD (chronic obstructive pulmonary disease) (H[facility])   Thrombocytopenia ([facility])   Hypokalemia   Thrombus   Mass of right lung   Palliative care by specialist   Goals of care, counseling/discussion   Delirium due to another medical condition   Acute respiratory failure with hypoxia secondary to right perihilar mass(suspected primary lung cancer), metastatic disease: - Patient presents with worsening cough and shortness of breath.  - He was initially seen on 9/17 for progressively worsening cough found to have large perihilar mass with metastatic lesions, but had declined patient at that time. - Palliative care consulted; pt/family wishing to go forward with Bx - MRI brain ordered, but unable to tolerate lying flat - Check sputum culture and procalcitonin - appreciate pulm assistance - BiPap?     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 - Heparin per pharmacy     - stop heparin; now comfort care  Hypokalemia Hypophosphatemia Hypertensive urgency:  Renal insufficiency, renal hydronephrosis COPD:  Congestive heart failure Depression Thrombocytopenia Diabetes mellitus type 2:  Dyslipidemia Tobacco use AAA Dysphagia RUL PNA  After family review with PCCM and PC; family/pt have elected comfort care measures. Expect in-house expiration. Stop lab draws and aggressive care.  DVT prophylaxis: None, CC measures Code Status: DNR/Comfort Care Family Communication: With wife at  bedside  Consultants:   PCCM  PC    ROS:  Unable to obtain d/t mentation  Subjective: No acute events ON.  Objective: Vitals:   17-Jun-2019 0400 2019/06/17 0500 06-17-19 0600 2019-06-17 0700  BP: (!) 106/57 100/62 (!) 91/46 (!) 81/45  Pulse: (!) 101 96 92 92  Resp: (!) 29 (!) 29 (!) 27 (!) 27  Temp: 98 F (36.7 C)   98.2 F (36.8 C)  TempSrc: Axillary   Oral  SpO2: (!) 85% (!) 83% (!) 85% (!) 85%  Weight:      Height:        Intake/Output Summary (Last 24 hours) at 06/17/19 0835 Last data filed at 05/22/2019 2216 Gross per 24 hour  Intake 1102.11 ml  Output 600 ml  Net 502.11 ml   Filed Weights   04/25/2019 0957 05/22/19 0500  Weight: 102.5 kg 104.5 kg    Examination:  General: 68 y.o. male resting in bed; ill appearing Cardiovascular: RRR, +S1, S2, no m/g/r, equal pulses throughout Respiratory: upper airway transmission, course sounds, increased WOB w/ accessory muscle use GI: BS hypoactive, obese, NDNT, no masses noted, no organomegaly noted MSK: No c/c; BLE edema Neuro: somenolent; responsive to noxious stimuli   Data Reviewed: I have personally reviewed following labs and imaging studies.  CBC: Recent Labs  Lab 04/30/2019 1012 05/21/19 0404 05/22/19 0502  WBC 4.0 4.6 8.5  NEUTROABS 3.6  --   --   HGB 14.1 14.2 13.2  HCT 43.3 42.6 40.3  MCV 106.4* 104.7* 106.1*  PLT 31* 30* 50*   Basic Metabolic Panel: Recent Labs  Lab 05/09/2019 1012 05/21/19 0404 05/22/19 0502  NA 144 142 147*  K 2.1* 2.8* 2.9*  CL 102 106 106  CO2 31 28 32  GLUCOSE 105* 126* 123*  BUN 35* 34* 44*  CREATININE 1.34* 1.29* 1.34*  CALCIUM 9.2 8.6* 8.7*  MG 2.2  --  2.0  PHOS  --   --  2.2*   GFR: Estimated Creatinine Clearance: 66 mL/min (A) (by C-G formula based on SCr of 1.34 mg/dL (H)). Liver Function Tests: Recent Labs  Lab 04/26/2019 1012 05/22/19 0502  AST 24  --   ALT 28  --   ALKPHOS 47  --   BILITOT 1.0  --   PROT 5.5*  --   ALBUMIN 3.5 3.0*   No results  for input(s): LIPASE, AMYLASE in the last 168 hours. No results for input(s): AMMONIA in the last 168 hours. Coagulation Profile: No results for input(s): INR, PROTIME in the last 168 hours. Cardiac Enzymes: No results for input(s): CKTOTAL, CKMB, CKMBINDEX, TROPONINI in the last 168 hours. BNP (last 3 results) No results for input(s): PROBNP in the last 8760 hours. HbA1C: No results for input(s): HGBA1C in the last 72 hours. CBG: Recent Labs  Lab 05/22/19 0804 05/22/19 1138 05/22/19 1646 05/22/19 2124 Jun 17, 2019 0712  GLUCAP 113* 127* 137* 145* 118*   Lipid Profile: No results for input(s): CHOL, HDL, LDLCALC, TRIG, CHOLHDL, LDLDIRECT in the last 72 hours. Thyroid Function Tests: No results for input(s): TSH, T4TOTAL, FREET4, T3FREE, THYROIDAB in the last 72 hours. Anemia Panel: No results for input(s): VITAMINB12, FOLATE, FERRITIN, TIBC, IRON, RETICCTPCT in the last 72 hours. Sepsis Labs: Recent Labs  Lab 05/07/2019 1812 05/21/19 0404 05/22/19 0502  PROCALCITON 8.63 9.79 12.29    Recent Results (from the past 240 hour(s))  SARS Coronavirus 2 [facility] order, Performed in [facility] hospital lab) Nasopharyngeal Nasopharyngeal Swab     Status:  None   Collection Time: 05/01/2019 12:46 PM   Specimen: Nasopharyngeal Swab  Result Value Ref Range Status   SARS Coronavirus 2 NEGATIVE NEGATIVE Final    Comment: (NOTE) If result is NEGATIVE SARS-CoV-2 target nucleic acids are NOT DETECTED. The SARS-CoV-2 RNA is generally detectable in upper and lower  respiratory specimens during the acute phase of infection. The lowest  concentration of SARS-CoV-2 viral copies this assay can detect is 250  copies / mL. A negative result does not preclude SARS-CoV-2 infection  and should not be used as the sole basis for treatment or other  patient management decisions.  A negative result may occur with  improper specimen collection / handling, submission of specimen other  than nasopharyngeal  swab, presence of viral mutation(s) within the  areas targeted by this assay, and inadequate number of viral copies  (<250 copies / mL). A negative result must be combined with clinical  observations, patient history, and epidemiological information. If result is POSITIVE SARS-CoV-2 target nucleic acids are DETECTED. The SARS-CoV-2 RNA is generally detectable in upper and lower  respiratory specimens dur ing the acute phase of infection.  Positive  results are indicative of active infection with SARS-CoV-2.  Clinical  correlation with patient history and other diagnostic information is  necessary to determine patient infection status.  Positive results do  not rule out bacterial infection or co-infection with other viruses. If result is PRESUMPTIVE POSTIVE SARS-CoV-2 nucleic acids MAY BE PRESENT.   A presumptive positive result was obtained on the submitted specimen  and confirmed on repeat testing.  While 2019 novel coronavirus  (SARS-CoV-2) nucleic acids may be present in the submitted sample  additional confirmatory testing may be necessary for epidemiological  and / or clinical management purposes  to differentiate between  SARS-CoV-2 and other Sarbecovirus currently known to infect humans.  If clinically indicated additional testing with an alternate test  methodology [phone]) is advised. The SARS-CoV-2 RNA is generally  detectable in upper and lower respiratory sp ecimens during the acute  phase of infection. The expected result is Negative. Fact Sheet for Patients:  StrictlyIdeas.no Fact Sheet for Healthcare Providers: BankingDealers.co.za This test is not yet approved or cleared by the [place] FDA and has been authorized for detection and/or diagnosis of SARS-CoV-2 by FDA under an Emergency Use Authorization (EUA).  This EUA will remain in effect (meaning this test can be used) for the duration of the COVID-19 declaration  under Section 564(b)(1) of the Act, 21 U.S.C. section 360bbb-3(b)(1), unless the authorization is terminated or revoked sooner. Performed at [facility], [place] [address], [place], [place] [address]   MRSA PCR Screening     Status: None   Collection Time: 05/22/19  9:45 AM   Specimen: Nasal Mucosa; Nasopharyngeal  Result Value Ref Range Status   MRSA by PCR NEGATIVE NEGATIVE Final    Comment:        The GeneXpert MRSA Assay (FDA approved for NASAL specimens only), is one component of a comprehensive MRSA colonization surveillance program. It is not intended to diagnose MRSA infection nor to guide or monitor treatment for MRSA infections. Performed at [facility], [place] [address], [place], [place] [address]       Radiology Studies: No results found.   Scheduled Meds: . clonazePAM  0.25 mg Oral QHS  . cloNIDine  0.1 mg Transdermal Weekly  . hydrALAZINE  10 mg Intravenous Q8H  . metoprolol tartrate  5 mg Intravenous Q6H  .  nicotine  21 mg Transdermal Daily  . sodium chloride flush  10-40 mL Intracatheter Q12H  . sodium chloride flush  3 mL Intravenous Q12H   Continuous Infusions: . ceFEPime (MAXIPIME) IV 2 g (June 05, 2019 0610)  . morphine       LOS: 3 days    Time spent: 25 minutes spent in the coordination of care today.    Jonnie Finner, DO [facility] Pager [phone]  If 7PM-7AM, please contact night-coverage [email or web address] Password [facility] June 05, 2019, 8:35 AM

## 2019-06-22 NOTE — Care Management (Signed)
TOC acknowledges consult for [facility] place.  Per Palliative note 10/2 - pt is no longer stable for transfer to residential hospice - hospital death anticipated.  TOC will continue to follow and can make arrangements for residential hospice if pt status changes.

## 2019-06-22 DEATH — deceased

## 2019-07-25 ENCOUNTER — Ambulatory Visit: Payer: Medicare Other | Admitting: Internal Medicine

## 2019-08-17 LAB — PULMONARY FUNCTION TEST
DL/VA % pred: 93 %
DL/VA: 4.34 ml/min/mmHg/L
DLCO unc % pred: 79 %
DLCO unc: 25.76 ml/min/mmHg
FEF 25-75 Post: 3.65 L/sec
FEF 25-75 Pre: 3.55 L/sec
FEF2575-%Change-Post: 2 %
FEF2575-%Pred-Post: 128 %
FEF2575-%Pred-Pre: 125 %
FEV1-%Change-Post: 4 %
FEV1-%Pred-Post: 89 %
FEV1-%Pred-Pre: 85 %
FEV1-Post: 3.14 L
FEV1-Pre: 3 L
FEV1FVC-%Change-Post: -2 %
FEV1FVC-%Pred-Pre: 110 %
FEV6-%Change-Post: 7 %
FEV6-%Pred-Post: 87 %
FEV6-%Pred-Pre: 80 %
FEV6-Post: 3.87 L
FEV6-Pre: 3.6 L
FEV6FVC-%Change-Post: 0 %
FEV6FVC-%Pred-Post: 105 %
FEV6FVC-%Pred-Pre: 104 %
FVC-%Change-Post: 6 %
FVC-%Pred-Post: 82 %
FVC-%Pred-Pre: 77 %
FVC-Post: 3.87 L
Post FEV1/FVC ratio: 81 %
Post FEV6/FVC ratio: 100 %
Pre FEV1/FVC ratio: 83 %
Pre FEV6/FVC Ratio: 99 %
RV % pred: 121 %
RV: 2.8 L
TLC % pred: 87 %
TLC: 6.15 L

## 2020-11-02 IMAGING — CT CT HEAD W/O CM
4 series · 16 of 47 positions shown, 18 images · non-contrast
Comparison: Head CT 05/08/2019

CLINICAL DATA: Fell from chair at home.  Generalize weakness.

EXAM:
CT HEAD WITHOUT CONTRAST
TECHNIQUE: Contiguous axial images were obtained from the base of the skull
through the vertex without intravenous contrast.

[Series 3: head without · axial · non-contrast · 0.43mm/px · z∈[-80,+40]mm · 7 of 34 slices shown, 9 images]
[im 5/34  brain]
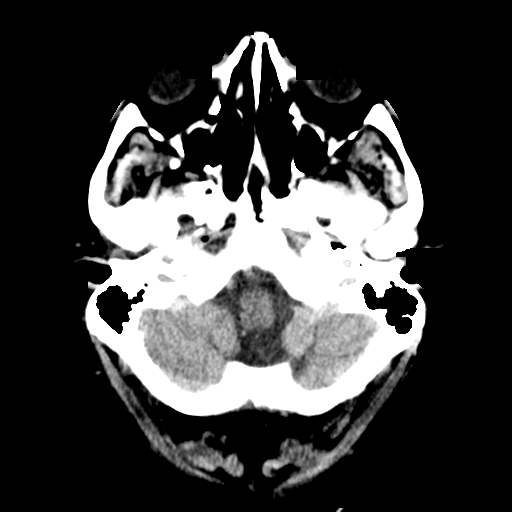
[im 5/34  bone]
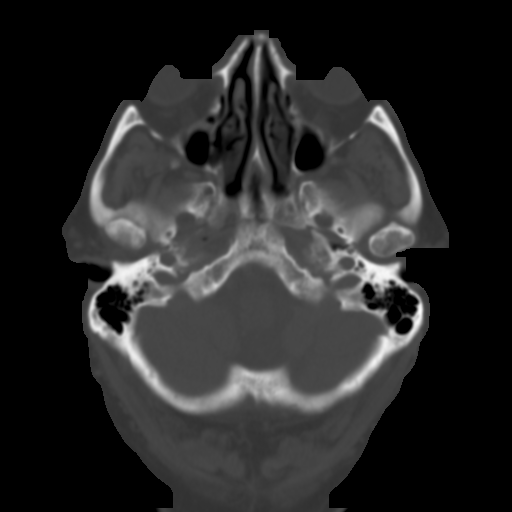
[im 9/34  brain]
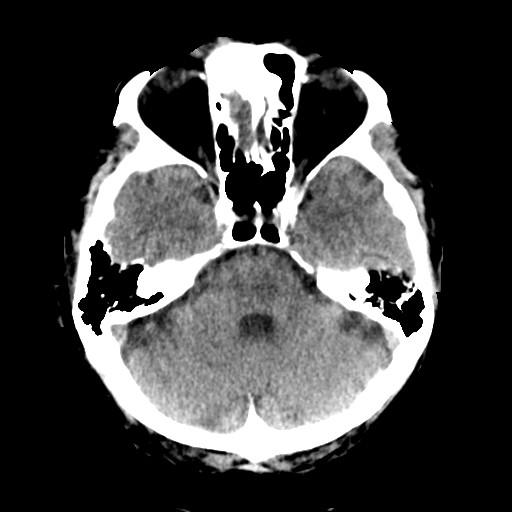
[im 13/34  brain]
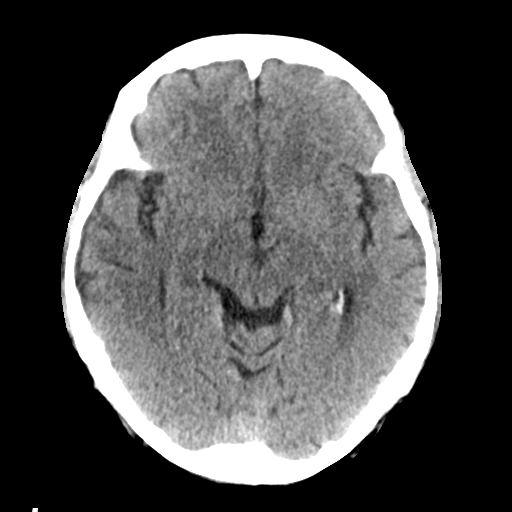
[im 17/34  brain]
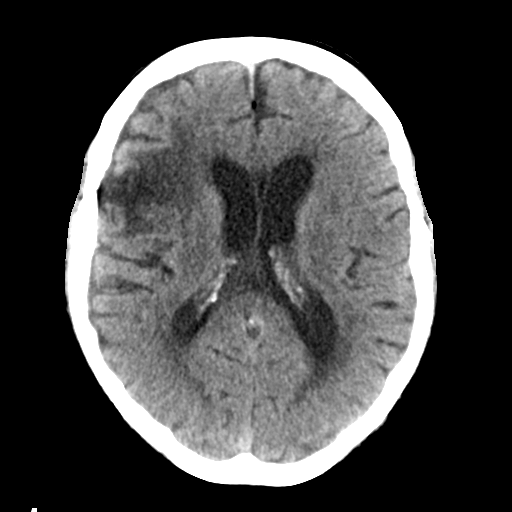
[im 21/34  brain]
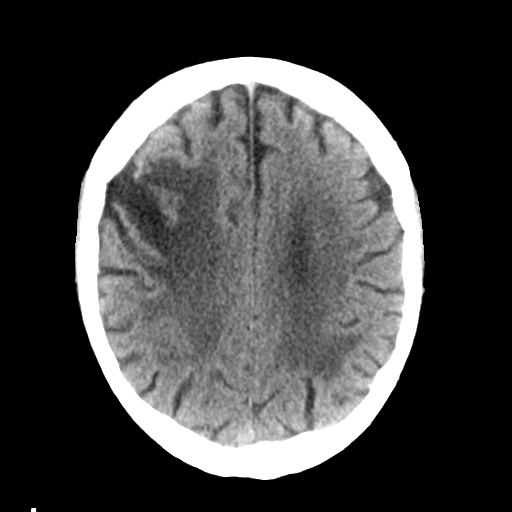
[im 21/34  bone]
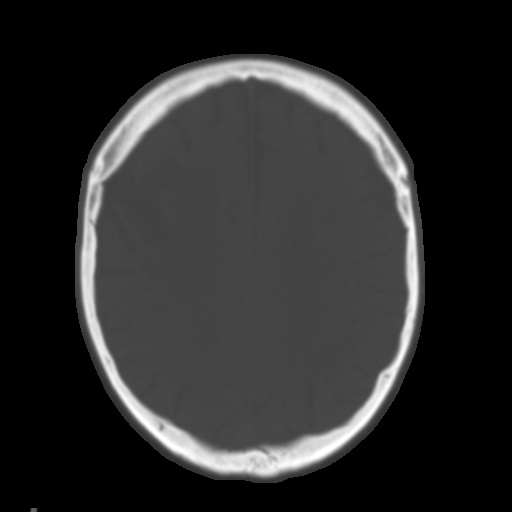
[im 25/34  brain]
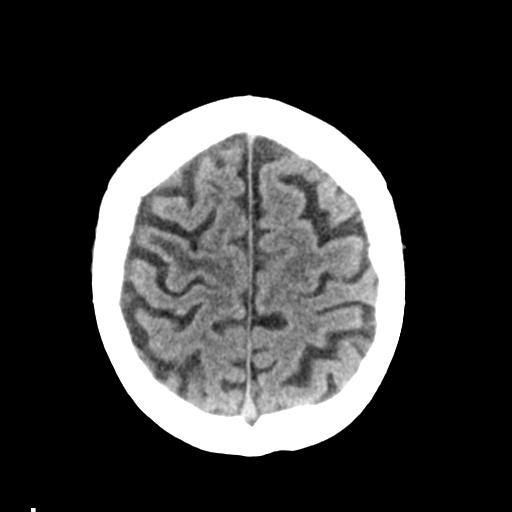
[im 29/34  brain]
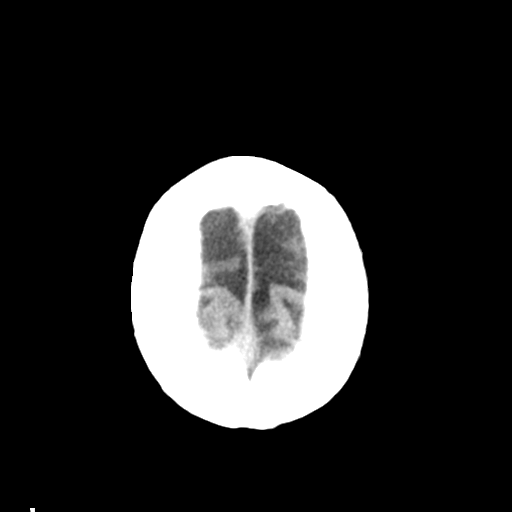

[Series 4: head bone · axial · 0.43mm/px · z∈[-84,-52]mm · 3 of 83 slices shown]
[im 9/83  bone]
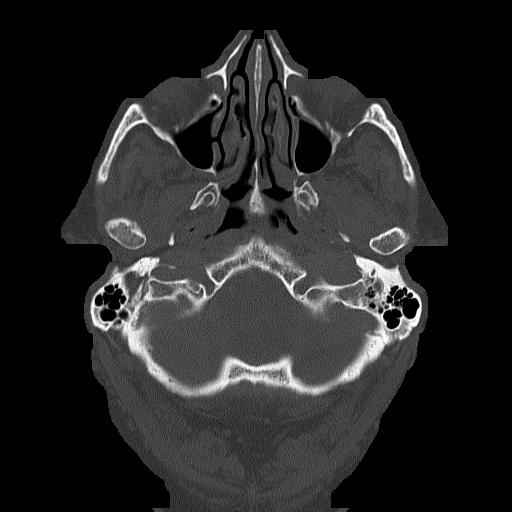
[im 17/83  bone]
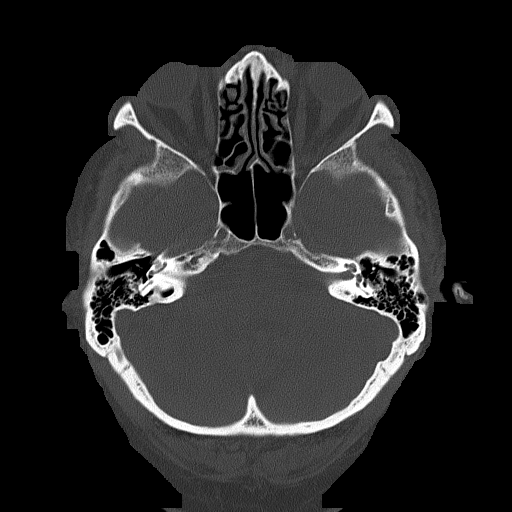
[im 25/83  bone]
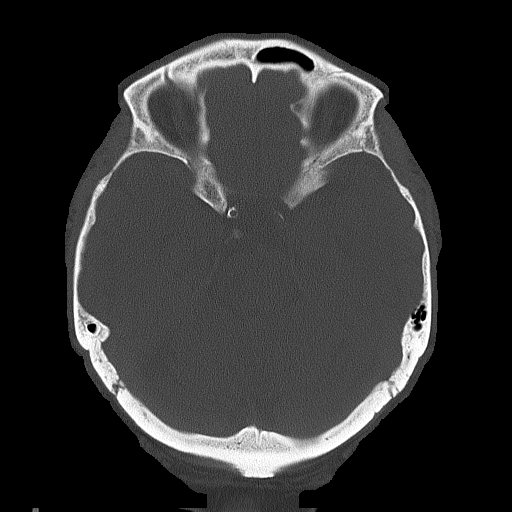

[Series 5: head without cor · coronal · non-contrast · 0.34mm/px · 3 of 71 slices shown]
[im 24/71  brain]
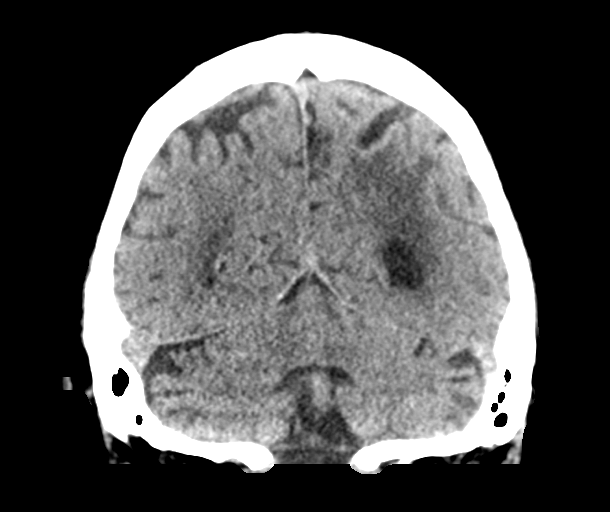
[im 32/71  brain]
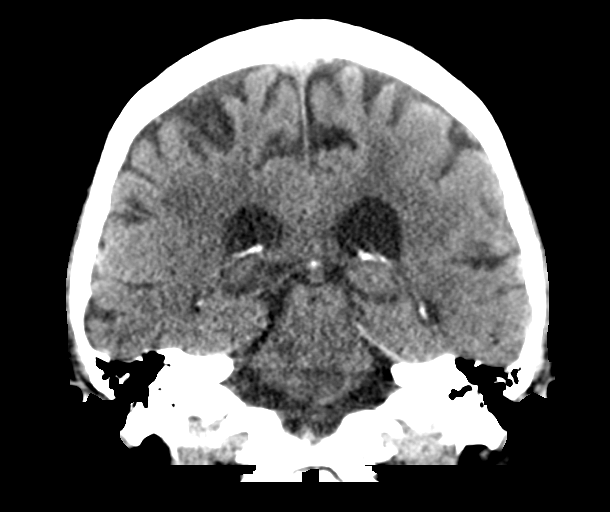
[im 39/71  brain]
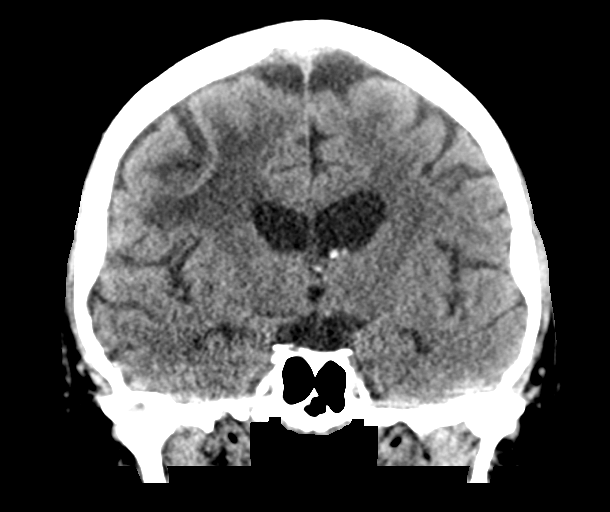

[Series 6: head without sag · sagittal · non-contrast · 0.34mm/px · 3 of 62 slices shown]
[im 21/62  brain]
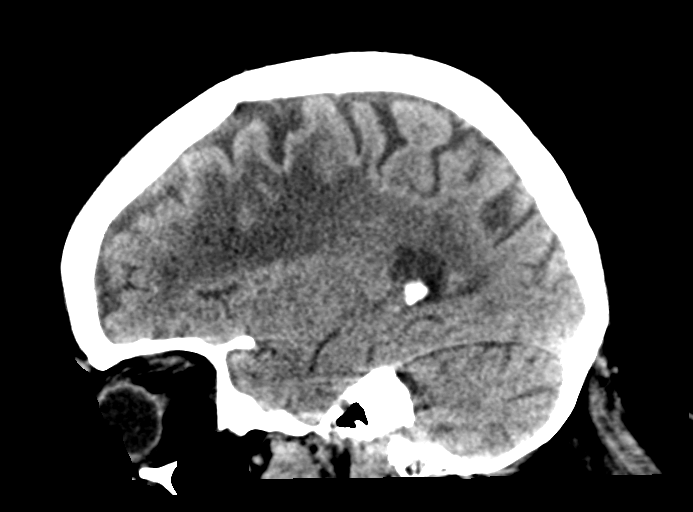
[im 31/62  brain]
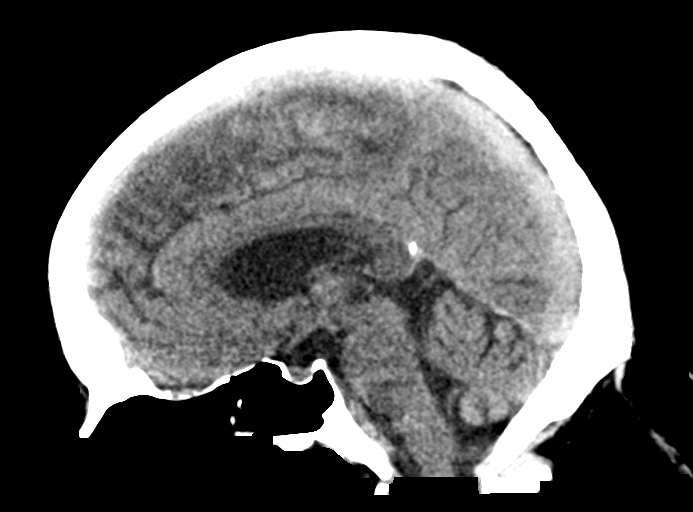
[im 41/62  brain]
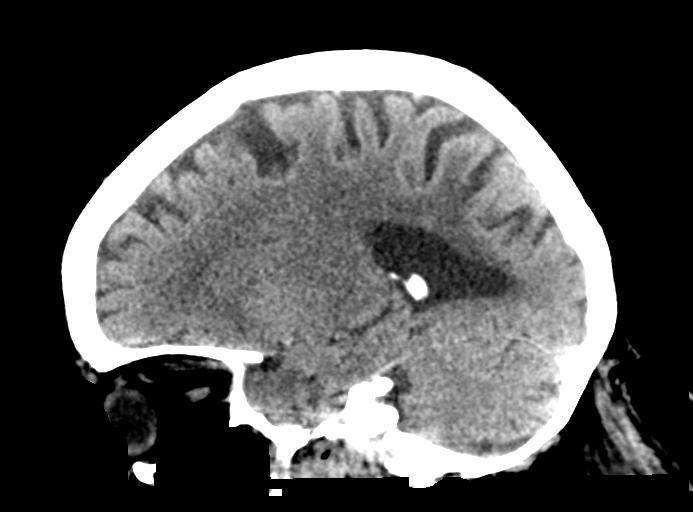

[16 of 47 positions shown; findings below may reference images not displayed]

FINDINGS: Brain: Remote MCA territory infarct on the right side with
encephalomalacia. There is also age advanced fairly extensive
periventricular white matter disease. The ventricles are in the
midline in are normal in configuration. No extra-axial fluid
collections are identified. No intracranial mass lesions. The
brainstem and cerebellum are grossly normal.

Vascular: Stable vascular calcifications. No definite aneurysm or
hyperdense vessels.

Skull: No skull fracture or bone lesions.

Sinuses/Orbits: The paranasal sinuses and mastoid air cells are
clear. The globes are intact.

Other: No scalp lesions or hematoma.
IMPRESSION: 1. Remote MCA territory infarct on the right side with
encephalomalacia.
2. Age advanced periventricular white matter disease.
3. No acute intracranial findings or mass lesions.
